# Patient Record
Sex: Female | Born: 1945
Health system: Southern US, Community
[De-identification: ages and names within clinical notes are randomized; demographics above are authoritative.]

## PROBLEM LIST (undated history)

## (undated) DIAGNOSIS — K219 Gastro-esophageal reflux disease without esophagitis: Secondary | ICD-10-CM

## (undated) DIAGNOSIS — M545 Low back pain, unspecified: Secondary | ICD-10-CM

## (undated) DIAGNOSIS — K824 Cholesterolosis of gallbladder: Secondary | ICD-10-CM

## (undated) DIAGNOSIS — K623 Rectal prolapse: Secondary | ICD-10-CM

## (undated) DIAGNOSIS — I480 Paroxysmal atrial fibrillation: Secondary | ICD-10-CM

## (undated) DIAGNOSIS — I10 Essential (primary) hypertension: Secondary | ICD-10-CM

## (undated) DIAGNOSIS — H43813 Vitreous degeneration, bilateral: Secondary | ICD-10-CM

## (undated) DIAGNOSIS — R740 Nonspecific elevation of levels of transaminase and lactic acid dehydrogenase [LDH]: Secondary | ICD-10-CM

## (undated) DIAGNOSIS — K279 Peptic ulcer, site unspecified, unspecified as acute or chronic, without hemorrhage or perforation: Secondary | ICD-10-CM

## (undated) DIAGNOSIS — R55 Syncope and collapse: Secondary | ICD-10-CM

## (undated) DIAGNOSIS — K449 Diaphragmatic hernia without obstruction or gangrene: Secondary | ICD-10-CM

## (undated) DIAGNOSIS — K2960 Other gastritis without bleeding: Secondary | ICD-10-CM

## (undated) HISTORY — DX: Age-related osteoporosis without current pathological fracture: M81.0

## (undated) HISTORY — DX: Syncope and collapse: R55

## (undated) HISTORY — DX: Cholesterolosis of gallbladder: K82.4

## (undated) HISTORY — PX: OOPHORECTOMY: SHX86

## (undated) HISTORY — DX: Peptic ulcer, site unspecified, unspecified as acute or chronic, without hemorrhage or perforation: K27.9

## (undated) HISTORY — DX: Paroxysmal atrial fibrillation: I48.0

## (undated) HISTORY — DX: Diaphragmatic hernia without obstruction or gangrene: K44.9

## (undated) HISTORY — DX: Other gastritis without bleeding: K29.60

## (undated) HISTORY — DX: Gastro-esophageal reflux disease without esophagitis: K21.9

## (undated) HISTORY — DX: Nonspecific elevation of levels of transaminase and lactic acid dehydrogenase (ldh): R74.0

## (undated) HISTORY — DX: Low back pain: M54.5

## (undated) HISTORY — DX: Low back pain, unspecified: M54.50

## (undated) HISTORY — DX: Vitreous degeneration, bilateral: H43.813

## (undated) HISTORY — DX: Rectal prolapse: K62.3

## (undated) HISTORY — DX: Essential (primary) hypertension: I10

## (undated) HISTORY — PX: HEMORRHOID BANDING: SHX5850

---

## 1979-10-04 HISTORY — PX: PARTIAL HYSTERECTOMY: SHX80

## 1995-07-20 ENCOUNTER — Encounter: Payer: Self-pay | Admitting: Family Medicine

## 1995-07-20 LAB — CONVERTED CEMR LAB
Blood Glucose, Fasting: 73 mg/dL
RBC count: 4.46 10*6/uL
WBC, blood: 7.1 10*3/uL

## 2001-09-24 ENCOUNTER — Encounter: Payer: Self-pay | Admitting: Family Medicine

## 2001-09-24 LAB — CONVERTED CEMR LAB: Blood Glucose, Fasting: 79 mg/dL

## 2003-06-20 ENCOUNTER — Encounter: Payer: Self-pay | Admitting: Family Medicine

## 2003-06-20 LAB — CONVERTED CEMR LAB: WBC, blood: 4.2 10*3/uL

## 2003-07-01 ENCOUNTER — Encounter: Admission: RE | Admit: 2003-07-01 | Discharge: 2003-07-01 | Payer: Self-pay | Admitting: Family Medicine

## 2003-07-01 ENCOUNTER — Encounter: Payer: Self-pay | Admitting: Family Medicine

## 2003-08-08 ENCOUNTER — Encounter: Payer: Self-pay | Admitting: Family Medicine

## 2003-08-08 LAB — CONVERTED CEMR LAB: Blood Glucose, Fasting: 81 mg/dL

## 2003-10-04 HISTORY — PX: RECTAL PROLAPSE REPAIR: SHX759

## 2004-04-01 LAB — FECAL OCCULT BLOOD, GUAIAC: Fecal Occult Blood: NEGATIVE

## 2005-01-04 ENCOUNTER — Ambulatory Visit: Payer: Self-pay | Admitting: Unknown Physician Specialty

## 2005-01-17 ENCOUNTER — Ambulatory Visit: Payer: Self-pay | Admitting: Family Medicine

## 2005-01-17 LAB — CONVERTED CEMR LAB
Blood Glucose, Fasting: 92 mg/dL
RBC count: 4.28 10*6/uL
TSH: 1.81 microintl units/mL
WBC, blood: 5 10*3/uL

## 2005-01-20 ENCOUNTER — Ambulatory Visit: Payer: Self-pay | Admitting: Family Medicine

## 2005-03-07 ENCOUNTER — Ambulatory Visit: Payer: Self-pay | Admitting: Unknown Physician Specialty

## 2005-05-06 ENCOUNTER — Ambulatory Visit: Payer: Self-pay | Admitting: Family Medicine

## 2006-01-23 ENCOUNTER — Ambulatory Visit: Payer: Self-pay | Admitting: Family Medicine

## 2006-03-23 ENCOUNTER — Ambulatory Visit: Payer: Self-pay | Admitting: Unknown Physician Specialty

## 2006-04-04 ENCOUNTER — Ambulatory Visit: Payer: Self-pay | Admitting: Family Medicine

## 2006-05-02 ENCOUNTER — Ambulatory Visit: Payer: Self-pay | Admitting: Family Medicine

## 2006-08-03 ENCOUNTER — Ambulatory Visit: Payer: Self-pay | Admitting: Family Medicine

## 2006-10-03 HISTORY — PX: TOTAL ABDOMINAL HYSTERECTOMY: SHX209

## 2006-10-11 ENCOUNTER — Ambulatory Visit: Payer: Self-pay | Admitting: Family Medicine

## 2006-11-20 ENCOUNTER — Ambulatory Visit: Payer: Self-pay | Admitting: Family Medicine

## 2006-12-03 HISTORY — PX: COLONOSCOPY: SHX174

## 2006-12-07 ENCOUNTER — Ambulatory Visit: Payer: Self-pay | Admitting: Internal Medicine

## 2006-12-18 ENCOUNTER — Ambulatory Visit: Payer: Self-pay | Admitting: Internal Medicine

## 2006-12-18 LAB — CONVERTED CEMR LAB
Basophils Relative: 0.5 % (ref 0.0–1.0)
Eosinophils Absolute: 0.3 10*3/uL (ref 0.0–0.6)
Monocytes Absolute: 0.5 10*3/uL (ref 0.2–0.7)
Monocytes Relative: 7.7 % (ref 3.0–11.0)
Platelets: 254 10*3/uL (ref 150–400)
RBC: 4.34 M/uL (ref 3.87–5.11)
RDW: 11.9 % (ref 11.5–14.6)

## 2007-02-02 ENCOUNTER — Ambulatory Visit: Payer: Self-pay | Admitting: Internal Medicine

## 2007-04-02 ENCOUNTER — Ambulatory Visit: Payer: Self-pay | Admitting: Internal Medicine

## 2007-04-04 ENCOUNTER — Ambulatory Visit: Payer: Self-pay | Admitting: Unknown Physician Specialty

## 2007-04-27 ENCOUNTER — Encounter: Payer: Self-pay | Admitting: Internal Medicine

## 2007-04-27 ENCOUNTER — Encounter: Payer: Self-pay | Admitting: Surgical

## 2007-04-27 ENCOUNTER — Ambulatory Visit: Payer: Self-pay | Admitting: Internal Medicine

## 2007-04-27 HISTORY — PX: ESOPHAGOGASTRODUODENOSCOPY: SHX1529

## 2007-10-15 ENCOUNTER — Ambulatory Visit: Payer: Self-pay | Admitting: Internal Medicine

## 2007-11-26 ENCOUNTER — Telehealth: Payer: Self-pay | Admitting: Family Medicine

## 2007-12-12 ENCOUNTER — Encounter: Payer: Self-pay | Admitting: Family Medicine

## 2007-12-12 DIAGNOSIS — I10 Essential (primary) hypertension: Secondary | ICD-10-CM

## 2007-12-17 ENCOUNTER — Ambulatory Visit: Payer: Self-pay | Admitting: Family Medicine

## 2008-02-02 DIAGNOSIS — K623 Rectal prolapse: Secondary | ICD-10-CM | POA: Insufficient documentation

## 2008-04-16 ENCOUNTER — Ambulatory Visit: Payer: Self-pay | Admitting: Unknown Physician Specialty

## 2008-05-06 ENCOUNTER — Telehealth: Payer: Self-pay | Admitting: Internal Medicine

## 2008-06-05 ENCOUNTER — Ambulatory Visit: Payer: Self-pay | Admitting: Family Medicine

## 2008-06-05 LAB — CONVERTED CEMR LAB
ALT: 47 units/L — ABNORMAL HIGH (ref 0–35)
Basophils Relative: 1.8 % (ref 0.0–3.0)
Bilirubin, Direct: 0.1 mg/dL (ref 0.0–0.3)
CO2: 29 meq/L (ref 19–32)
Calcium: 9.3 mg/dL (ref 8.4–10.5)
Cholesterol: 156 mg/dL (ref 0–200)
Eosinophils Relative: 2.1 % (ref 0.0–5.0)
Glucose, Bld: 85 mg/dL (ref 70–99)
LDL Cholesterol: 59 mg/dL (ref 0–99)
MCHC: 35.2 g/dL (ref 30.0–36.0)
Neutrophils Relative %: 56.9 % (ref 43.0–77.0)
Platelets: 221 10*3/uL (ref 150–400)
Potassium: 3.8 meq/L (ref 3.5–5.1)
RDW: 12 % (ref 11.5–14.6)
Sodium: 143 meq/L (ref 135–145)
Total Bilirubin: 0.6 mg/dL (ref 0.3–1.2)
Triglycerides: 76 mg/dL (ref 0–149)

## 2008-06-10 ENCOUNTER — Ambulatory Visit: Payer: Self-pay | Admitting: Family Medicine

## 2008-06-10 DIAGNOSIS — K449 Diaphragmatic hernia without obstruction or gangrene: Secondary | ICD-10-CM

## 2008-06-10 DIAGNOSIS — K2960 Other gastritis without bleeding: Secondary | ICD-10-CM | POA: Insufficient documentation

## 2008-10-03 DIAGNOSIS — I480 Paroxysmal atrial fibrillation: Secondary | ICD-10-CM

## 2008-10-03 HISTORY — DX: Paroxysmal atrial fibrillation: I48.0

## 2008-10-03 HISTORY — PX: CATARACT EXTRACTION: SUR2

## 2008-12-02 ENCOUNTER — Ambulatory Visit: Payer: Self-pay | Admitting: Family Medicine

## 2008-12-07 LAB — CONVERTED CEMR LAB: AST: 29 units/L (ref 0–37)

## 2008-12-08 ENCOUNTER — Ambulatory Visit: Payer: Self-pay | Admitting: Family Medicine

## 2009-03-20 ENCOUNTER — Encounter: Payer: Self-pay | Admitting: Family Medicine

## 2009-05-06 ENCOUNTER — Encounter: Payer: Self-pay | Admitting: Family Medicine

## 2009-06-30 LAB — CONVERTED CEMR LAB: Pap Smear: NORMAL

## 2009-07-08 ENCOUNTER — Ambulatory Visit: Payer: Self-pay | Admitting: Unknown Physician Specialty

## 2009-07-10 ENCOUNTER — Ambulatory Visit: Payer: Self-pay | Admitting: Family Medicine

## 2009-07-10 LAB — CONVERTED CEMR LAB
Bilirubin Urine: NEGATIVE
Specific Gravity, Urine: >=1.03
Urobilinogen, UA: 0.2

## 2009-07-13 ENCOUNTER — Ambulatory Visit: Payer: Self-pay | Admitting: Family Medicine

## 2009-07-13 DIAGNOSIS — IMO0002 Reserved for concepts with insufficient information to code with codable children: Secondary | ICD-10-CM | POA: Insufficient documentation

## 2009-08-07 ENCOUNTER — Inpatient Hospital Stay (HOSPITAL_COMMUNITY): Admission: EM | Admit: 2009-08-07 | Discharge: 2009-08-08 | Payer: Self-pay | Admitting: Emergency Medicine

## 2009-08-07 ENCOUNTER — Ambulatory Visit: Payer: Self-pay | Admitting: Internal Medicine

## 2009-08-07 ENCOUNTER — Encounter (INDEPENDENT_AMBULATORY_CARE_PROVIDER_SITE_OTHER): Payer: Self-pay | Admitting: Internal Medicine

## 2009-08-07 HISTORY — PX: OTHER SURGICAL HISTORY: SHX169

## 2009-08-11 ENCOUNTER — Ambulatory Visit (HOSPITAL_COMMUNITY): Admission: RE | Admit: 2009-08-11 | Discharge: 2009-08-11 | Payer: Self-pay | Admitting: Internal Medicine

## 2009-08-11 ENCOUNTER — Ambulatory Visit: Payer: Self-pay

## 2009-08-11 ENCOUNTER — Ambulatory Visit: Payer: Self-pay | Admitting: Internal Medicine

## 2009-08-11 ENCOUNTER — Encounter: Payer: Self-pay | Admitting: Internal Medicine

## 2009-08-18 ENCOUNTER — Ambulatory Visit: Payer: Self-pay | Admitting: Family Medicine

## 2009-08-21 DIAGNOSIS — Z8711 Personal history of peptic ulcer disease: Secondary | ICD-10-CM

## 2009-08-21 DIAGNOSIS — K219 Gastro-esophageal reflux disease without esophagitis: Secondary | ICD-10-CM | POA: Insufficient documentation

## 2009-08-21 DIAGNOSIS — R079 Chest pain, unspecified: Secondary | ICD-10-CM | POA: Insufficient documentation

## 2009-08-24 ENCOUNTER — Ambulatory Visit: Payer: Self-pay | Admitting: Internal Medicine

## 2009-08-24 DIAGNOSIS — I480 Paroxysmal atrial fibrillation: Secondary | ICD-10-CM | POA: Insufficient documentation

## 2009-09-29 ENCOUNTER — Ambulatory Visit: Payer: Self-pay | Admitting: Family Medicine

## 2009-09-29 LAB — CONVERTED CEMR LAB
BUN: 15 mg/dL (ref 6–23)
CO2: 29 meq/L (ref 19–32)
Calcium: 9.4 mg/dL (ref 8.4–10.5)
Creatinine, Ser: 0.7 mg/dL (ref 0.4–1.2)

## 2009-11-03 ENCOUNTER — Telehealth: Payer: Self-pay | Admitting: Internal Medicine

## 2009-12-01 ENCOUNTER — Ambulatory Visit: Payer: Self-pay | Admitting: Internal Medicine

## 2010-01-13 ENCOUNTER — Ambulatory Visit: Payer: Self-pay | Admitting: Family Medicine

## 2010-03-24 ENCOUNTER — Ambulatory Visit: Payer: Self-pay | Admitting: Internal Medicine

## 2010-03-29 ENCOUNTER — Telehealth: Payer: Self-pay | Admitting: Internal Medicine

## 2010-05-06 ENCOUNTER — Encounter (INDEPENDENT_AMBULATORY_CARE_PROVIDER_SITE_OTHER): Payer: Self-pay | Admitting: *Deleted

## 2010-10-13 ENCOUNTER — Ambulatory Visit: Payer: Self-pay | Admitting: Unknown Physician Specialty

## 2010-10-18 ENCOUNTER — Ambulatory Visit: Payer: Self-pay | Admitting: Unknown Physician Specialty

## 2010-10-31 LAB — CONVERTED CEMR LAB
Albumin: 4.3 g/dL (ref 3.5–5.2)
BUN: 16 mg/dL (ref 6–23)
Basophils Absolute: 0 10*3/uL (ref 0.0–0.1)
CO2: 22 meq/L (ref 19–32)
Calcium: 9.6 mg/dL (ref 8.4–10.5)
Cholesterol: 187 mg/dL (ref 0–200)
Creatinine, Ser: 0.7 mg/dL (ref 0.4–1.2)
Eosinophils Absolute: 0 10*3/uL (ref 0.0–0.7)
Glucose, Bld: 77 mg/dL (ref 70–99)
HCT: 41.7 % (ref 36.0–46.0)
HDL: 81.1 mg/dL (ref >39.00)
Hemoglobin: 14.1 g/dL (ref 12.0–15.0)
Lymphs Abs: 1.2 10*3/uL (ref 0.7–4.0)
MCHC: 33.9 g/dL (ref 30.0–36.0)
Neutro Abs: 6.9 10*3/uL (ref 1.4–7.7)
RDW: 12.4 % (ref 11.5–14.6)
Total Protein: 7.6 g/dL (ref 6.0–8.3)
Triglycerides: 59 mg/dL (ref 0.0–149.0)

## 2010-11-02 NOTE — Assessment & Plan Note (Signed)
Summary: f/u--refill on meds--ch.   History of Present Illness Visit Type: Follow-up Visit Primary GI MD: Stan Head MD [facility] Primary Provider: Shaune Leeks MD Chief Complaint: gastritis follow-up on Nexium History of Present Illness:   She was last seen 1/09. She is doing well without GI symptoms on Nexium 40 mg once daily.         Preventive Screening-Counseling & Management  Alcohol-Tobacco     Smoking Status: never      Drug Use:  no.      Current Medications (verified): 1)  Caltrate 600+d 600-400 Mg-Unit  Tabs (Calcium Carbonate-Vitamin D) .Marland Kitchen.. 1200 Units Daily By Mouth 2)  Nexium 40 Mg  Cpdr (Esomeprazole Magnesium) .... Take 1 Tablet By Mouth Once A Day 3)  Metoprolol Tartrate 50 Mg Tabs (Metoprolol Tartrate) .... Take One By Mouth Two Times A Day 4)  Aspirin Ec 325 Mg Tbec (Aspirin) .... Take One Tablet By Mouth Daily 5)  Lisinopril 10 Mg Tabs (Lisinopril) .... Take One Tablet By Mouth Daily  Allergies (verified): No Known Drug Allergies  Past History:  Past Medical History: ATRIAL FIBRILLATION HYPERTENSION (ICD-401.9) CHEST PAIN (ICD-786.50) BACK PAIN, LUMBAR, WITH RADICULOPATHY (ICD-724.4) HEMORRHOIDS (ICD-455.6) HIATAL HERNIA (ICD-553.3) EROSIVE GASTRITIS (ICD-535.40) RECTAL PROLAPSE (ICD-569.1) PEPTIC ULCER DISEASE (ICD-533.90) ? GERD  Hypokalemia, repleted.   Non-ST-elevation myocardial infarction, likely type 2.   Past Surgical History: Reviewed history from 08/24/2009 and no changes required. PARTIAL HYSTERECTOMY/ BLEEDING//CYST // BLADDER IHKV:(4259) Rectal prolapse repair 2005 DEXA OSTEOPENIA HIP:(03/16/2005) COLONOSCOPY INTERNAL/EXTERNAL HEMORRHOIDS NO POLYPS:(12/03/2006) EGD EROSIVE &ULCERATIVE GASTRITIS; SMALL H.H.:(04/27/2007) CATH  NML (08/07/2009) Total abdominal hysterectomy with a bilateral salpingo-  oophorectomy.  She has also had rectal prolapse repair.   Family History: Reviewed history from 07/13/2009 and no changes  required. Father: A 80  MI X4 STENTS ; PACER ;HTN; MINISTROKES Mother: A 18  HTN COPD (prior smoker) CAD Stents BROTHER A 48  TESTICLE CANCER           SISTER A 57   CV: +CAD FATHER MI X4 HBP: +FATHER; +MOTHER DM:+ SON GOUT/ARTHRITIS: PROSTATE CANCER: +BROTHER TESTICLE CANCER BREAST/OVARIAN/UTERINE CANCER: COLON CANCER: Father DEPRESSION: ETOH/DRUG ABUSE OTHER: +MINI STROKES FATHER  Social History: Reviewed history from 06/10/2008 and no changes required. Marital Status: Married LIVES WITH HUSBAND Children: 2 CHILDRENHTN Occupation: [employer] Patient has never smoked.  Alcohol Use - no Illicit Drug Use - no Daily Caffeine Use  Vital Signs:  Patient profile:   65 year old female Height:      64.75 inches Weight:      120 pounds BMI:     20.20 Pulse rate:   68 / minute Pulse rhythm:   regular BP sitting:   130 / 60  (left arm)  Vitals Entered By: Milford Cage NCMA (December 01, 2009 4:30 PM)  Physical Exam  General:  Well developed, well nourished, no acute distress.   Impression & Recommendations:  Problem # 1:  EROSIVE GASTRITIS (ICD-535.40) Assessment Unchanged She is doing well on Nexium and will continue. Some of her epigastric sxs could have been from GERD but she had epigastric pain and erosive gastritis, not endoscopic GERD at EGD in 2008.  Patient Instructions: 1)  Please continue current medications.  2)  Please pick up your medications at your pharmacy. 3)  Your Nexium has been refilled and you should take this long-term as discussed. 4)  We can refill it here in a year but if no gastrointestinal problems your primary care physician could refill also  if desired. 5)  The medication list was reviewed and reconciled.  All changed / newly prescribed medications were explained.  A complete medication list was provided to the patient / caregiver. Prescriptions: NEXIUM 40 MG  CPDR (ESOMEPRAZOLE MAGNESIUM) Take 1 tablet by mouth once a day  #30 x 11   Entered  and Authorized by:   Iva Boop MD, [facility]   Signed by:   Iva Boop MD, FACG on 12/01/2009   Method used:   [facility]        [facility]  [address] [facility]ail[facility]       [address]       [place], [place]  [address]       Ph: [phone] or [phone]       Fax: [phone]   RxID:   [id number]

## 2010-11-02 NOTE — Assessment & Plan Note (Signed)
Summary: 6 MONTH FOLLOW UP/[facility]   Vital Signs:  Patient profile:   65 year old female Weight:      122 pounds Temp:     98.1 degrees F oral Pulse rate:   60 / minute Pulse rhythm:   regular BP sitting:   104 / 64  (left arm) Cuff size:   regular  Vitals Entered By: Sydell Axon LPN (January 13, 2010 4:04 PM) CC: 6 month follow-up   History of Present Illness: Pt here for recheck of BP, Afib and heartburn. She is doing well, had good reports from both Dr Johney Frame and Dr Leone Payor and feels well. She drinks three cups of coffee a day typically.  Problems Prior to Update: 1)  Atrial Fibrillation  (ICD-427.31) 2)  Hypertension  (ICD-401.9) 3)  Chest Pain  (ICD-786.50) 4)  Back Pain, Lumbar, With Radiculopathy  (ICD-724.4) 5)  Other Abnormal Blood Chemistry, Sgot, Sgpt  (ICD-790.6) 6)  Routine General Medical Exam@health  Care Facl  (ICD-V70.0) 7)  Hemorrhoids  (ICD-455.6) 8)  Hiatal Hernia  (ICD-553.3) 9)  Erosive Gastritis  (ICD-535.40) 10)  Rectal Prolapse  (ICD-569.1) 11)  Peptic Ulcer Disease  (ICD-533.90) 12)  Gerd  (ICD-530.81)  Medications Prior to Update: 1)  Caltrate 600+d 600-400 Mg-Unit  Tabs (Calcium Carbonate-Vitamin D) .Marland Kitchen.. 1200 Units Daily By Mouth 2)  Nexium 40 Mg  Cpdr (Esomeprazole Magnesium) .... Take 1 Tablet By Mouth Once A Day 3)  Metoprolol Tartrate 50 Mg Tabs (Metoprolol Tartrate) .... Take One By Mouth Two Times A Day 4)  Aspirin Ec 325 Mg Tbec (Aspirin) .... Take One Tablet By Mouth Daily 5)  Lisinopril 10 Mg Tabs (Lisinopril) .... Take One Tablet By Mouth Daily  Allergies: No Known Drug Allergies  Physical Exam  General:  Well-developed,well-nourished,in no acute distress; alert,appropriate and cooperative throughout examination Head:  Normocephalic and atraumatic without obvious abnormalities. No apparent alopecia or balding. Eyes:  Conjunctiva clear bilaterally.  Ears:  External ear exam shows no significant lesions or deformities.  Otoscopic  examination reveals clear canals, tympanic membranes are intact bilaterally without bulging, retraction, inflammation or discharge. Hearing is grossly normal bilaterally. Minimal cerumen today. Nose:  External nasal examination shows no deformity or inflammation. Nasal mucosa are pink and moist without lesions or exudates. Mouth:  Oral mucosa and oropharynx without lesions or exudates.  Teeth in good repair. Neck:  No deformities, masses, or tenderness noted. Chest Wall:  No deformities, masses, or tenderness noted. Lungs:  Normal respiratory effort, chest expands symmetrically. Lungs are clear to auscultation, no crackles or wheezes. Heart:  Normal rate and regular rhythm. S1 and S2 normal without gallop, murmur, click, rub or other extra sounds.   Impression & Recommendations:  Problem # 1:  HYPERTENSION (ICD-401.9) Assessment Improved Great control. Cont curr meds. Her updated medication list for this problem includes:    Metoprolol Tartrate 50 Mg Tabs (Metoprolol tartrate) .Marland Kitchen... Take one by mouth two times a day    Lisinopril 10 Mg Tabs (Lisinopril) .Marland Kitchen... Take one tablet by mouth daily  BP today: 104/64 Prior BP: 130/60 (12/01/2009)  Labs Reviewed: K+: 4.3 (09/29/2009) Creat: : 0.7 (09/29/2009)   Chol: 187 (07/10/2009)   HDL: 81.10 (07/10/2009)   LDL: 94 (07/10/2009)   TG: 59.0 (07/10/2009)  Problem # 2:  ATRIAL FIBRILLATION (ICD-427.31) Assessment: Unchanged Curr still NSR by auscultation. Discussed the effect of caffeine and the fact she really ought to try to decrease her intake, buit do it slowly. Her updated medication list for this problem  includes:    Metoprolol Tartrate 50 Mg Tabs (Metoprolol tartrate) .Marland Kitchen... Take one by mouth two times a day    Aspirin Ec 325 Mg Tbec (Aspirin) .Marland Kitchen... Take one tablet by mouth daily  Problem # 3:  EROSIVE GASTRITIS (ICD-535.40) Assessment: Unchanged  Stable. Will help this by decreasing caffeine as well. Her updated medication list for  this problem includes:    Nexium 40 Mg Cpdr (Esomeprazole magnesium) .Marland Kitchen... Take 1 tablet by mouth once a day  Discussed use of medication, as well as lifestyle changes.   Complete Medication List: 1)  Caltrate 600+d 600-400 Mg-unit Tabs (Calcium carbonate-vitamin d) .Marland Kitchen.. 1200 units daily by mouth 2)  Nexium 40 Mg Cpdr (Esomeprazole magnesium) .... Take 1 tablet by mouth once a day 3)  Metoprolol Tartrate 50 Mg Tabs (Metoprolol tartrate) .... Take one by mouth two times a day 4)  Aspirin Ec 325 Mg Tbec (Aspirin) .... Take one tablet by mouth daily 5)  Lisinopril 10 Mg Tabs (Lisinopril) .... Take one tablet by mouth daily  Patient Instructions: 1)  Call in May for appt in Oct or Nov.  Current Allergies (reviewed today): No known allergies

## 2010-11-02 NOTE — Progress Notes (Signed)
Summary: Medication making pt sick  Phone Note Call from Patient Call back at Home Phone [phone]   Caller: Patient Summary of Call: Pt calling regarding the medication Diltiazem this medication is making the pt sick  Initial call taken by: Judie Grieve,  March 29, 2010 9:28 AM  Follow-up for Phone Call        lmom for pt to call me back Dennis Bast, RN, BSN  March 29, 2010 2:34 PM has had nausea and a HA since taking the Cardizem she d/c the med and went back on Metoprolol 50mg  two times a day    New/Updated Medications: METOPROLOL TARTRATE 50 MG TABS (METOPROLOL TARTRATE) Take one tablet by mouth twice a day

## 2010-11-02 NOTE — Letter (Signed)
Summary: Nadara Eaton letter  [place] [facility]  [address] [place], [place] [address]   Phone: [phone]  Fax: [phone]       05/06/2010 MRN: [id number]  [facility] [address] [place], [place]  [address]  Dear Ms. [facility] [place] and [place] announce the retirement of Arta Silence, M.D., from full-time practice at the [facility] office effective April 01, 2010 and his plans of returning part-time.  It is important to Dr. Hetty Ely and to our practice that you understand that [facility] has seven physicians in our office for your health care needs.  We will continue to offer the same exceptional care that you have today.    Dr. Hetty Ely has spoken to many of you about his plans for retirement and returning part-time in the fall.   We will continue to work with you through the transition to schedule appointments for you in the office and meet the high standards that [facility] is committed to.   Again, it is with great pleasure that we share the news that Dr. Hetty Ely will return to [facility] in October of 2011 with a reduced schedule.    If you have any questions, or would like to request an appointment with one of our physicians, please call us at [phone] and press the option for Scheduling an appointment.  We take pleasure in providing you with excellent patient care and look forward to seeing you at your next office visit.  Our [facility] Physicians are:  Tillman Abide, M.D. Laurita Quint, M.D. Roxy Manns, M.D. Kerby Nora, M.D. Hannah Beat, M.D. Ruthe Mannan, M.D. We proudly welcomed Raechel Ache, M.D. and Eustaquio Boyden, M.D. to the practice in July/August 2011.  Sincerely,  [facility]

## 2010-11-02 NOTE — Progress Notes (Signed)
Summary: Medication Refill-Nexium  Phone Note Call from Patient Call back at [facility] Phone [phone]   Caller: Patient Call For: Dr. Leone Payor Reason for Call: Refill Medication Summary of Call: Pt. is needing a refill on her Nexium. Scheduled an appt. for 12-01-09 and completely out of meds. Initial call taken by: Karna Christmas,  November 03, 2009 1:49 PM  Follow-up for Phone Call        notified pt refill sent to pharmacy. Follow-up by: Francee Piccolo CMA Duncan Dull),  November 03, 2009 4:43 PM    New/Updated Medications: NEXIUM 40 MG  CPDR (ESOMEPRAZOLE MAGNESIUM) Take 1 tablet by mouth once a day Prescriptions: NEXIUM 40 MG  CPDR (ESOMEPRAZOLE MAGNESIUM) Take 1 tablet by mouth once a day  #30 x 1   Entered by:   Francee Piccolo CMA (AAMA)   Authorized by:   Iva Boop MD, [facility]   Signed by:   Francee Piccolo CMA (AAMA) on 11/03/2009   Method used:   [facility]  [address] [facility]ail)       [address]       [place], [place]  [address]       Ph: [phone] or [phone]       Fax: [phone]   RxID:   [id number]

## 2010-11-02 NOTE — Assessment & Plan Note (Signed)
Summary: 6 month rov/sl   Primary Provider:  Shaune Leeks MD  CC:  fatigue.  History of Present Illness: The patient presents today for routine electrophysiology followup. She reports doing very well since last being seen in our clinic. She is unaware of any further afib.  She reports fatigue in the afternoons.  She does not feel well rested upon waking but does not snore.  The patient denies symptoms of palpitations, chest pain, shortness of breath, orthopnea, PND, lower extremity edema, dizziness, presyncope, syncope, or neurologic sequela. The patient is tolerating medications without difficulties and is otherwise without complaint today.   Current Medications (verified): 1)  Caltrate 600+d 600-400 Mg-Unit  Tabs (Calcium Carbonate-Vitamin D) .Marland Kitchen.. 1200 Units Daily By Mouth 2)  Nexium 40 Mg  Cpdr (Esomeprazole Magnesium) .... Take 1 Tablet By Mouth Once A Day 3)  Metoprolol Tartrate 50 Mg Tabs (Metoprolol Tartrate) .... Take One By Mouth Two Times A Day 4)  Aspirin Ec 325 Mg Tbec (Aspirin) .... Take One Tablet By Mouth Daily 5)  Lisinopril 10 Mg Tabs (Lisinopril) .... Take One Tablet By Mouth Daily  Allergies (verified): No Known Drug Allergies  Past History:  Past Medical History: PAROXYSMAL ATRIAL FIBRILLATION HYPERTENSION (ICD-401.9) CHEST PAIN (ICD-786.50) BACK PAIN, LUMBAR, WITH RADICULOPATHY (ICD-724.4) HEMORRHOIDS (ICD-455.6) HIATAL HERNIA (ICD-553.3) EROSIVE GASTRITIS (ICD-535.40) RECTAL PROLAPSE (ICD-569.1) PEPTIC ULCER DISEASE (ICD-533.90) ? GERD  Hypokalemia, repleted.   Non-ST-elevation myocardial infarction, likely type 2.   Past Surgical History: Reviewed history from 08/24/2009 and no changes required. PARTIAL HYSTERECTOMY/ BLEEDING//CYST // BLADDER ZOXW:(9604) Rectal prolapse repair 2005 DEXA OSTEOPENIA HIP:(03/16/2005) COLONOSCOPY INTERNAL/EXTERNAL HEMORRHOIDS NO POLYPS:(12/03/2006) EGD EROSIVE &ULCERATIVE GASTRITIS; SMALL H.H.:(04/27/2007) CATH   NML (08/07/2009) Total abdominal hysterectomy with a bilateral salpingo-  oophorectomy.  She has also had rectal prolapse repair.   Vital Signs:  Patient profile:   65 year old female Height:      64.75 inches Weight:      122 pounds BMI:     20.53 Pulse rate:   56 / minute Resp:     16 per minute BP sitting:   132 / 74  (left arm)  Vitals Entered By: Marrion Coy, CNA (March 24, 2010 12:06 PM)  Physical Exam  General:  Well developed, well nourished, in no acute distress. Head:  normocephalic and atraumatic Mouth:  Teeth, gums and palate normal. Oral mucosa normal. Neck:  Neck supple, no JVD. No masses, thyromegaly or abnormal cervical nodes. Lungs:  Clear bilaterally to auscultation and percussion. Heart:  Non-displaced PMI, chest non-tender; regular rate and rhythm, S1, S2 without murmurs, rubs or gallops. Carotid upstroke normal, no bruit. Normal abdominal aortic size, no bruits. Femorals normal pulses, no bruits. Pedals normal pulses. No edema, no varicosities. Abdomen:  Bowel sounds positive; abdomen soft and non-tender without masses, organomegaly, or hernias noted. No hepatosplenomegaly. Msk:  Back normal, normal gait. Muscle strength and tone normal. Pulses:  pulses normal in all 4 extremities Extremities:  No clubbing or cyanosis. Neurologic:  Alert and oriented x 3.   EKG  Procedure date:  03/24/2010  Findings:      sinus bradycardia 56 bpm, otherwise normal ekg  Impression & Recommendations:  Problem # 1:  ATRIAL FIBRILLATION (ICD-427.31) Doing well.  NO further symptomatic afib, off AAD.  Her fatigue may be related to metoprolol.  I will therefore stop metoprolol and start cardizem DC 180mg  daily today.  IF her symptoms do not improve, she may benefit from further workup (ie TSH, sleep study) by  PCP. Her CHADS2 score is 1.  We discussed ASA, coumadin, and pradaxa again today.  She is clear in her desire to continue coumadin at this time.  I think that this is a  reasonable option, particularly given her h/o GI ulcers.  Problem # 2:  HYPERTENSION (ICD-401.9) stable stop metoprolol and start cardizem as above  Patient Instructions: 1)  Your physician recommends that you schedule a follow-up appointment in: 12 months with Dr Johney Frame 2)  Your physician has recommended you make the following change in your medication: stop Metoprolol and start Cardizem CD 180mg  daily Prescriptions: CARDIZEM CD 180 MG XR24H-CAP (DILTIAZEM HCL COATED BEADS) one by mouth once daily  #30 x 11   Entered by:   Dennis Bast, RN, BSN   Authorized by:   Hillis Range, MD   Signed by:   Dennis Bast, RN, BSN on 03/24/2010   Method used:   [facility]        [facility]  [address] [facility]       [address]       [place], [place]  [address]       Ph: [phone] or [phone]       Fax: [phone]   RxID:   [id number]

## 2011-01-05 LAB — CBC
HCT: 34.2 % — ABNORMAL LOW (ref 36.0–46.0)
HCT: 41.6 % (ref 36.0–46.0)
Hemoglobin: 14.2 g/dL (ref 12.0–15.0)
MCHC: 35.1 g/dL (ref 30.0–36.0)
MCV: 92.3 fL (ref 78.0–100.0)
Platelets: 199 10*3/uL (ref 150–400)
Platelets: 255 10*3/uL (ref 150–400)
RBC: 4.48 MIL/uL (ref 3.87–5.11)
RDW: 13.5 % (ref 11.5–15.5)
WBC: 4.6 10*3/uL (ref 4.0–10.5)
WBC: 5.1 10*3/uL (ref 4.0–10.5)

## 2011-01-05 LAB — BASIC METABOLIC PANEL
BUN: 12 mg/dL (ref 6–23)
Chloride: 113 mEq/L — ABNORMAL HIGH (ref 96–112)
Creatinine, Ser: 0.73 mg/dL (ref 0.4–1.2)
GFR calc Af Amer: >60 mL/min (ref >60)
GFR calc non Af Amer: >60 mL/min (ref >60)
Glucose, Bld: 90 mg/dL (ref 70–99)
Potassium: 3.2 mEq/L — ABNORMAL LOW (ref 3.5–5.1)
Potassium: 3.6 mEq/L (ref 3.5–5.1)
Sodium: 144 mEq/L (ref 135–145)

## 2011-01-05 LAB — CK TOTAL AND CKMB (NOT AT ARMC)
Relative Index: 5.7 — ABNORMAL HIGH (ref 0.0–2.5)
Relative Index: INVALID (ref 0.0–2.5)
Total CK: 112 U/L (ref 7–177)
Total CK: 114 U/L (ref 7–177)

## 2011-01-05 LAB — POCT CARDIAC MARKERS
Myoglobin, poc: 69.7 ng/mL (ref 12–200)
Troponin i, poc: 0.12 ng/mL — ABNORMAL HIGH (ref 0.00–0.09)

## 2011-01-05 LAB — LIPID PANEL
Cholesterol: 176 mg/dL (ref 0–200)
HDL: 81 mg/dL (ref >39)
Triglycerides: 45 mg/dL (ref <150)

## 2011-01-05 LAB — DIFFERENTIAL
Eosinophils Relative: 1 % (ref 0–5)
Lymphocytes Relative: 30 % (ref 12–46)
Lymphs Abs: 1.4 10*3/uL (ref 0.7–4.0)
Monocytes Absolute: 0.3 10*3/uL (ref 0.1–1.0)
Monocytes Relative: 7 % (ref 3–12)

## 2011-01-05 LAB — TROPONIN I
Troponin I: 0.38 ng/mL — ABNORMAL HIGH (ref 0.00–0.06)
Troponin I: 0.62 ng/mL (ref 0.00–0.06)

## 2011-01-05 LAB — D-DIMER, QUANTITATIVE: D-Dimer, Quant: <0.22 ug/mL-FEU (ref 0.00–0.48)

## 2011-01-05 LAB — TSH: TSH: 1.486 u[IU]/mL (ref 0.350–4.500)

## 2011-02-15 NOTE — Assessment & Plan Note (Signed)
[facility]                         GASTROENTEROLOGY OFFICE NOTE   NDEA, KILROY                       MRN:          [id number]  DATE:04/02/2007                            DOB:          06/16/1946    CHIEF COMPLAINT:  Epigastric pain.   PROBLEM LIST:  1. History of epigastric pain, previously responsive to Nexium.  2. Hypertension.  3. Prior hysterectomy.  4. Prior oophorectomy, bilateral.  5. Rectal prolapse repair, 2006, [employer].  6. Family history of colon polyps in father after age 78.  The      patient's screening colonoscopy was notable for mixed hemorrhoids,      otherwise normal, Feb 02, 2007.  7. H. pylori serology negative, March 2008, and normal CBC at that      time.   MEDICATIONS:  1. Premarin 0.45 mg daily.  2. Toprol XL 25 mg daily.  3. Calcium 1200 mg plus vitamin D daily.   No known drug allergies.   She did well while on Nexium.  Since she stopped it a month or so ago,  she has had recurrent epigastric pain, sometimes post prandial.  There  is no classic heartburn.  No nausea, vomiting, dysphagia, or weight  loss.  Her weight is down 2 pounds, but I do not think that is much  different from before.  Previous ultrasound negative per patient report.   SOCIAL/FAMILY HISTORY:  Unchanged from December 07, 2006.  See that note.   PHYSICAL EXAMINATION:  Reveals a well-developed, middle-aged white woman  in no acute distress.  Weight 119.6 pounds, pulse 60, blood pressure 102/72.  EYES:  Anicteric.  ABDOMEN:  Soft, minimally tender in the epigastric area.  There is no  rib tenderness.   ASSESSMENT:  Recurrent epigastric pain.  She is 65.  There are no  worrisome features, but since it recurred off of proton pump inhibitor,  I think an upper endoscopy to exclude more serious causes such as  neoplasm is warranted.  Risks, benefits and indications are described  and she understands and agrees to proceed with upper GI  endoscopy.  She  can use antacids in  the interim.  I would like to withhold proton pump inhibitor therapy to  see what the mucosa looks like without it.     Iva Boop, MD,FACG  Electronically Signed    CEG/MedQ  DD: 04/02/2007  DT: 04/02/2007  Job #: [id number]   cc:   Arta Silence, MD

## 2011-02-18 NOTE — Assessment & Plan Note (Signed)
[facility]                         GASTROENTEROLOGY OFFICE NOTE   Alexis Bentley, Alexis Bentley                       MRN:          [id number]  DATE:10/15/2007                            DOB:          1945-11-29    CHIEF COMPLAINT:  Epigastric pain.   Her epigastric pain has returned for a month.  It is sort of a nagging  pain mainly in the evening and at bedtime.  It is occasional.  It may be  if she eats too much.  She cannot pinpoint what particular food causes  it.  There has been no significant weight loss or change, otherwise.   MEDICATIONS:  Listed and reviewed in the chart.  She remains on Nexium  which had helped her.   PAST MEDICAL HISTORY:  Reviewed and unchanged.   PHYSICAL EXAMINATION:  VITAL SIGNS:  Weight 127 pounds, pulse 64, blood  pressure 120/66.  ABDOMEN:  Soft, nontender, and benign.   ASSESSMENT:  Epigastric pain and dyspepsia, persistent.  It is not as  bad as it was.  The Nexium seems to be helping.  Perhaps it is due to  gaseous foods and roughage.  A gas diet sheet will be given to the  patient.  She will start diet modifications and a diary and we will see  if that helps her.  She will follow up as needed.     Iva Boop, MD,FACG  Electronically Signed    CEG/MedQ  DD: 10/16/2007  DT: 10/16/2007  Job #: [id number]   cc:   Arta Silence, MD

## 2011-02-18 NOTE — Assessment & Plan Note (Signed)
[facility]                         GASTROENTEROLOGY OFFICE NOTE   Alexis Bentley, Alexis Bentley                       MRN:          [id number]  DATE:12/07/2006                            DOB:          Jan 24, 1946    CHIEF COMPLAINT:  Epigastric pain.   REQUESTING PHYSICIAN:  Arta Silence, M.D.   ASSESSMENT:  A 65 year old white woman who presented with epigastric  pain several weeks ago.  She saw Dr. Hetty Ely on February 18 at which  point she had had problems for a few weeks.  It was a midepigastric  pain, intermittent, getting worse at that time.  Sometimes related to  eating and she had a little bit of pressure in the midsternal area.  He  started her on Nexium and the midsternal pressure is completely gone.  The epigastric pain is much better.  No nausea or vomiting or dysphagia  or weight or bleeding.  Ultrasound was performed in [place] which she  tells me was normal.  GI review of systems is otherwise negative.   PAST MEDICAL HISTORY:  1. Hypertension.  2. Hysterectomy.  3. Subsequent oophorectomy, bilateral.  4. Surgery for prolapsed rectum 2 years ago at [facility].   Father had colon polyps after he was 35, son has diabetes, father has  had heart disease.   SOCIAL HISTORY:  The patient is married.  She works for [employer].  Two  sons.  No alcohol, tobacco, or drugs.   REVIEW OF SYSTEMS:  All I review is negative.   PHYSICAL EXAMINATION:  GENERAL:  Reveals a petite, well-developed white  woman in no acute distress.  VITAL SIGNS:  Height 5 feet 6 inches, weight 121 pounds, blood pressure  132/72, pulse 72.  EYES:  Anicteric.  ENT:  Normal mouth and posterior pharynx.  NECK:  Supple, no thyromegaly or mass.  CHEST:  Clear.  HEART:  S1, S2.  No murmurs or gallops.  ABDOMEN:  Soft, nontender, no organomegaly or mass.  There are some  surgical scars present.  RECTAL:  Deferred.  LYMPHATIC:  No neck or supraclavicular nodes.  EXTREMITIES:  No edema.  SKIN:  She is slightly dry but no acute rash.  PSYCHIATRIC:  She is alert and oriented x3.   ASSESSMENT:  1. Epigastric pain that is improving on proton pump inhibitor therapy.  2. She is of age for colon cancer screening and has not yet had a      colonoscopy.  3. Note there are no worrisome features related to this epigastric      pain.   PLAN:  1. Check a CBC with diff.  If she were anemic, an upper endoscopy      could be indicated.  2. Go ahead and check an H. pylori serum antibody.  If negative, that      is fairly reliable for absence of disease.  If it is positive, I      would likely test her with a stool antigen off of PPI.  3. Continue Nexium for 6-8 weeks.  She is going on a cruise in mid  April and was warned that she is at greater risk of infectious      gastroenteritis-type problems while on a PPI therapy.  However, if      she needs it for symptoms, she should take it.  4. Schedule screening colonoscopy.  Risks, benefits and indications      are explained.  She understands and agrees to proceed.   I appreciate the opportunity to care for this patient.     Iva Boop, MD,FACG  Electronically Signed    CEG/MedQ  DD: 12/07/2006  DT: 12/07/2006  Job #: [id number]   cc:   Arta Silence, MD

## 2011-02-22 ENCOUNTER — Other Ambulatory Visit: Payer: Self-pay | Admitting: Internal Medicine

## 2011-02-26 ENCOUNTER — Other Ambulatory Visit: Payer: Self-pay | Admitting: *Deleted

## 2011-02-26 MED ORDER — LISINOPRIL 10 MG PO TABS: 10.0000 mg | ORAL_TABLET | Freq: Every day | ORAL | Status: DC

## 2011-03-09 ENCOUNTER — Encounter: Payer: Self-pay | Admitting: Internal Medicine

## 2011-04-05 ENCOUNTER — Encounter: Payer: Self-pay | Admitting: Internal Medicine

## 2011-04-07 ENCOUNTER — Ambulatory Visit (INDEPENDENT_AMBULATORY_CARE_PROVIDER_SITE_OTHER): Payer: 59 | Admitting: Internal Medicine

## 2011-04-07 ENCOUNTER — Encounter: Payer: Self-pay | Admitting: Internal Medicine

## 2011-04-07 VITALS — BP 154/75 | HR 65 | Resp 16 | Ht 66.0 in | Wt 123.0 lb

## 2011-04-07 DIAGNOSIS — I4891 Unspecified atrial fibrillation: Secondary | ICD-10-CM

## 2011-04-07 DIAGNOSIS — I10 Essential (primary) hypertension: Secondary | ICD-10-CM

## 2011-04-07 DIAGNOSIS — R079 Chest pain, unspecified: Secondary | ICD-10-CM

## 2011-04-07 LAB — BASIC METABOLIC PANEL
BUN: 20 mg/dL (ref 6–23)
CO2: 28 mEq/L (ref 19–32)
Calcium: 9.3 mg/dL (ref 8.4–10.5)
Chloride: 104 mEq/L (ref 96–112)
Creatinine, Ser: 0.7 mg/dL (ref 0.4–1.2)
Glucose, Bld: 91 mg/dL (ref 70–99)

## 2011-04-07 MED ORDER — LISINOPRIL 20 MG PO TABS: 20.0000 mg | ORAL_TABLET | Freq: Every day | ORAL | Status: DC

## 2011-04-07 MED ORDER — METOPROLOL TARTRATE 50 MG PO TABS: 50.0000 mg | ORAL_TABLET | Freq: Two times a day (BID) | ORAL | Status: DC

## 2011-04-07 NOTE — Assessment & Plan Note (Signed)
The patient is quite concerned about her nocturnal chest pain.  Given her normal cath 2010 and h/o GERD, I suspect this is GI in origin.  Nevertheless, I think that further cardiac risk stratefication with GXT myoview is reasonable at this time.  If normal or low risk, no further CV testing would be required.

## 2011-04-07 NOTE — Assessment & Plan Note (Signed)
Doing well without recurrence We have discussed ASA vs coumadin/ pradaxa (her chads2 score is 1).  She is clear that she wishes to continue ASA at this time.

## 2011-04-07 NOTE — Patient Instructions (Signed)
Your physician recommends that you have lab work today: bmp/magnesium (427.31).  Your physician has recommended you make the following change in your medication:  1) Increase lisinopril to 20mg  one tablet by mouth once daily.  Your physician has requested that you have en exercise stress myoview. For further information please visit [email or web address] Please follow instruction sheet, as given.  Your physician wants you to follow-up in: 1 year. You will receive a reminder letter in the mail two months in advance. If you don't receive a letter, please call our office to schedule the follow-up appointment.

## 2011-04-07 NOTE — Progress Notes (Signed)
The patient presents today for routine electrophysiology followup.  Since last being seen in our clinic, the patient reports doing very well.  She is unaware of any further afib.  She does however report occasional chest pain during the night which wakes her from sleep.  She is unaware of any triggers/ precipitants for her pain.  She denies exertional symptoms.   She also reports occasional leg cramping.   Today, she denies symptoms of palpitations,  shortness of breath, orthopnea, PND, lower extremity edema, dizziness, presyncope, syncope, or neurologic sequela.  The patient feels that she is tolerating medications without difficulties and is otherwise without complaint today.   Past Medical History  Diagnosis Date  . Paroxysmal atrial fibrillation   . Hypertension   . Chest pain   . Lumbar back pain     With radiculopathy  . Hemorrhoids   . Hiatal hernia   . Erosive gastritis   . Rectal prolapse   . Peptic ulcer disease   . GERD (gastroesophageal reflux disease)   . Hypokalemia     Repleted   Past Surgical History  Procedure Date  . Partial hysterectomy 1981    Bleeding, cyst, bladder tack  . Rectal prolapse repair 2005  . Dexa osteopenia     Hip  . Colonoscopy 12/03/2006    Internal/ external hemorrhoids, no polyps  . Esophagogastroduodenoscopy 04/27/2007    Erosive and ulcerative gastritis; small H.H.  . Coronary angioplasty 08/07/2009    NML  . Total abdominal hysterectomy     With a bilateral salpingo- oophorectomy    Current Outpatient Prescriptions  Medication Sig Dispense Refill  . aspirin 325 MG EC tablet Take 325 mg by mouth daily.        . Calcium Carbonate-Vitamin D (CALTRATE 600+D) 600-400 MG-UNIT per tablet Take 2 tablets by mouth daily.        Marland Kitchen esomeprazole (NEXIUM) 40 MG capsule Take 40 mg by mouth daily before breakfast.        . lisinopril (PRINIVIL,ZESTRIL) 10 MG tablet Take 1 tablet (10 mg total) by mouth daily.  30 tablet  5  . metoprolol (LOPRESSOR) 50  MG tablet Take 50 mg by mouth 2 (two) times daily.          No Known Allergies  History   Social History  . Marital Status: Married    Spouse Name: N/A    Number of Children: N/A  . Years of Education: N/A   Occupational History  .  [employer]   Social History Main Topics  . Smoking status: Never Smoker   . Smokeless tobacco: Not on file  . Alcohol Use: No  . Drug Use: No  . Sexually Active: Not on file   Other Topics Concern  . Not on file   Social History Narrative   MarriedLives with husband2 childrenDaily caffeine use    Family History  Problem Relation Age of Onset  . Hypertension Mother   . COPD Mother     Prior smoker  . Coronary artery disease Mother   . Other Mother     HBP  . Heart attack Father     MI, x4 stents, Pacer  . Hypertension Father   . Stroke Father     Ministrokes  . Coronary artery disease Father   . Other Father     HBP  . Colon cancer Father   . Testicular cancer Brother   . Prostate cancer Brother   . Diabetes type II Son  ROS-  All systems are reviewed and are negative except as outlined in the HPI above    Physical Exam: Filed Vitals:   04/07/11 0850  BP: 154/75  Pulse: 65  Resp: 16  Height: 5\' 6"  (1.676 m)  Weight: 123 lb (55.792 kg)    GEN- The patient is well appearing, alert and oriented x 3 today.   Head- normocephalic, atraumatic Eyes-  Sclera clear, conjunctiva pink Ears- hearing intact Oropharynx- clear Neck- supple, no JVP Lymph- no cervical lymphadenopathy Lungs- Clear to ausculation bilaterally, normal work of breathing Heart- Regular rate and rhythm, no murmurs, rubs or gallops, PMI not laterally displaced GI- soft, NT, ND, + BS Extremities- no clubbing, cyanosis, or edema MS- no significant deformity or atrophy Skin- no rash or lesion Psych- euthymic mood, full affect Neuro- strength and sensation are intact  ekg today reveals sinus rhythm 60 bpm, normal ekg  Assessment and Plan:

## 2011-04-07 NOTE — Assessment & Plan Note (Signed)
Above goal Increase lisinopril to 20mg  daily at this time We will check BMET and magnesium today given recent leg cramps

## 2011-05-05 ENCOUNTER — Encounter: Payer: Self-pay | Admitting: *Deleted

## 2011-05-16 ENCOUNTER — Ambulatory Visit (HOSPITAL_COMMUNITY): Payer: 59 | Attending: Internal Medicine | Admitting: Radiology

## 2011-05-16 DIAGNOSIS — R0789 Other chest pain: Secondary | ICD-10-CM

## 2011-05-16 DIAGNOSIS — I10 Essential (primary) hypertension: Secondary | ICD-10-CM | POA: Insufficient documentation

## 2011-05-16 DIAGNOSIS — I4891 Unspecified atrial fibrillation: Secondary | ICD-10-CM | POA: Insufficient documentation

## 2011-05-16 DIAGNOSIS — R079 Chest pain, unspecified: Secondary | ICD-10-CM | POA: Insufficient documentation

## 2011-05-16 MED ORDER — TECHNETIUM TC 99M TETROFOSMIN IV KIT
11.0000 | PACK | Freq: Once | INTRAVENOUS | Status: AC | PRN
  Administered 2011-05-16: 11 via INTRAVENOUS

## 2011-05-16 MED ORDER — TECHNETIUM TC 99M TETROFOSMIN IV KIT
33.0000 | PACK | Freq: Once | INTRAVENOUS | Status: AC | PRN
  Administered 2011-05-16: 33 via INTRAVENOUS

## 2011-05-16 NOTE — Progress Notes (Signed)
[facility] SITE 3 NUCLEAR MED [address] [place] [address] [phone]  Cardiology Nuclear Med Study  Alexis Bentley is a 65 y.o. female [id number] 1945-10-30   Nuclear Med Background Indication for Stress Test:  Evaluation for Ischemia History: 09/10 Echo: EF 65-70%, 2010 Heart Catheterization: NL EF 65% and AFIB Cardiac Risk Factors: Family History - CAD and Hypertension  Symptoms:  Chest Pain, Chest Tightness, Fatigue and Rapid HR   Nuclear Pre-Procedure Caffeine/Decaff Intake:  7:00pm NPO After: 9:00pm   Lungs:  clear IV 0.9% NS with Angio Cath:  22g  IV Site: R Hand  IV Started by:  Cathlyn Parsons, RN  Chest Size (in):  34 Cup Size: B  Height: 5\' 6"  (1.676 m)  Weight:  122 lb (55.339 kg)  BMI:  Body mass index is 19.69 kg/(m^2). Tech Comments:  Metoprolol held x 24hrs    Nuclear Med Study 1 or 2 day study: 1 day  Stress Test Type:  Stress  Reading MD: Olga Millers, MD  Order Authorizing Provider:  J.Allred  Resting Radionuclide: Technetium 23m Tetrofosmin  Resting Radionuclide Dose: 11.0 mCi   Stress Radionuclide:  Technetium 40m Tetrofosmin  Stress Radionuclide Dose: 3300 mCi           Stress Protocol Rest HR: 62 Stress HR: 141  Rest BP: 164/80 Stress BP: 183/69  Exercise Time (min): 7:00 METS: 8.5   Predicted Max HR: 157 bpm % Max HR: 89.81 bpm Rate Pressure Product: 46962   Dose of Adenosine (mg):  n/a Dose of Lexiscan: n/a mg  Dose of Atropine (mg): n/a Dose of Dobutamine: n/a mcg/kg/min (at max HR)  Stress Test Technologist: Milana Na, EMT-P  Nuclear Technologist:  Domenic Polite, CNMT     Rest Procedure:  Myocardial perfusion imaging was performed at rest 45 minutes following the intravenous administration of Technetium 36m Tetrofosmin. Rest ECG: NSR  Stress Procedure:  The patient exercised for 7:00.  The patient stopped due to fatigue and denied any chest pain.  There were no significant ST-T wave changes,  rare pacs, and a short run of PAT.  Technetium 7m Tetrofosmin was injected at peak exercise and myocardial perfusion imaging was performed after a brief delay. Stress ECG: No significant ST segment change suggestive of ischemia.  QPS Raw Data Images:  Acquisition technically good; normal left ventricular size. Stress Images:  There is decreased uptake in the inferoseptal wall. Rest Images:  There is decreased uptake in the inferoseptal wall. Subtraction (SDS):  No evidence of ischemia. Transient Ischemic Dilatation (Normal <1.22):  0.97 Lung/Heart Ratio (Normal <0.45):  0.27  Quantitative Gated Spect Images QGS EDV:  52 ml QGS ESV:  9 ml QGS cine images:  NL LV Function; NL Wall Motion QGS EF: 84%  Impression Exercise Capacity:  Fair exercise capacity. BP Response:  Normal blood pressure response. Clinical Symptoms:  No chest pain. ECG Impression:  No significant ST segment change suggestive of ischemia. Comparison with Prior Nuclear Study: No images to compare  Overall Impression:  Normal stress nuclear study with inferoseptal thinning but no ischemia.   Olga Millers

## 2011-05-17 NOTE — Progress Notes (Signed)
nuc med study routed to Dr. Johney Frame 03/17/11 Domenic Polite

## 2011-05-24 NOTE — Progress Notes (Signed)
Pt notified of results

## 2011-11-25 ENCOUNTER — Ambulatory Visit: Payer: Self-pay | Admitting: Unknown Physician Specialty

## 2011-11-26 ENCOUNTER — Other Ambulatory Visit: Payer: Self-pay | Admitting: Family Medicine

## 2011-11-29 ENCOUNTER — Other Ambulatory Visit: Payer: Self-pay | Admitting: Family Medicine

## 2011-11-29 DIAGNOSIS — I10 Essential (primary) hypertension: Secondary | ICD-10-CM

## 2011-11-29 DIAGNOSIS — I4891 Unspecified atrial fibrillation: Secondary | ICD-10-CM

## 2011-12-01 ENCOUNTER — Other Ambulatory Visit (INDEPENDENT_AMBULATORY_CARE_PROVIDER_SITE_OTHER): Payer: 59

## 2011-12-01 DIAGNOSIS — I4891 Unspecified atrial fibrillation: Secondary | ICD-10-CM

## 2011-12-01 DIAGNOSIS — I10 Essential (primary) hypertension: Secondary | ICD-10-CM

## 2011-12-01 LAB — LIPID PANEL
Cholesterol: 202 mg/dL — ABNORMAL HIGH (ref 0–200)
HDL: 91 mg/dL (ref >39.00)
Total CHOL/HDL Ratio: 2
Triglycerides: 88 mg/dL (ref 0.0–149.0)
VLDL: 17.6 mg/dL (ref 0.0–40.0)

## 2011-12-01 LAB — BASIC METABOLIC PANEL
BUN: 23 mg/dL (ref 6–23)
Calcium: 9.6 mg/dL (ref 8.4–10.5)
Creatinine, Ser: 0.9 mg/dL (ref 0.4–1.2)

## 2011-12-01 LAB — TSH: TSH: 0.88 u[IU]/mL (ref 0.35–5.50)

## 2011-12-01 LAB — LDL CHOLESTEROL, DIRECT: Direct LDL: 90.2 mg/dL

## 2011-12-06 ENCOUNTER — Encounter: Payer: Self-pay | Admitting: Family Medicine

## 2011-12-09 ENCOUNTER — Ambulatory Visit (INDEPENDENT_AMBULATORY_CARE_PROVIDER_SITE_OTHER): Payer: 59 | Admitting: Family Medicine

## 2011-12-09 ENCOUNTER — Encounter: Payer: Self-pay | Admitting: Family Medicine

## 2011-12-09 VITALS — BP 120/92 | HR 76 | Temp 98.0°F | Ht 66.0 in | Wt 126.1 lb

## 2011-12-09 DIAGNOSIS — I4891 Unspecified atrial fibrillation: Secondary | ICD-10-CM

## 2011-12-09 DIAGNOSIS — Z639 Problem related to primary support group, unspecified: Secondary | ICD-10-CM

## 2011-12-09 DIAGNOSIS — Z0000 Encounter for general adult medical examination without abnormal findings: Secondary | ICD-10-CM | POA: Insufficient documentation

## 2011-12-09 MED ORDER — ESOMEPRAZOLE MAGNESIUM 40 MG PO CPDR: 40.0000 mg | DELAYED_RELEASE_CAPSULE | Freq: Every day | ORAL | Status: DC

## 2011-12-09 NOTE — Patient Instructions (Addendum)
Look into healthy ways to relieve stress (exercise, reading a book, spending quality time with your family, etc).  If things getting worse, let me know. Call your insurace about the shingles shot to see if it is covered or how much it would cost and where is cheaper (here or pharmacy).  If you want to receive here, call for nurse visit. Good to meet you today, call us with quesitons. Return in 1 year or as needed

## 2011-12-09 NOTE — Progress Notes (Signed)
Subjective:    Patient ID: Alexis Bentley, female    DOB: 24-Dec-1945, 66 y.o.   MRN: [id number]  HPI CC: CPE  Here for CPE today.  Takes care of father, feels nerves on edge.  Getting more anxious/stressed.  No good strategies to relieve stress currently.  [place] lives [place].  Sometimes feels overwhelmed.  H/o parox afib 2010, no recurrence. H/o NSTEMI - pt denies this - will review records  ==> NSTEMI with elevated troponins in setting of HR 170s and anticipated demand ischemia, cath 2010 WNL.  Off statin since.  Preventative: Colonoscopy 2008.  Good for 10 yrs. Well woman exam - OBGYN Dr. Haskel [facility]), mammogram 11/2011. Tetanus 2009.  Flu 2012. Shingles shot - has not had.  May be interested.  Will look into insurance.  Married Lives with husband.  2 grown children Daily caffeine use Retired 2013 was [employer] Activity: walking Diet: good water intake, fruits/vegetables daily  Medications and allergies reviewed and updated in chart.  Past histories reviewed and updated if relevant as below. Patient Active Problem List  Diagnoses  . HYPERTENSION  . ATRIAL FIBRILLATION  . HEMORRHOIDS  . GERD  . PEPTIC ULCER DISEASE  . EROSIVE GASTRITIS  . HIATAL HERNIA  . RECTAL PROLAPSE  . BACK PAIN, LUMBAR, WITH RADICULOPATHY  . CHEST PAIN   Past Medical History  Diagnosis Date  . Paroxysmal atrial fibrillation 2010    one episode  . Hypertension   . Chest pain   . Lumbar back pain     With radiculopathy  . Hemorrhoids   . Hiatal hernia   . Erosive gastritis   . Rectal prolapse   . Peptic ulcer disease   . GERD (gastroesophageal reflux disease)   . Hypokalemia     Repleted  . Non-ST elevation myocardial infarction (NSTEMI)     likely type 2   Past Surgical History  Procedure Date  . Partial hysterectomy 1981    Bleeding, cyst, bladder tack  . Rectal prolapse repair 2005  . Dexa osteopenia     Hip  . Colonoscopy 12/03/2006   Internal/ external hemorrhoids, no polyps  . Esophagogastroduodenoscopy 04/27/2007    Erosive and ulcerative gastritis; small H.H.  . Coronary angioplasty 08/07/2009    NML  . Total abdominal hysterectomy     With a bilateral salpingo- oophorectomy   History  Substance Use Topics  . Smoking status: Never Smoker   . Smokeless tobacco: Never Used  . Alcohol Use: No   Family History  Problem Relation Age of Onset  . Hypertension Mother   . COPD Mother     Prior smoker  . Coronary artery disease Mother   . Other Mother     HBP  . Heart attack Father     MI, x4 stents, Pacer  . Hypertension Father   . Stroke Father     Ministrokes  . Coronary artery disease Father   . Other Father     HBP  . Colon cancer Father   . Testicular cancer Brother   . Prostate cancer Brother   . Diabetes type II Son    No Known Allergies Current Outpatient Prescriptions on File Prior to Visit  Medication Sig Dispense Refill  . aspirin 325 MG EC tablet Take 325 mg by mouth daily.        . Calcium Carbonate-Vitamin D (CALTRATE 600+D) 600-400 MG-UNIT per tablet Take 2 tablets by mouth daily.        Marland Kitchen  lisinopril (PRINIVIL,ZESTRIL) 20 MG tablet Take 1 tablet (20 mg total) by mouth daily.  30 tablet  11  . metoprolol (LOPRESSOR) 50 MG tablet Take 1 tablet (50 mg total) by mouth 2 (two) times daily.  60 tablet  11  . NEXIUM 40 MG capsule TAKE 1 CAPSULE BY MOUTH EVERY MORNING  30 capsule  1    Review of Systems  Constitutional: Negative for fever, chills, activity change, appetite change, fatigue and unexpected weight change.  HENT: Negative for hearing loss and neck pain.   Eyes: Negative for visual disturbance.  Respiratory: Negative for cough, chest tightness, shortness of breath and wheezing.   Cardiovascular: Negative for chest pain, palpitations and leg swelling.  Gastrointestinal: Negative for nausea, vomiting, abdominal pain, diarrhea, constipation, blood in stool and abdominal distention.    Genitourinary: Negative for hematuria and difficulty urinating.  Musculoskeletal: Negative for myalgias and arthralgias.  Skin: Negative for rash.  Neurological: Negative for dizziness, seizures, syncope and headaches.  Hematological: Does not bruise/bleed easily.  Psychiatric/Behavioral: Negative for dysphoric mood. The patient is not nervous/anxious.        Objective:   Physical Exam  Nursing note and vitals reviewed. Constitutional: She is oriented to person, place, and time. She appears well-developed and well-nourished. No distress.  HENT:  Head: Normocephalic and atraumatic.  Right Ear: Hearing, tympanic membrane, external ear and ear canal normal.  Left Ear: Hearing, tympanic membrane, external ear and ear canal normal.  Nose: Nose normal.  Mouth/Throat: Oropharynx is clear and moist. No oropharyngeal exudate.  Eyes: Conjunctivae and EOM are normal. Pupils are equal, round, and reactive to light. No scleral icterus.  Neck: Normal range of motion. Neck supple. Carotid bruit is not present. No thyromegaly present.  Cardiovascular: Normal rate, regular rhythm, normal heart sounds and intact distal pulses.   No murmur heard. Pulses:      Radial pulses are 2+ on the right side, and 2+ on the left side.  Pulmonary/Chest: Effort normal and breath sounds normal. No respiratory distress. She has no wheezes. She has no rales.  Abdominal: Soft. Bowel sounds are normal. She exhibits no distension and no mass. There is no tenderness. There is no rebound and no guarding.  Musculoskeletal: Normal range of motion. She exhibits no edema.  Lymphadenopathy:    She has no cervical adenopathy.  Neurological: She is alert and oriented to person, place, and time.       CN grossly intact, station and gait intact  Skin: Skin is warm and dry. No rash noted.  Psychiatric: She has a normal mood and affect. Her behavior is normal. Judgment and thought content normal.      Assessment & Plan:

## 2011-12-09 NOTE — Assessment & Plan Note (Signed)
Preventative protocols reviewed and updated unless pt declined. Discussed zostavax - pt will check with insurance. Discussed healthy living and healthy diet choices. rtc 1 year or as needed.

## 2011-12-10 DIAGNOSIS — Z639 Problem related to primary support group, unspecified: Secondary | ICD-10-CM | POA: Insufficient documentation

## 2011-12-10 NOTE — Assessment & Plan Note (Signed)
Stable, NSR today. One episode of this.   Continue to monitor. Tolerating meds well. Full dose aspirin.

## 2011-12-10 NOTE — Assessment & Plan Note (Signed)
Discussed stress of caring for father but does have good support in area. Discussed caregiver burnout and importance of protected private time or time with her own family. Will try healthy coping mechanisms to deal with stress (ie walking, exercise, reading).  If not helping, to update me and consider other therapy.

## 2012-01-06 ENCOUNTER — Encounter: Payer: Self-pay | Admitting: Family Medicine

## 2012-01-06 ENCOUNTER — Ambulatory Visit (INDEPENDENT_AMBULATORY_CARE_PROVIDER_SITE_OTHER): Payer: 59 | Admitting: Family Medicine

## 2012-01-06 VITALS — BP 122/70 | HR 59 | Temp 97.9°F | Wt 127.0 lb

## 2012-01-06 DIAGNOSIS — L255 Unspecified contact dermatitis due to plants, except food: Secondary | ICD-10-CM

## 2012-01-06 DIAGNOSIS — L237 Allergic contact dermatitis due to plants, except food: Secondary | ICD-10-CM

## 2012-01-06 MED ORDER — PREDNISONE 5 MG PO KIT: PACK | ORAL | Status: DC

## 2012-01-06 NOTE — Patient Instructions (Addendum)
Start the steroid taper today.  Take it with food.  Restart it if you need to but not if you are doing well.  Take claritin 10mg  a day.

## 2012-01-06 NOTE — Progress Notes (Signed)
Likely poison oak. On face bilaterally, scalp, arms.  She has a history of sig sx with exposures.  Had been working in the yard.  This feels like prev.  Had to take pred prev.  Itching.  No vision change.  No FCNAVD.    Meds, vitals, and allergies reviewed.   ROS: See HPI.  Otherwise, noncontributory.  nad ncat but nondermatomal maculopapular red rash on face B and L arm.

## 2012-01-08 DIAGNOSIS — L237 Allergic contact dermatitis due to plants, except food: Secondary | ICD-10-CM | POA: Insufficient documentation

## 2012-01-08 NOTE — Assessment & Plan Note (Signed)
Typical presentation, start oral steroids with GI caution and f/u prn.  She agrees.  Nontoxic.

## 2012-04-11 ENCOUNTER — Other Ambulatory Visit: Payer: Self-pay

## 2012-04-11 DIAGNOSIS — I4891 Unspecified atrial fibrillation: Secondary | ICD-10-CM

## 2012-04-11 DIAGNOSIS — R079 Chest pain, unspecified: Secondary | ICD-10-CM

## 2012-04-11 DIAGNOSIS — I10 Essential (primary) hypertension: Secondary | ICD-10-CM

## 2012-04-11 MED ORDER — LISINOPRIL 20 MG PO TABS: 20.0000 mg | ORAL_TABLET | Freq: Every day | ORAL | Status: DC

## 2012-04-11 MED ORDER — METOPROLOL TARTRATE 50 MG PO TABS: 50.0000 mg | ORAL_TABLET | Freq: Two times a day (BID) | ORAL | Status: DC

## 2012-04-11 NOTE — Telephone Encounter (Signed)
..   Requested Prescriptions   Signed Prescriptions Disp Refills  . lisinopril (PRINIVIL,ZESTRIL) 20 MG tablet 30 tablet 6    Sig: Take 1 tablet (20 mg total) by mouth daily.    Authorizing Provider: Hillis Range    Ordering User: Christella Hartigan, Dauntae Derusha Judie Petit

## 2012-04-11 NOTE — Telephone Encounter (Signed)
..   Requested Prescriptions   Signed Prescriptions Disp Refills  . metoprolol (LOPRESSOR) 50 MG tablet 60 tablet 3    Sig: Take 1 tablet (50 mg total) by mouth 2 (two) times daily.    Authorizing Provider: Hillis Range    Ordering User: Christella Hartigan, Alani Sabbagh Judie Petit

## 2012-08-14 ENCOUNTER — Other Ambulatory Visit: Payer: Self-pay | Admitting: *Deleted

## 2012-08-14 DIAGNOSIS — I10 Essential (primary) hypertension: Secondary | ICD-10-CM

## 2012-08-14 DIAGNOSIS — R079 Chest pain, unspecified: Secondary | ICD-10-CM

## 2012-08-14 DIAGNOSIS — I4891 Unspecified atrial fibrillation: Secondary | ICD-10-CM

## 2012-08-14 MED ORDER — METOPROLOL TARTRATE 50 MG PO TABS: 50.0000 mg | ORAL_TABLET | Freq: Two times a day (BID) | ORAL | Status: DC

## 2012-09-12 ENCOUNTER — Ambulatory Visit: Payer: 59 | Admitting: Internal Medicine

## 2012-09-17 ENCOUNTER — Ambulatory Visit (INDEPENDENT_AMBULATORY_CARE_PROVIDER_SITE_OTHER): Payer: 59 | Admitting: Internal Medicine

## 2012-09-17 VITALS — BP 110/60 | HR 58 | Ht 66.0 in | Wt 127.0 lb

## 2012-09-17 DIAGNOSIS — I4891 Unspecified atrial fibrillation: Secondary | ICD-10-CM

## 2012-09-17 DIAGNOSIS — R079 Chest pain, unspecified: Secondary | ICD-10-CM

## 2012-09-17 DIAGNOSIS — I10 Essential (primary) hypertension: Secondary | ICD-10-CM

## 2012-09-17 MED ORDER — APIXABAN 5 MG PO TABS: 5.0000 mg | ORAL_TABLET | Freq: Two times a day (BID) | ORAL | Status: DC

## 2012-09-17 MED ORDER — LISINOPRIL 20 MG PO TABS: 20.0000 mg | ORAL_TABLET | Freq: Every day | ORAL | Status: DC

## 2012-09-17 MED ORDER — METOPROLOL TARTRATE 50 MG PO TABS: 50.0000 mg | ORAL_TABLET | Freq: Two times a day (BID) | ORAL | Status: DC

## 2012-09-17 NOTE — Assessment & Plan Note (Signed)
Stable No change required today  

## 2012-09-17 NOTE — Patient Instructions (Signed)
Your physician wants you to follow-up in: 12 months with Dr Jacquiline Doe will receive a reminder letter in the mail two months in advance. If you don't receive a letter, please call our office to schedule the follow-up appointment.   Your physician has recommended you make the following change in your medication:  1) stop ASA 2) Start Eliquis 5mg  twice daily

## 2012-09-17 NOTE — Progress Notes (Signed)
PCP: Eustaquio Boyden, MD  Alexis Bentley is a 66 y.o. female who presents today for routine electrophysiology followup.  Since last being seen in our clinic, the patient reports doing very well.  She has occasional nocturnal palpitations but finds that these are short lived (only several minutes at most).  Today, she denies symptoms of  chest pain, shortness of breath,  lower extremity edema, dizziness, presyncope, or syncope.  The patient is otherwise without complaint today.   Past Medical History  Diagnosis Date  . Paroxysmal atrial fibrillation 2010    one episode  . Hypertension   . Chest pain   . Lumbar back pain     With radiculopathy  . Hemorrhoids   . Hiatal hernia   . Erosive gastritis   . Rectal prolapse   . Peptic ulcer disease   . GERD (gastroesophageal reflux disease)   . Non-ST elevation myocardial infarction (NSTEMI)     likely type 2 - demand ischemia during afib   Past Surgical History  Procedure Date  . Partial hysterectomy 1981    Bleeding, cyst, bladder tack  . Rectal prolapse repair 2005  . Dexa osteopenia     Hip  . Colonoscopy 12/03/2006    Internal/ external hemorrhoids, no polyps  . Esophagogastroduodenoscopy 04/27/2007    Erosive and ulcerative gastritis; small H.H.  . Coronary angioplasty 08/07/2009    NML  . Total abdominal hysterectomy     With a bilateral salpingo- oophorectomy    Current Outpatient Prescriptions  Medication Sig Dispense Refill  . aspirin 325 MG EC tablet Take 325 mg by mouth daily.        . Calcium Carbonate-Vitamin D (CALTRATE 600+D) 600-400 MG-UNIT per tablet Take 2 tablets by mouth daily.        Marland Kitchen esomeprazole (NEXIUM) 40 MG capsule Take 1 capsule (40 mg total) by mouth daily before breakfast.  30 capsule  11  . lisinopril (PRINIVIL,ZESTRIL) 20 MG tablet Take 1 tablet (20 mg total) by mouth daily.  30 tablet  6  . metoprolol (LOPRESSOR) 50 MG tablet Take 1 tablet (50 mg total) by mouth 2 (two) times daily.  60 tablet  0     Physical Exam: Filed Vitals:   09/17/12 1428  BP: 110/60  Pulse: 58  Height: 5\' 6"  (1.676 m)  Weight: 127 lb (57.607 kg)    GEN- The patient is well appearing, alert and oriented x 3 today.   Head- normocephalic, atraumatic Eyes-  Sclera clear, conjunctiva pink Ears- hearing intact Oropharynx- clear Lungs- Clear to ausculation bilaterally, normal work of breathing Heart- Regular rate and rhythm, no murmurs, rubs or gallops, PMI not laterally displaced GI- soft, NT, ND, + BS Extremities- no clubbing, cyanosis, or edema  ekg today reveals sinus rhythm 58 bpm, otherwise normal ekg  Assessment and Plan:

## 2012-09-17 NOTE — Assessment & Plan Note (Signed)
Doing very well without prolonged episodes of afib. Her CHADs2VASC score is 2 (female with htn).  Guidelines would therefore recommend anticoagulation. Pros and cons of coumadin, xarelto, pradaxa, and eliquis were discussed at length today.  She would prefer eliquis.  Stop ASA and start eliquis 5mg  BID.  She will be offered enrollment in [place] AF anticoagulation registry  Return in 1 year

## 2012-11-08 DIAGNOSIS — T859XXA Unspecified complication of internal prosthetic device, implant and graft, initial encounter: Secondary | ICD-10-CM | POA: Insufficient documentation

## 2012-11-13 ENCOUNTER — Telehealth: Payer: Self-pay | Admitting: Internal Medicine

## 2012-11-13 NOTE — Telephone Encounter (Signed)
New problem    Upcoming  Surgery on 2/25. Please advise when to stop eliquis.

## 2012-11-13 NOTE — Telephone Encounter (Signed)
Spoke with Michiel Sites, NP at [facility]. 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  Need to know when to stop Eliquis prior to surgery.

## 2012-11-15 NOTE — Telephone Encounter (Signed)
Hold eliquis for 48 hours prior to surgery and restart 24 hours afterwards.

## 2012-11-19 NOTE — Telephone Encounter (Signed)
Called and spoke with PA she is aware of Dr Jenel Lucks recommendations

## 2012-11-26 DIAGNOSIS — I4891 Unspecified atrial fibrillation: Secondary | ICD-10-CM | POA: Insufficient documentation

## 2012-12-31 ENCOUNTER — Other Ambulatory Visit: Payer: Self-pay | Admitting: Family Medicine

## 2013-01-08 ENCOUNTER — Ambulatory Visit: Payer: Self-pay | Admitting: Unknown Physician Specialty

## 2013-04-30 ENCOUNTER — Telehealth: Payer: Self-pay | Admitting: Internal Medicine

## 2013-04-30 NOTE — Telephone Encounter (Signed)
Walk In pt Form " [employer]/Eliquis" papers Dropped Off For Kelly/Allred Review Gave to [facility] 04/30/13/KM

## 2013-05-20 ENCOUNTER — Ambulatory Visit (INDEPENDENT_AMBULATORY_CARE_PROVIDER_SITE_OTHER): Payer: Medicare Other | Admitting: Family Medicine

## 2013-05-20 ENCOUNTER — Encounter: Payer: Self-pay | Admitting: Family Medicine

## 2013-05-20 VITALS — BP 148/92 | HR 72 | Temp 98.6°F | Wt 132.0 lb

## 2013-05-20 DIAGNOSIS — R609 Edema, unspecified: Secondary | ICD-10-CM

## 2013-05-20 DIAGNOSIS — K219 Gastro-esophageal reflux disease without esophagitis: Secondary | ICD-10-CM | POA: Diagnosis not present

## 2013-05-20 DIAGNOSIS — K2960 Other gastritis without bleeding: Secondary | ICD-10-CM

## 2013-05-20 DIAGNOSIS — K279 Peptic ulcer, site unspecified, unspecified as acute or chronic, without hemorrhage or perforation: Secondary | ICD-10-CM | POA: Diagnosis not present

## 2013-05-20 DIAGNOSIS — I10 Essential (primary) hypertension: Secondary | ICD-10-CM

## 2013-05-20 DIAGNOSIS — R600 Localized edema: Secondary | ICD-10-CM

## 2013-05-20 MED ORDER — OMEPRAZOLE 40 MG PO CPDR: 40.0000 mg | DELAYED_RELEASE_CAPSULE | Freq: Every day | ORAL | Status: DC

## 2013-05-20 MED ORDER — HYDROCHLOROTHIAZIDE 12.5 MG PO CAPS: 12.5000 mg | ORAL_CAPSULE | Freq: Every day | ORAL | Status: DC

## 2013-05-20 NOTE — Patient Instructions (Signed)
Trial of omeprazole 40mg  daily instead of nexium.   Start hydrochlorothiazide 12.5mg  daily. Blood work and urine checked today  Elevate legs when at home resting.  Decrease sodium (salt), increase potassium, and increase water. Let us know if this is not helping.

## 2013-05-20 NOTE — Progress Notes (Signed)
  Subjective:    Patient ID: Alexis Bentley, female    DOB: [birth year], 67 y.o.   MRN: [id number]  HPI CC: ankle swelling  Pleasant 67 yo with h/o   GERD - on nexium 40mg  daily.  When cutting down on nexium, notices that indigestion/heartburn flares up, water brash.  Has not tried anything else in the past.   H/o PUD several years ago.  For last 2 months, noticing increasing bilateral ankle swelling intermittently.  Present even in morning at times.  Nothing improves so far.  Hasn't tried elevation of legs yet.  Hasn't noticed diet relation.  Hasn't changed diet Denies chest pain, dyspnea, coughing.    On eliquis since 09/2012  H/o HTN - recently running elevated.  However, marked lability.  100-160 systolic.  Wt Readings from Last 3 Encounters:  05/20/13 132 lb (59.875 kg)  09/17/12 127 lb (57.607 kg)  01/06/12 127 lb (57.607 kg)    Past Medical History  Diagnosis Date  . Paroxysmal atrial fibrillation 2010    one episode  . Hypertension   . Chest pain   . Lumbar back pain     With radiculopathy  . Hemorrhoids   . Hiatal hernia   . Erosive gastritis   . Rectal prolapse   . Peptic ulcer disease   . GERD (gastroesophageal reflux disease)   . Non-ST elevation myocardial infarction (NSTEMI)     likely type 2 - demand ischemia during afib   Past Surgical History  Procedure Laterality Date  . Partial hysterectomy  1981    Bleeding, cyst, bladder tack  . Rectal prolapse repair  2005  . Dexa osteopenia      Hip  . Colonoscopy  12/03/2006    Internal/ external hemorrhoids, no polyps  . Esophagogastroduodenoscopy  04/27/2007    Erosive and ulcerative gastritis; small H.H.  . Coronary angioplasty  08/07/2009    NML  . Total abdominal hysterectomy      With a bilateral salpingo- oophorectomy    Review of Systems Per HPI    Objective:   Physical Exam  Nursing note and vitals reviewed. Constitutional: She appears well-developed and well-nourished. No distress.   HENT:  Mouth/Throat: Oropharynx is clear and moist. No oropharyngeal exudate.  Neck: JVD (mild) present.  Cardiovascular: Normal rate, regular rhythm, normal heart sounds and intact distal pulses.   No murmur heard. Pulmonary/Chest: Effort normal and breath sounds normal. No respiratory distress. She has no wheezes. She has no rales.  Musculoskeletal: She exhibits no edema (nonpitting edema bilaterally).  Skin: Skin is warm and dry. No rash noted.       Assessment & Plan:

## 2013-05-20 NOTE — Assessment & Plan Note (Signed)
Nonpitting edema present today, along with elevated blood pressure.  ?CVI vs other cause.   Check Cr as well as microalbumin today, along with TSH. Given elevated blood pressure, start HCTZ 12.5mg  daily. Recommended elevation of legs, decreased sodium intake, and increased water intake.

## 2013-05-20 NOTE — Assessment & Plan Note (Signed)
Has not been on other PPI.  In h/o PUD, erosive gastritis, and precipitous return of sxs with discontinuation of PPI, recommended continued regular use of PPI. Will prescribe omeprazole as cheaper alternative to nexium.

## 2013-05-20 NOTE — Assessment & Plan Note (Signed)
Above goal, in setting of recent peripheral edema.  Add on hydrochlorothiazide 12.5mg  (continue lisinopril and metoprolol). Check blood work today. Pt agrees with plan.

## 2013-05-21 ENCOUNTER — Ambulatory Visit: Payer: 59 | Admitting: Family Medicine

## 2013-05-21 LAB — BASIC METABOLIC PANEL
BUN: 22 mg/dL (ref 6–23)
CO2: 25 mEq/L (ref 19–32)
Calcium: 9.3 mg/dL (ref 8.4–10.5)
Chloride: 106 mEq/L (ref 96–112)
Creatinine, Ser: 0.9 mg/dL (ref 0.4–1.2)
Glucose, Bld: 76 mg/dL (ref 70–99)

## 2013-05-21 LAB — MICROALBUMIN / CREATININE URINE RATIO: Creatinine,U: 87.9 mg/dL

## 2013-05-21 LAB — TSH: TSH: 1.56 u[IU]/mL (ref 0.35–5.50)

## 2013-05-22 ENCOUNTER — Encounter: Payer: Self-pay | Admitting: *Deleted

## 2013-05-27 ENCOUNTER — Other Ambulatory Visit: Payer: Self-pay

## 2013-05-27 ENCOUNTER — Telehealth: Payer: Self-pay | Admitting: Internal Medicine

## 2013-05-27 MED ORDER — APIXABAN 5 MG PO TABS: 5.0000 mg | ORAL_TABLET | Freq: Two times a day (BID) | ORAL | Status: DC

## 2013-05-27 NOTE — Telephone Encounter (Signed)
This medication has been approved for 12 months and patient is aware

## 2013-05-27 NOTE — Telephone Encounter (Signed)
New problem   Pt is out of Eliquis and stated ins need prior auth,from doctor. Pt stated this hasn't been done she doesn't know what to do. Please call pt.

## 2013-07-20 DIAGNOSIS — Z23 Encounter for immunization: Secondary | ICD-10-CM | POA: Diagnosis not present

## 2013-08-08 ENCOUNTER — Ambulatory Visit (INDEPENDENT_AMBULATORY_CARE_PROVIDER_SITE_OTHER): Payer: Medicare Other | Admitting: Internal Medicine

## 2013-08-08 ENCOUNTER — Encounter: Payer: Self-pay | Admitting: Internal Medicine

## 2013-08-08 VITALS — BP 116/64 | HR 64 | Temp 97.9°F | Wt 130.0 lb

## 2013-08-08 DIAGNOSIS — I4891 Unspecified atrial fibrillation: Secondary | ICD-10-CM

## 2013-08-08 DIAGNOSIS — R079 Chest pain, unspecified: Secondary | ICD-10-CM | POA: Diagnosis not present

## 2013-08-08 DIAGNOSIS — I10 Essential (primary) hypertension: Secondary | ICD-10-CM

## 2013-08-08 DIAGNOSIS — R55 Syncope and collapse: Secondary | ICD-10-CM | POA: Diagnosis not present

## 2013-08-08 MED ORDER — METOPROLOL TARTRATE 50 MG PO TABS: 25.0000 mg | ORAL_TABLET | Freq: Two times a day (BID) | ORAL | Status: DC

## 2013-08-08 NOTE — Patient Instructions (Signed)

## 2013-08-08 NOTE — Progress Notes (Signed)
Subjective:    Patient ID: Alexis Bentley, female    DOB: 1945-11-15, 67 y.o.   MRN: [id number]  HPI  Pt presents to the clinic today with c/o dizziness. This started yesterday. She reports that she got real hot and sweaty, and next thing she knows she woke up in the floor. She is unsure if she lost consciousness. She denies chest pain, chest tightness or shortness of breath with this episode. She does have a history of paroxysmal afib. She is being followed by Dr. Johney Frame. Of note, she is under a lot of stress. Her sister is very ill in the hospital. She has been running back and forth visiting her daily. She has not been eating or drinking like she should because of her stress level. Her last ECG was done 09/2012 by Dr. Harvin Hazel.  Review of Systems      Past Medical History  Diagnosis Date  . Paroxysmal atrial fibrillation 2010    one episode  . Hypertension   . Chest pain   . Lumbar back pain     With radiculopathy  . Hemorrhoids   . Hiatal hernia   . Erosive gastritis   . Rectal prolapse   . Peptic ulcer disease   . GERD (gastroesophageal reflux disease)   . Non-ST elevation myocardial infarction (NSTEMI)     likely type 2 - demand ischemia during afib    Current Outpatient Prescriptions  Medication Sig Dispense Refill  . apixaban (ELIQUIS) 5 MG TABS tablet Take 1 tablet (5 mg total) by mouth 2 (two) times daily.  60 tablet  6  . Calcium Carbonate-Vitamin D (CALTRATE 600+D) 600-400 MG-UNIT per tablet Take 2 tablets by mouth daily.        . hydrochlorothiazide (MICROZIDE) 12.5 MG capsule Take 1 capsule (12.5 mg total) by mouth daily.  30 capsule  6  . lisinopril (PRINIVIL,ZESTRIL) 20 MG tablet Take 1 tablet (20 mg total) by mouth daily.  30 tablet  11  . metoprolol (LOPRESSOR) 50 MG tablet Take 1 tablet (50 mg total) by mouth 2 (two) times daily.  60 tablet  11  . NEXIUM 40 MG capsule TAKE 1 CAPSULE (40 MG TOTAL) BY MOUTH DAILY BEFORE BREAKFAST.  30 capsule  6  .  omeprazole (PRILOSEC) 40 MG capsule Take 1 capsule (40 mg total) by mouth daily.  30 capsule  6   No current facility-administered medications for this visit.    No Known Allergies  Family History  Problem Relation Age of Onset  . Hypertension Mother   . COPD Mother     Prior smoker  . Coronary artery disease Mother   . Other Mother     HBP  . Heart attack Father     MI, x4 stents, Pacer  . Hypertension Father   . Stroke Father     Ministrokes  . Coronary artery disease Father   . Other Father     HBP  . Colon cancer Father   . Testicular cancer Brother   . Prostate cancer Brother   . Diabetes type II Son     History   Social History  . Marital Status: Married    Spouse Name: N/A    Number of Children: N/A  . Years of Education: N/A   Occupational History  .  [employer]   Social History Main Topics  . Smoking status: Never Smoker   . Smokeless tobacco: Never Used  . Alcohol Use: No  . Drug Use:  No  . Sexual Activity: Not on file   Other Topics Concern  . Not on file   Social History Narrative   Married   Lives with husband.  2 grown children   Daily caffeine use   Retired 2013 was [employer]   Activity: walking   Diet: good water intake, fruits/vegetables daily     Constitutional: Denies fever, malaise, fatigue, headache or abrupt weight changes.  Respiratory: Denies difficulty breathing, shortness of breath, cough or sputum production.   Cardiovascular: Denies chest pain, chest tightness, palpitations or swelling in the hands or feet.  Neurological: Denies difficulty with memory, difficulty with speech or problems with balance and coordination.   No other specific complaints in a complete review of systems (except as listed in HPI above).  Objective:   Physical Exam  BP 116/64  Pulse 64  Temp(Src) 97.9 F (36.6 C) (Oral)  Wt 130 lb (58.968 kg) Wt Readings from Last 3 Encounters:  08/08/13 130 lb (58.968 kg)  05/20/13  132 lb (59.875 kg)  09/17/12 127 lb (57.607 kg)    General: Appears her stated age, well developed, well nourished in NAD. Cardiovascular: Normal rate and rhythm. S1,S2 noted.  No murmur, rubs or gallops noted. No JVD or BLE edema. No carotid bruits noted. Pulmonary/Chest: Normal effort and positive vesicular breath sounds. No respiratory distress. No wheezes, rales or ronchi noted.  Neurological: Alert and oriented. Cranial nerves II-XII intact. Coordination normal. +DTRs bilaterally.  BMET    Component Value Date/Time   NA 139 05/20/2013 1717   K 4.5 05/20/2013 1717   CL 106 05/20/2013 1717   CO2 25 05/20/2013 1717   GLUCOSE 76 05/20/2013 1717   BUN 22 05/20/2013 1717   CREATININE 0.9 05/20/2013 1717   CALCIUM 9.3 05/20/2013 1717   GFRNONAA 90.29 09/29/2009 0857   GFRAA  Value: >60        The eGFR has been calculated using the MDRD equation. This calculation has not been validated in all clinical situations. eGFR's persistently <60 mL/min signify possible Chronic Kidney Disease. 08/08/2009 0208    Lipid Panel     Component Value Date/Time   CHOL 202* 12/01/2011 0850   TRIG 88.0 12/01/2011 0850   HDL 91.00 12/01/2011 0850   CHOLHDL 2 12/01/2011 0850   VLDL 17.6 12/01/2011 0850   LDLCALC  Value: 86        Total Cholesterol/HDL:CHD Risk Coronary Heart Disease Risk Table                     Men   Women  1/2 Average Risk   3.4   3.3  Average Risk       5.0   4.4  2 X Average Risk   9.6   7.1  3 X Average Risk  23.4   11.0        Use the calculated Patient Ratio above and the CHD Risk Table to determine the patient's CHD Risk.        ATP III CLASSIFICATION (LDL):  <100     mg/dL   Optimal  161-096  mg/dL   Near or Above                    Optimal  130-159  mg/dL   Borderline  045-409  mg/dL   High  >811     mg/dL   Very High 91/01/7828 5621    CBC    Component Value Date/Time  WBC 5.1 08/08/2009 0208   RBC 3.71* 08/08/2009 0208   HGB 12.0 08/08/2009 0208   HCT 34.2* 08/08/2009 0208   PLT 199  08/08/2009 0208   MCV 92.3 08/08/2009 0208   MCHC 35.1 08/08/2009 0208   RDW 13.5 08/08/2009 0208   LYMPHSABS 1.4 08/07/2009 0650   MONOABS 0.3 08/07/2009 0650   EOSABS 0.1 08/07/2009 0650   BASOSABS 0.0 08/07/2009 0650    Hgb A1C No results found for this basename: HGBA1C          Assessment & Plan:   Dizziness s/p vagal episode likely d/t stress, dehydration:  ECG today-SB rate 56- no changes from prior Lets cut your Metoprolol down to 25 mg BID Keep your f/u appt with Dr. Johney Frame for 09/08/13  RTC as needed or if you experience recurrent dizziness, lightheadedness or another syncopal episode

## 2013-08-08 NOTE — Progress Notes (Signed)
Pre-visit discussion using our clinic review tool. No additional management support is needed unless otherwise documented below in the visit note.  

## 2013-09-18 ENCOUNTER — Encounter: Payer: Self-pay | Admitting: Internal Medicine

## 2013-09-18 ENCOUNTER — Ambulatory Visit (INDEPENDENT_AMBULATORY_CARE_PROVIDER_SITE_OTHER): Payer: Medicare Other | Admitting: Internal Medicine

## 2013-09-18 VITALS — BP 127/72 | HR 55 | Ht 66.0 in | Wt 131.0 lb

## 2013-09-18 DIAGNOSIS — R55 Syncope and collapse: Secondary | ICD-10-CM

## 2013-09-18 DIAGNOSIS — I10 Essential (primary) hypertension: Secondary | ICD-10-CM

## 2013-09-18 DIAGNOSIS — I4891 Unspecified atrial fibrillation: Secondary | ICD-10-CM | POA: Diagnosis not present

## 2013-09-18 DIAGNOSIS — R079 Chest pain, unspecified: Secondary | ICD-10-CM

## 2013-09-18 MED ORDER — APIXABAN 5 MG PO TABS: 5.0000 mg | ORAL_TABLET | Freq: Two times a day (BID) | ORAL | Status: DC

## 2013-09-18 MED ORDER — HYDROCHLOROTHIAZIDE 12.5 MG PO CAPS: ORAL_CAPSULE | ORAL | Status: DC

## 2013-09-18 MED ORDER — METOPROLOL TARTRATE 50 MG PO TABS: 25.0000 mg | ORAL_TABLET | Freq: Two times a day (BID) | ORAL | Status: DC

## 2013-09-18 MED ORDER — LISINOPRIL 20 MG PO TABS: 20.0000 mg | ORAL_TABLET | Freq: Every day | ORAL | Status: DC

## 2013-09-18 NOTE — Patient Instructions (Addendum)
Your physician wants you to follow-up in: 12 months with Dr Alexis Bentley will receive a reminder letter in the mail two months in advance. If you don't receive a letter, please call our office to schedule the follow-up appointment.  Your physician has requested that you have an echocardiogram. Echocardiography is a painless test that uses sound waves to create images of your heart. It provides your doctor with information about the size and shape of your heart and how well your heart's chambers and valves are working. This procedure takes approximately one hour. There are no restrictions for this procedure.    3 to 4 Gram Sodium Diet, No Added Salt (NAS) A 3 to 4 gram sodium diet restricts the amount of sodium in the diet to no more than 3 to 4 g or 3000 to 4000 mg daily. Limiting the amount of sodium is often used to help lower blood pressure. It is important if you have heart, liver, or kidney problems. Many foods contain sodium for flavor and sometimes as a preservative. When the amount of sodium in a diet needs to be low, it is important to know what to look for when choosing foods and drinks. The following includes some information and guidelines to help make it easier for you to adapt to a low sodium diet. QUICK TIPS  Do not add salt to food.  Avoid convenience items and fast food.  Choose unsalted snack foods.  Buy lower sodium products, often labeled as "lower sodium" or "no salt added."  Check food labels to learn how much sodium is in 1 serving.  When eating at a restaurant, ask that your food be prepared with less salt or none, if possible. READING FOOD LABELS FOR SODIUM INFORMATION The nutrition facts label is a good place to find how much sodium is in foods. Look for products with no more than 500 to 600 mg of sodium per meal and no more than 150 mg per serving. Remember that 3 to 4 g = 3000 to 4000 mg. The food label may also list foods as:  Sodium-free: Less than 5 mg in a  serving.  Very low sodium: 35 mg or less in a serving.  Low-sodium: 140 mg or less in a serving.  Light in sodium: 50% less sodium in a serving. For example, if a food that usually has 300 mg of sodium is changed to become light in sodium, it will have 150 mg of sodium.  Reduced sodium: 25% less sodium in a serving. For example, if a food that usually has 400 mg of sodium is changed to reduced sodium, it will have 300 mg of sodium. CHOOSING FOODS Grains  Avoid: Salted crackers and snack items. Bread stuffing and biscuit mixes. Seasoned rice or pasta mixes.  Choose: Unsalted snack items. English muffins, breads, and rolls. Homemade pancakes and waffles. Most cereals. Pasta. Meats  Avoid: Salted, canned, smoked, spiced, pickled meats, including fish and poultry. Bacon, ham, sausage, cold cuts, hot dogs, anchovies.  Choose: Low-sodium canned tuna and salmon. Fresh or frozen meat, poultry, and fish. Dairy  Avoid: Processed cheese and spreads. Cottage cheese. Buttermilk and condensed milk. Regular cheese.  Choose: Milk. Low-sodium cottage cheese. Yogurt. Sour cream. Low-sodium cheese. Fruits and Vegetables  Avoid: Regular canned vegetables. Regular canned tomato sauce and paste. Frozen vegetables in sauces. Olives. Rosita Fire. Relishes. Sauerkraut.  Choose: Low-sodium canned vegetables. Low-sodium tomato sauce and paste. Frozen or fresh vegetables. Fresh and frozen fruit. Condiments  Avoid: Canned and packaged  gravies. Worcestershire sauce. Tartar sauce. Barbecue sauce. Soy sauce. Steak sauce. Ketchup. Onion, garlic, and table salt. Meat flavorings and tenderizers.  Choose: Fresh and dried herbs and spices. Low-sodium varieties of mustard and ketchup. Lemon juice. Tabasco sauce. Horseradish. SAMPLE 3 TO 4 GRAM SODIUM MEAL PLAN  Breakfast / Sodium (mg)  1 cup low-fat milk / 143 mg  2 slices whole-wheat toast / 270 mg  1 tbs heart-healthy margarine / 153 mg  1 hard-boiled egg / 139  mg Lunch / Sodium (mg)  1 cup raw carrots / 76 mg   cup hummus / 298 mg  1 cup low-fat milk / 143 mg   cup red grapes / 2 mg  1 cup low-sodium chicken and rice soup / 480 mg  10 low-sodium saltine crackers / 191 mg Dinner / Sodium (mg)  1 cup whole-wheat pasta / 2 mg  1 cup tomato sauce / 1178 mg  3 oz lean ground beef / 57 mg  1 small side salad (1 cup raw spinach leaves,  cup cucumber,  cup yellow bell pepper) / 25 mg  1 tsp ranch dressing / 144 mg Snack / Sodium (mg)  1 slice cheddar cheese / 258 mg  1 medium apple / 1 mg Nutrient Analysis  Calories: 2005  Protein: 85 g  Carbohydrate: 245 g  Fat: 78 g  Sodium: 3560 mg Document Released: 09/19/2005 Document Revised: 12/12/2011 Document Reviewed: 12/21/2009 Exit[employer] Patient Information 2014 [place], [place].

## 2013-09-27 ENCOUNTER — Ambulatory Visit (INDEPENDENT_AMBULATORY_CARE_PROVIDER_SITE_OTHER): Payer: Medicare Other | Admitting: Family Medicine

## 2013-09-27 ENCOUNTER — Encounter: Payer: Self-pay | Admitting: Family Medicine

## 2013-09-27 VITALS — BP 144/82 | HR 96 | Temp 99.2°F | Wt 134.5 lb

## 2013-09-27 DIAGNOSIS — J111 Influenza due to unidentified influenza virus with other respiratory manifestations: Secondary | ICD-10-CM

## 2013-09-27 MED ORDER — OSELTAMIVIR PHOSPHATE 75 MG PO CAPS: 75.0000 mg | ORAL_CAPSULE | Freq: Two times a day (BID) | ORAL | Status: DC

## 2013-09-27 NOTE — Assessment & Plan Note (Signed)
Clinical diagnosis - did not do nasal swab. In window for tamiflu, discussed pro/cons of med, given age will treat. Red flags to seek urgent care or return to see [place] discussed. Pt agrees with plan. No evidence of bacterial superinfection today.

## 2013-09-27 NOTE — Progress Notes (Signed)
Pre-visit discussion using our clinic review tool. No additional management support is needed unless otherwise documented below in the visit note.  

## 2013-09-27 NOTE — Progress Notes (Signed)
PCP: Eustaquio Boyden, MD  Alexis Bentley is a 67 y.o. female who presents today for routine electrophysiology followup.  Since last being seen in our clinic, the patient reports doing very well.   Today, she denies symptoms of  chest pain, shortness of breath,  lower extremity edema, dizziness, presyncope, or syncope.  The patient is otherwise without complaint today.   Past Medical History  Diagnosis Date  . Paroxysmal atrial fibrillation 2010    one episode  . Hypertension   . Chest pain   . Lumbar back pain     With radiculopathy  . Hemorrhoids   . Hiatal hernia   . Erosive gastritis   . Rectal prolapse   . Peptic ulcer disease   . GERD (gastroesophageal reflux disease)   . Non-ST elevation myocardial infarction (NSTEMI)     likely type 2 - demand ischemia during afib   Past Surgical History  Procedure Laterality Date  . Partial hysterectomy  1981    Bleeding, cyst, bladder tack  . Rectal prolapse repair  2005  . Dexa osteopenia      Hip  . Colonoscopy  12/03/2006    Internal/ external hemorrhoids, no polyps  . Esophagogastroduodenoscopy  04/27/2007    Erosive and ulcerative gastritis; small H.H.  . Coronary angioplasty  08/07/2009    NML  . Total abdominal hysterectomy      With a bilateral salpingo- oophorectomy    Current Outpatient Prescriptions  Medication Sig Dispense Refill  . apixaban (ELIQUIS) 5 MG TABS tablet Take 1 tablet (5 mg total) by mouth 2 (two) times daily.  60 tablet  11  . Calcium Carbonate-Vitamin D (CALTRATE 600+D) 600-400 MG-UNIT per tablet Take 2 tablets by mouth daily.        . hydrochlorothiazide (MICROZIDE) 12.5 MG capsule As needed for swelling  30 capsule  6  . lisinopril (PRINIVIL,ZESTRIL) 20 MG tablet Take 1 tablet (20 mg total) by mouth daily.  90 tablet  3  . metoprolol (LOPRESSOR) 50 MG tablet Take 0.5 tablets (25 mg total) by mouth 2 (two) times daily.  180 tablet  3  . omeprazole (PRILOSEC) 40 MG capsule Take 1 capsule (40 mg total)  by mouth daily.  30 capsule  6  . oseltamivir (TAMIFLU) 75 MG capsule Take 1 capsule (75 mg total) by mouth 2 (two) times daily.  10 capsule  0   No current facility-administered medications for this visit.    Physical Exam: Filed Vitals:   09/18/13 0852  BP: 127/72  Pulse: 55  Height: 5\' 6"  (1.676 m)  Weight: 131 lb (59.421 kg)    GEN- The patient is well appearing, alert and oriented x 3 today.   Head- normocephalic, atraumatic Eyes-  Sclera clear, conjunctiva pink Ears- hearing intact Oropharynx- clear Lungs- Clear to ausculation bilaterally, normal work of breathing Heart- Regular rate and rhythm, no murmurs, rubs or gallops, PMI not laterally displaced GI- soft, NT, ND, + BS Extremities- no clubbing, cyanosis, or edema  ekg today reveals sinus rhythm 55 bpm, otherwise normal ekg  Assessment and Plan:  1. afib Well controlled (only 1 episode) No changes  2. htn Repeat echo to evaluate for structural changes reduced sodium diet  Return in 1 year

## 2013-09-27 NOTE — Progress Notes (Signed)
   Subjective:    Patient ID: Alexis Bentley, female    DOB: 10/24/1945, 67 y.o.   MRN: [id number]  HPI CC: cough, sinus congestion  Alexis Bentley presents today with 1d h/o headache, body aches, mildly productive cough.  Feverish yesterday.  + chills.  Did receive flu shot this year. No sick contacts at home. Has tried so far tylenol. No h/o asthma.  Past Medical History  Diagnosis Date  . Paroxysmal atrial fibrillation 2010    one episode  . Hypertension   . Chest pain   . Lumbar back pain     With radiculopathy  . Hemorrhoids   . Hiatal hernia   . Erosive gastritis   . Rectal prolapse   . Peptic ulcer disease   . GERD (gastroesophageal reflux disease)   . Non-ST elevation myocardial infarction (NSTEMI)     likely type 2 - demand ischemia during afib     Review of Systems Per HPI    Objective:   Physical Exam  Nursing note and vitals reviewed. Constitutional: She appears well-developed and well-nourished. No distress.  HENT:  Head: Normocephalic and atraumatic.  Right Ear: Hearing, tympanic membrane, external ear and ear canal normal.  Left Ear: Hearing, tympanic membrane, external ear and ear canal normal.  Nose: Mucosal edema present. No rhinorrhea. Right sinus exhibits no maxillary sinus tenderness and no frontal sinus tenderness. Left sinus exhibits no maxillary sinus tenderness and no frontal sinus tenderness.  Mouth/Throat: Uvula is midline, oropharynx is clear and moist and mucous membranes are normal. No oropharyngeal exudate, posterior oropharyngeal edema, posterior oropharyngeal erythema or tonsillar abscesses.  Nasal congestion  Eyes: Conjunctivae and EOM are normal. Pupils are equal, round, and reactive to light. No scleral icterus.  Neck: Normal range of motion. Neck supple.  Cardiovascular: Normal rate, regular rhythm, normal heart sounds and intact distal pulses.   No murmur heard. Pulmonary/Chest: Effort normal and breath sounds normal. No respiratory  distress. She has no wheezes. She has no rales.  Hacking cough present  Lymphadenopathy:    She has no cervical adenopathy.  Skin: Skin is warm and dry. No rash noted.       Assessment & Plan:

## 2013-09-27 NOTE — Patient Instructions (Signed)
I think you have the flu - start tamiflu twice daily for 5 days. If worsening instead of improving (or persistent fever >101 or worsening productive cough), please let us know.  Influenza, Adult Influenza ("the flu") is a viral infection of the respiratory tract. It occurs more often in winter months because people spend more time in close contact with one another. Influenza can make you feel very sick. Influenza easily spreads from person to person (contagious). CAUSES  Influenza is caused by a virus that infects the respiratory tract. You can catch the virus by breathing in droplets from an infected person's cough or sneeze. You can also catch the virus by touching something that was recently contaminated with the virus and then touching your mouth, nose, or eyes. SYMPTOMS  Symptoms typically last 4 to 10 days and may include:  Fever.  Chills.  Headache, body aches, and muscle aches.  Sore throat.  Chest discomfort and cough.  Poor appetite.  Weakness or feeling tired.  Dizziness.  Nausea or vomiting. DIAGNOSIS  Diagnosis of influenza is often made based on your history and a physical exam. A nose or throat swab test can be done to confirm the diagnosis. RISKS AND COMPLICATIONS You may be at risk for a more severe case of influenza if you smoke cigarettes, have diabetes, have chronic heart disease (such as heart failure) or lung disease (such as asthma), or if you have a weakened immune system. Elderly people and pregnant women are also at risk for more serious infections. The most common complication of influenza is a lung infection (pneumonia). Sometimes, this complication can require emergency medical care and may be life-threatening. PREVENTION  An annual influenza vaccination (flu shot) is the best way to avoid getting influenza. An annual flu shot is now routinely recommended for all adults in the [place] TREATMENT  In mild cases, influenza goes away on its own. Treatment is  directed at relieving symptoms. For more severe cases, your caregiver may prescribe antiviral medicines to shorten the sickness. Antibiotic medicines are not effective, because the infection is caused by a virus, not by bacteria. HOME CARE INSTRUCTIONS  Only take over-the-counter or prescription medicines for pain, discomfort, or fever as directed by your caregiver.  Use a cool mist humidifier to make breathing easier.  Get plenty of rest until your temperature returns to normal. This usually takes 3 to 4 days.  Drink enough fluids to keep your urine clear or pale yellow.  Cover your mouth and nose when coughing or sneezing, and wash your hands well to avoid spreading the virus.  Stay home from work or school until your fever has been gone for at least 1 full day. SEEK MEDICAL CARE IF:   You have chest pain or a deep cough that worsens or produces more mucus.  You have nausea, vomiting, or diarrhea. SEEK IMMEDIATE MEDICAL CARE IF:   You have difficulty breathing, shortness of breath, or your skin or nails turn bluish.  You have severe neck pain or stiffness.  You have a severe headache, facial pain, or earache.  You have a worsening or recurring fever.  You have nausea or vomiting that cannot be controlled. MAKE SURE YOU:  Understand these instructions.  Will watch your condition.  Will get help right away if you are not doing well or get worse. Document Released: 09/16/2000 Document Revised: 03/20/2012 Document Reviewed: 12/19/2011 [facility] Patient Information 2014 [place], [place].

## 2013-09-29 ENCOUNTER — Encounter: Payer: Self-pay | Admitting: Family Medicine

## 2013-10-03 DIAGNOSIS — R7401 Elevation of levels of liver transaminase levels: Secondary | ICD-10-CM

## 2013-10-03 HISTORY — DX: Elevation of levels of liver transaminase levels: R74.01

## 2013-10-08 ENCOUNTER — Ambulatory Visit (HOSPITAL_COMMUNITY): Payer: Medicare Other | Attending: Internal Medicine | Admitting: Radiology

## 2013-10-08 ENCOUNTER — Encounter: Payer: Self-pay | Admitting: Cardiology

## 2013-10-08 DIAGNOSIS — R55 Syncope and collapse: Secondary | ICD-10-CM

## 2013-10-08 DIAGNOSIS — I252 Old myocardial infarction: Secondary | ICD-10-CM | POA: Diagnosis not present

## 2013-10-08 DIAGNOSIS — I10 Essential (primary) hypertension: Secondary | ICD-10-CM | POA: Diagnosis not present

## 2013-10-08 DIAGNOSIS — I359 Nonrheumatic aortic valve disorder, unspecified: Secondary | ICD-10-CM | POA: Diagnosis not present

## 2013-10-08 DIAGNOSIS — I4891 Unspecified atrial fibrillation: Secondary | ICD-10-CM | POA: Diagnosis not present

## 2013-10-08 DIAGNOSIS — R079 Chest pain, unspecified: Secondary | ICD-10-CM | POA: Diagnosis not present

## 2013-10-08 NOTE — Progress Notes (Signed)
Echocardiogram performed.  

## 2013-10-10 ENCOUNTER — Other Ambulatory Visit: Payer: Self-pay | Admitting: Internal Medicine

## 2013-12-10 ENCOUNTER — Other Ambulatory Visit: Payer: Self-pay | Admitting: Family Medicine

## 2014-01-27 ENCOUNTER — Encounter: Payer: Self-pay | Admitting: Family Medicine

## 2014-01-27 ENCOUNTER — Ambulatory Visit (INDEPENDENT_AMBULATORY_CARE_PROVIDER_SITE_OTHER): Payer: Medicare Other | Admitting: Family Medicine

## 2014-01-27 VITALS — BP 140/82 | HR 64 | Temp 97.8°F | Wt 120.5 lb

## 2014-01-27 DIAGNOSIS — R51 Headache: Secondary | ICD-10-CM

## 2014-01-27 DIAGNOSIS — R232 Flushing: Secondary | ICD-10-CM | POA: Diagnosis not present

## 2014-01-27 DIAGNOSIS — I10 Essential (primary) hypertension: Secondary | ICD-10-CM | POA: Diagnosis not present

## 2014-01-27 LAB — CBC WITH DIFFERENTIAL/PLATELET
BASOS ABS: 0 10*3/uL (ref 0.0–0.1)
Basophils Relative: 0.5 % (ref 0.0–3.0)
Eosinophils Absolute: 0.1 10*3/uL (ref 0.0–0.7)
Eosinophils Relative: 0.8 % (ref 0.0–5.0)
HEMATOCRIT: 42.4 % (ref 36.0–46.0)
Hemoglobin: 14.1 g/dL (ref 12.0–15.0)
LYMPHS ABS: 2.1 10*3/uL (ref 0.7–4.0)
Lymphocytes Relative: 24.4 % (ref 12.0–46.0)
MCHC: 33.3 g/dL (ref 30.0–36.0)
MCV: 91.5 fl (ref 78.0–100.0)
Monocytes Absolute: 0.4 10*3/uL (ref 0.1–1.0)
Monocytes Relative: 4 % (ref 3.0–12.0)
Neutro Abs: 6.1 10*3/uL (ref 1.4–7.7)
Neutrophils Relative %: 70.3 % (ref 43.0–77.0)
PLATELETS: 254 10*3/uL (ref 150.0–400.0)
RBC: 4.64 Mil/uL (ref 3.87–5.11)
RDW: 13.9 % (ref 11.5–14.6)
WBC: 8.7 10*3/uL (ref 4.5–10.5)

## 2014-01-27 LAB — COMPREHENSIVE METABOLIC PANEL
ALBUMIN: 4.4 g/dL (ref 3.5–5.2)
ALT: 51 U/L — AB (ref 0–35)
AST: 41 U/L — ABNORMAL HIGH (ref 0–37)
Alkaline Phosphatase: 100 U/L (ref 39–117)
BUN: 19 mg/dL (ref 6–23)
CHLORIDE: 102 meq/L (ref 96–112)
CO2: 28 meq/L (ref 19–32)
Calcium: 10.3 mg/dL (ref 8.4–10.5)
Creatinine, Ser: 0.8 mg/dL (ref 0.4–1.2)
GFR: 72.15 mL/min (ref >60.00)
Glucose, Bld: 79 mg/dL (ref 70–99)
POTASSIUM: 4.6 meq/L (ref 3.5–5.1)
SODIUM: 140 meq/L (ref 135–145)
TOTAL PROTEIN: 7.9 g/dL (ref 6.0–8.3)
Total Bilirubin: 0.3 mg/dL (ref 0.3–1.2)

## 2014-01-27 LAB — TSH: TSH: 0.49 u[IU]/mL (ref 0.35–5.50)

## 2014-01-27 LAB — T4, FREE: Free T4: 0.93 ng/dL (ref 0.60–1.60)

## 2014-01-27 NOTE — Progress Notes (Signed)
BP 140/82  Pulse 64  Temp(Src) 97.8 F (36.6 C) (Oral)  Wt 120 lb 8 oz (54.658 kg)   CC: discuss episode yesterday associated with hypertension  Subjective:    Patient ID: Alexis Bentley, female    DOB: [birth year], 68 y.o.   MRN: [id number]  HPI: Alexis Bentley is a 68 y.o. female presenting on 01/27/2014 for Hypertension   Pleasant 68yo with h/o HTN and lone episode of atrial fibrillation 2010 followed by Dr. Rayann Heman on Eliquis bid.    Trouble sleeping on Saturday night - with mild headache improved after tylenol.  Headache described as throbbing ache on bilateral posterior of head.  When she awoke felt better but headache developed over time this time at R frontal region, felt lightheaded and mildly nauseated.  Entire face felt tingly.  No vertigo or presyncope.  Checked blood pressure at home - 168/74.  Did come down with time to 150 prior to bedtime on Sunday night.  Stayed mildly nauseated but did feel overall better.  This morning still with slight malaise but overall improved.  BP this morning 138/74.  This episode did not have palpitations or irregular heart betas.  + ?hot flashes more recently where she gets hot and breaks out in sweat all over.  Gets flushed as well.  This happens more regularly over the last month.    Bowels normal.  Denies chest pain, dyspnea, vision changes.  No fatigue or decreased energy noted.  Appetite ok.  Denies any bleeding.  H/o HTN , compliant with metoprolol 25mg  bid and lisinopril 20mg  daily.  Takes hctz 25mg  prn swelling.  Wt Readings from Last 3 Encounters:  01/27/14 120 lb 8 oz (54.658 kg)  09/27/13 134 lb 8 oz (61.009 kg)  09/18/13 131 lb (59.421 kg)  Body mass index is 19.46 kg/(m^2). Has been eating healthier, walking 1 mi daily.  Relevant past medical, surgical, family and social history reviewed and updated as indicated.  Allergies and medications reviewed and updated. Current Outpatient Prescriptions on File Prior to Visit  Medication  Sig  . apixaban (ELIQUIS) 5 MG TABS tablet Take 1 tablet (5 mg total) by mouth 2 (two) times daily.  . Calcium Carbonate-Vitamin D (CALTRATE 600+D) 600-400 MG-UNIT per tablet Take 2 tablets by mouth daily.    Marland Kitchen lisinopril (PRINIVIL,ZESTRIL) 20 MG tablet Take 1 tablet (20 mg total) by mouth daily.  . metoprolol (LOPRESSOR) 50 MG tablet Take 0.5 tablets (25 mg total) by mouth 2 (two) times daily.  Marland Kitchen omeprazole (PRILOSEC) 40 MG capsule TAKE 1 CAPSULE BY MOUTH DAILY  . hydrochlorothiazide (MICROZIDE) 12.5 MG capsule As needed for swelling   No current facility-administered medications on file prior to visit.    Review of Systems Per HPI unless specifically indicated above    Objective:    BP 140/82  Pulse 64  Temp(Src) 97.8 F (36.6 C) (Oral)  Wt 120 lb 8 oz (54.658 kg)  Physical Exam  Nursing note and vitals reviewed. Constitutional: She appears well-developed and well-nourished. No distress.  HENT:  Head: Normocephalic and atraumatic.  Mouth/Throat: Oropharynx is clear and moist. No oropharyngeal exudate.  Eyes: Conjunctivae and EOM are normal. Pupils are equal, round, and reactive to light. No scleral icterus.  Neck: Normal range of motion. Neck supple. Carotid bruit is not present. No thyromegaly present.  Cardiovascular: Normal rate, regular rhythm, normal heart sounds and intact distal pulses.   No murmur heard. Pulmonary/Chest: Effort normal and breath sounds normal. No respiratory distress.  She has no wheezes. She has no rales.  Abdominal: Soft. Bowel sounds are normal. She exhibits no distension and no mass. There is no tenderness. There is no rebound and no guarding.  Musculoskeletal: She exhibits no edema.  Lymphadenopathy:    She has no cervical adenopathy.  Skin: Skin is warm and dry. No rash noted.       Assessment & Plan:   Problem List Items Addressed This Visit   HYPERTENSION - Primary     Stable today - continue meds.    Relevant Orders      TSH       Comprehensive metabolic panel      T4, Free      Catecholamines, fractionated, urine, 24 hour   Hot flash not due to menopause     Episode of headache, hypertension, and sweating - will need to eval for pheochromocytoma. With hx hypertension and hx afib but currently stable. Will also check blood work today including CBC, CMP and TSH. Will call with results.    Relevant Orders      Catecholamines, fractionated, urine, 24 hour    Other Visit Diagnoses   Headache(784.0)        Relevant Orders       CBC with Differential       Catecholamines, fractionated, urine, 24 hour    Flushing        Relevant Orders       Catecholamines, fractionated, urine, 24 hour        Follow up plan: Return if symptoms worsen or fail to improve.

## 2014-01-27 NOTE — Assessment & Plan Note (Signed)
Episode of headache, hypertension, and sweating - will need to eval for pheochromocytoma. With hx hypertension and hx afib but currently stable. Will also check blood work today including CBC, CMP and TSH. Will call with results.

## 2014-01-27 NOTE — Assessment & Plan Note (Signed)
Stable today - continue meds.

## 2014-01-27 NOTE — Patient Instructions (Signed)
Blood work today.  We will call you with results. One urine test today - return after collecting urine for 24 hours.  This one will likely take longer to return. In the meantime, continue meds as up to now, ensure you stay well hydrated, and let me know if new symptoms develop or any worsening. Good to see you today, call us with questions.

## 2014-01-27 NOTE — Progress Notes (Signed)
Pre visit review using our clinic review tool, if applicable. No additional management support is needed unless otherwise documented below in the visit note. 

## 2014-01-28 ENCOUNTER — Telehealth: Payer: Self-pay | Admitting: Family Medicine

## 2014-01-28 NOTE — Telephone Encounter (Signed)
Relevant patient education assigned to patient using Emmi. ° °

## 2014-01-29 DIAGNOSIS — R51 Headache: Secondary | ICD-10-CM | POA: Diagnosis not present

## 2014-01-29 DIAGNOSIS — I10 Essential (primary) hypertension: Secondary | ICD-10-CM | POA: Diagnosis not present

## 2014-01-29 DIAGNOSIS — R232 Flushing: Secondary | ICD-10-CM | POA: Diagnosis not present

## 2014-01-29 NOTE — Addendum Note (Signed)
Addended by: Marchia Bond on: 01/29/2014 11:48 AM   Modules accepted: Orders

## 2014-02-02 LAB — CATECHOLAMINES, FRACTIONATED, URINE, 24 HOUR
CALCULATED TOTAL (E+ NE): 52 ug/(24.h) (ref 26–121)
Creatinine, Urine mg/day-CATEUR: 0.9 g/(24.h) (ref 0.63–2.50)
DOPAMINE, 24 HR URINE: 215 ug/(24.h) (ref 52–480)
Norepinephrine, 24 hr Ur: 52 mcg/24 h (ref 15–100)
TOTAL VOLUME - CF 24HR U: 1700 mL

## 2014-02-04 ENCOUNTER — Other Ambulatory Visit: Payer: Self-pay | Admitting: Family Medicine

## 2014-02-04 DIAGNOSIS — R748 Abnormal levels of other serum enzymes: Secondary | ICD-10-CM

## 2014-02-10 ENCOUNTER — Other Ambulatory Visit: Payer: Medicare Other

## 2014-02-11 ENCOUNTER — Other Ambulatory Visit (INDEPENDENT_AMBULATORY_CARE_PROVIDER_SITE_OTHER): Payer: Medicare Other

## 2014-02-11 DIAGNOSIS — R748 Abnormal levels of other serum enzymes: Secondary | ICD-10-CM | POA: Diagnosis not present

## 2014-02-11 LAB — HEPATIC FUNCTION PANEL
ALBUMIN: 3.9 g/dL (ref 3.5–5.2)
ALT: 65 U/L — AB (ref 0–35)
AST: 66 U/L — ABNORMAL HIGH (ref 0–37)
Alkaline Phosphatase: 114 U/L (ref 39–117)
Bilirubin, Direct: 0 mg/dL (ref 0.0–0.3)
TOTAL PROTEIN: 6.9 g/dL (ref 6.0–8.3)
Total Bilirubin: 0.5 mg/dL (ref 0.2–1.2)

## 2014-02-12 ENCOUNTER — Other Ambulatory Visit: Payer: Self-pay | Admitting: Family Medicine

## 2014-02-12 ENCOUNTER — Encounter: Payer: Self-pay | Admitting: *Deleted

## 2014-02-12 DIAGNOSIS — R740 Nonspecific elevation of levels of transaminase and lactic acid dehydrogenase [LDH]: Principal | ICD-10-CM

## 2014-02-12 DIAGNOSIS — R7401 Elevation of levels of liver transaminase levels: Secondary | ICD-10-CM

## 2014-03-18 ENCOUNTER — Other Ambulatory Visit (INDEPENDENT_AMBULATORY_CARE_PROVIDER_SITE_OTHER): Payer: Medicare Other

## 2014-03-18 DIAGNOSIS — R7401 Elevation of levels of liver transaminase levels: Secondary | ICD-10-CM

## 2014-03-18 DIAGNOSIS — R7402 Elevation of levels of lactic acid dehydrogenase (LDH): Secondary | ICD-10-CM | POA: Diagnosis not present

## 2014-03-18 DIAGNOSIS — R740 Nonspecific elevation of levels of transaminase and lactic acid dehydrogenase [LDH]: Principal | ICD-10-CM

## 2014-03-18 LAB — HEPATIC FUNCTION PANEL
ALK PHOS: 102 U/L (ref 39–117)
ALT: 47 U/L — ABNORMAL HIGH (ref 0–35)
AST: 48 U/L — ABNORMAL HIGH (ref 0–37)
Albumin: 4.2 g/dL (ref 3.5–5.2)
Bilirubin, Direct: 0 mg/dL (ref 0.0–0.3)
TOTAL PROTEIN: 7.4 g/dL (ref 6.0–8.3)
Total Bilirubin: 0.3 mg/dL (ref 0.2–1.2)

## 2014-03-18 LAB — IBC PANEL
Iron: 88 ug/dL (ref 42–145)
SATURATION RATIOS: 24.7 % (ref 20.0–50.0)
Transferrin: 254 mg/dL (ref 212.0–360.0)

## 2014-03-19 LAB — HEPATITIS PANEL, ACUTE
HCV AB: NEGATIVE
HEP A IGM: NONREACTIVE
HEP B C IGM: NONREACTIVE
Hepatitis B Surface Ag: NEGATIVE

## 2014-03-21 ENCOUNTER — Other Ambulatory Visit: Payer: Self-pay | Admitting: Family Medicine

## 2014-03-21 DIAGNOSIS — R740 Nonspecific elevation of levels of transaminase and lactic acid dehydrogenase [LDH]: Principal | ICD-10-CM

## 2014-03-21 DIAGNOSIS — K824 Cholesterolosis of gallbladder: Secondary | ICD-10-CM

## 2014-03-21 DIAGNOSIS — R7401 Elevation of levels of liver transaminase levels: Secondary | ICD-10-CM

## 2014-03-31 ENCOUNTER — Ambulatory Visit
Admission: RE | Admit: 2014-03-31 | Discharge: 2014-03-31 | Disposition: A | Discharge status: Home / Self Care | Payer: Medicare Other | Source: Ambulatory Visit | Attending: Family Medicine | Admitting: Family Medicine

## 2014-03-31 DIAGNOSIS — K824 Cholesterolosis of gallbladder: Secondary | ICD-10-CM | POA: Diagnosis not present

## 2014-03-31 DIAGNOSIS — R740 Nonspecific elevation of levels of transaminase and lactic acid dehydrogenase [LDH]: Principal | ICD-10-CM

## 2014-03-31 DIAGNOSIS — R7401 Elevation of levels of liver transaminase levels: Secondary | ICD-10-CM

## 2014-04-02 ENCOUNTER — Encounter: Payer: Self-pay | Admitting: *Deleted

## 2014-04-02 LAB — HM MAMMOGRAPHY: HM MAMMO: NORMAL

## 2014-04-16 ENCOUNTER — Other Ambulatory Visit: Payer: Self-pay | Admitting: Family Medicine

## 2014-06-02 DIAGNOSIS — Z1231 Encounter for screening mammogram for malignant neoplasm of breast: Secondary | ICD-10-CM | POA: Diagnosis not present

## 2014-07-10 ENCOUNTER — Ambulatory Visit (INDEPENDENT_AMBULATORY_CARE_PROVIDER_SITE_OTHER): Payer: Medicare Other | Admitting: Family Medicine

## 2014-07-10 ENCOUNTER — Encounter: Payer: Self-pay | Admitting: Family Medicine

## 2014-07-10 VITALS — BP 132/64 | HR 60 | Temp 97.6°F | Wt 129.5 lb

## 2014-07-10 DIAGNOSIS — Z23 Encounter for immunization: Secondary | ICD-10-CM

## 2014-07-10 DIAGNOSIS — R102 Pelvic and perineal pain: Secondary | ICD-10-CM | POA: Insufficient documentation

## 2014-07-10 DIAGNOSIS — N3000 Acute cystitis without hematuria: Secondary | ICD-10-CM | POA: Diagnosis not present

## 2014-07-10 DIAGNOSIS — R300 Dysuria: Secondary | ICD-10-CM | POA: Diagnosis not present

## 2014-07-10 LAB — POCT URINALYSIS DIPSTICK
Bilirubin, UA: NEGATIVE
GLUCOSE UA: NEGATIVE
KETONES UA: NEGATIVE
Leukocytes, UA: NEGATIVE
Nitrite, UA: NEGATIVE
Protein, UA: NEGATIVE
RBC UA: NEGATIVE
Spec Grav, UA: 1.01
Urobilinogen, UA: 0.2
pH, UA: 6.5

## 2014-07-10 MED ORDER — SULFAMETHOXAZOLE-TMP DS 800-160 MG PO TABS: 1.0000 | ORAL_TABLET | Freq: Two times a day (BID) | ORAL | Status: DC

## 2014-07-10 NOTE — Assessment & Plan Note (Signed)
UA by automatic dip was normal, but by manual dip concerning for LE and blood so microscopy obtained - overall ok. However she did have typical UTI sxs.  Regardless, now sxs have largely resolved. If any recurrent sxs, fill bactrim course. Otherwise continue pushing fluids and rest. Pt agrees with plan.

## 2014-07-10 NOTE — Progress Notes (Signed)
   BP 132/64  Pulse 60  Temp(Src) 97.6 F (36.4 C) (Oral)  Wt 129 lb 8 oz (58.741 kg)   CC: ?cystitis  Subjective:    Patient ID: Alexis Bentley, female    DOB: 06-09-1946, 68 y.o.   MRN: [id number]  HPI: Alexis Bentley is a 68 y.o. female presenting on 07/10/2014 for Urinary Tract Infection   1 wk h/o suprapubic pressure, discomfort at end of stream. Self treated with azo on Monday which has helped sxs. + frequency and urgency. No hematuria, flank pain, nausea or fevers.  Has increased water.   Relevant past medical, surgical, family and social history reviewed and updated as indicated.  Allergies and medications reviewed and updated. Current Outpatient Prescriptions on File Prior to Visit  Medication Sig  . apixaban (ELIQUIS) 5 MG TABS tablet Take 1 tablet (5 mg total) by mouth 2 (two) times daily.  . Calcium Carbonate-Vitamin D (CALTRATE 600+D) 600-400 MG-UNIT per tablet Take 2 tablets by mouth daily.    . hydrochlorothiazide (MICROZIDE) 12.5 MG capsule As needed for swelling  . lisinopril (PRINIVIL,ZESTRIL) 20 MG tablet Take 1 tablet (20 mg total) by mouth daily.  . metoprolol (LOPRESSOR) 50 MG tablet Take 0.5 tablets (25 mg total) by mouth 2 (two) times daily.  Marland Kitchen omeprazole (PRILOSEC) 40 MG capsule TAKE 1 CAPSULE BY MOUTH DAILY.   No current facility-administered medications on file prior to visit.    Review of Systems Per HPI unless specifically indicated above    Objective:    BP 132/64  Pulse 60  Temp(Src) 97.6 F (36.4 C) (Oral)  Wt 129 lb 8 oz (58.741 kg)  Physical Exam  Nursing note and vitals reviewed. Constitutional: She appears well-developed and well-nourished. No distress.  Abdominal: Soft. Normal appearance and bowel sounds are normal. She exhibits no distension and no mass. There is no tenderness. There is no rigidity, no rebound, no guarding, no CVA tenderness and negative Murphy's sign.  Musculoskeletal: She exhibits no edema.  Psychiatric: She has a  normal mood and affect.       Assessment & Plan:   Problem List Items Addressed This Visit   Acute cystitis     UA by automatic dip was normal, but by manual dip concerning for LE and blood so microscopy obtained - overall ok. However she did have typical UTI sxs.  Regardless, now sxs have largely resolved. If any recurrent sxs, fill bactrim course. Otherwise continue pushing fluids and rest. Pt agrees with plan.     Other Visit Diagnoses   Dysuria    -  Primary    Relevant Orders       POCT Urinalysis Dipstick (Completed)        Follow up plan: No Follow-up on file.

## 2014-07-10 NOTE — Patient Instructions (Addendum)
Flu shot today Urine looking ok today. Continue pushing water and rest, may try cranberry juice as well. If symptoms recur, fill antibiotic provided today.

## 2014-07-10 NOTE — Addendum Note (Signed)
Addended by: Royann Shivers A on: 07/10/2014 10:49 AM   Modules accepted: Orders

## 2014-07-10 NOTE — Progress Notes (Signed)
Pre visit review using our clinic review tool, if applicable. No additional management support is needed unless otherwise documented below in the visit note. 

## 2014-07-31 ENCOUNTER — Telehealth: Payer: Self-pay

## 2014-07-31 NOTE — Telephone Encounter (Signed)
Patient call to get samples of eliquis 5 mg placed a month of samples and a 30 day copay card

## 2014-08-19 ENCOUNTER — Other Ambulatory Visit: Payer: Self-pay | Admitting: Family Medicine

## 2014-10-06 ENCOUNTER — Ambulatory Visit (INDEPENDENT_AMBULATORY_CARE_PROVIDER_SITE_OTHER): Payer: Medicare Other | Admitting: Internal Medicine

## 2014-10-06 ENCOUNTER — Encounter: Payer: Self-pay | Admitting: Internal Medicine

## 2014-10-06 VITALS — BP 142/74 | HR 59 | Ht 65.0 in | Wt 134.2 lb

## 2014-10-06 DIAGNOSIS — I10 Essential (primary) hypertension: Secondary | ICD-10-CM | POA: Diagnosis not present

## 2014-10-06 DIAGNOSIS — I480 Paroxysmal atrial fibrillation: Secondary | ICD-10-CM | POA: Diagnosis not present

## 2014-10-06 MED ORDER — METOPROLOL TARTRATE 50 MG PO TABS: 25.0000 mg | ORAL_TABLET | Freq: Two times a day (BID) | ORAL | Status: DC

## 2014-10-06 MED ORDER — LISINOPRIL 20 MG PO TABS: 20.0000 mg | ORAL_TABLET | Freq: Every day | ORAL | Status: DC

## 2014-10-06 MED ORDER — APIXABAN 5 MG PO TABS: 5.0000 mg | ORAL_TABLET | Freq: Two times a day (BID) | ORAL | Status: DC

## 2014-10-06 NOTE — Patient Instructions (Signed)
Your physician wants you to follow-up in: 1 year with Dr. Rayann Heman. You will receive a reminder letter in the mail two months in advance. If you don't receive a letter, please call our office to schedule the follow-up appointment.  Your physician recommends that you continue on your current medications as directed. Please refer to the Current Medication list given to you today.

## 2014-10-06 NOTE — Progress Notes (Signed)
PCP: Ria Bush, MD  Alexis Bentley is a 69 y.o. female who presents today for routine electrophysiology followup.  Since last being seen in our clinic, the patient reports doing very well.  She has rare palpitations (several seconds) but no prolonged AF.  Today, she denies symptoms of  chest pain, shortness of breath, lower extremity edema, dizziness, presyncope, or syncope.  + hot flashes,  + elevated LFTs followed by PCP.  The patient is otherwise without complaint today.   Past Medical History  Diagnosis Date  . Paroxysmal atrial fibrillation 2010    one episode  . Hypertension   . Chest pain   . Lumbar back pain     With radiculopathy  . Hemorrhoids   . Hiatal hernia   . Erosive gastritis   . Rectal prolapse   . Peptic ulcer disease   . GERD (gastroesophageal reflux disease)   . Non-ST elevation myocardial infarction (NSTEMI) 2010    likely type 2 - demand ischemia during afib   Past Surgical History  Procedure Laterality Date  . Partial hysterectomy  1981    Bleeding, cyst, bladder tack  . Rectal prolapse repair  2005  . Dexa osteopenia      Hip  . Colonoscopy  12/03/2006    Internal/ external hemorrhoids, no polyps  . Esophagogastroduodenoscopy  04/27/2007    Erosive and ulcerative gastritis; small H.H.  . Coronary angioplasty  08/07/2009    NML  . Total abdominal hysterectomy      With a bilateral salpingo- oophorectomy    Current Outpatient Prescriptions  Medication Sig Dispense Refill  . apixaban (ELIQUIS) 5 MG TABS tablet Take 1 tablet (5 mg total) by mouth 2 (two) times daily. 60 tablet 11  . Calcium Carbonate-Vitamin D (CALTRATE 600+D) 600-400 MG-UNIT per tablet Take 2 tablets by mouth daily.      Marland Kitchen lisinopril (PRINIVIL,ZESTRIL) 20 MG tablet Take 1 tablet (20 mg total) by mouth daily. 30 tablet 11  . metoprolol (LOPRESSOR) 50 MG tablet Take 0.5 tablets (25 mg total) by mouth 2 (two) times daily. 60 tablet 6  . omeprazole (PRILOSEC) 40 MG capsule TAKE 1  CAPSULE BY MOUTH EVERY DAY 30 capsule 3  . hydrochlorothiazide (MICROZIDE) 12.5 MG capsule As needed for swelling (Patient not taking: Reported on 10/06/2014) 30 capsule 6   No current facility-administered medications for this visit.   ROS- all systems are reviewed and negative except as per HPI above  Physical Exam: Filed Vitals:   10/06/14 1507  BP: 142/74  Pulse: 59  Height: 5\' 5"  (1.651 m)  Weight: 134 lb 3.2 oz (60.873 kg)    GEN- The patient is well appearing, alert and oriented x 3 today.   Head- normocephalic, atraumatic Eyes-  Sclera clear, conjunctiva pink Ears- hearing intact Oropharynx- clear Lungs- Clear to ausculation bilaterally, normal work of breathing Heart- Regular rate and rhythm, no murmurs, rubs or gallops, PMI not laterally displaced GI- soft, NT, ND, + BS Extremities- no clubbing, cyanosis, or edema  ekg today reveals sinus rhythm 59 bpm, otherwise normal ekg  Assessment and Plan:  1. afib Well controlled (only 1 episode previously) chads2vasc score is at least 3.  Continue eliquis. No changes  2. htn Stable No change required today  3. Palpitations Likely due to pacs, pvcs She will take an additional 25mg  metoprolol prn  Return in 1 year

## 2014-10-11 ENCOUNTER — Encounter: Payer: Self-pay | Admitting: Family Medicine

## 2014-10-11 DIAGNOSIS — R740 Nonspecific elevation of levels of transaminase and lactic acid dehydrogenase [LDH]: Secondary | ICD-10-CM

## 2014-10-11 DIAGNOSIS — R7401 Elevation of levels of liver transaminase levels: Secondary | ICD-10-CM | POA: Insufficient documentation

## 2014-12-17 ENCOUNTER — Other Ambulatory Visit: Payer: Self-pay | Admitting: Family Medicine

## 2015-01-07 ENCOUNTER — Encounter: Payer: Self-pay | Admitting: Family Medicine

## 2015-01-07 ENCOUNTER — Ambulatory Visit (INDEPENDENT_AMBULATORY_CARE_PROVIDER_SITE_OTHER): Payer: Medicare Other | Admitting: Family Medicine

## 2015-01-07 VITALS — BP 110/60 | HR 60 | Temp 98.0°F | Wt 130.8 lb

## 2015-01-07 DIAGNOSIS — M8580 Other specified disorders of bone density and structure, unspecified site: Secondary | ICD-10-CM

## 2015-01-07 DIAGNOSIS — R740 Nonspecific elevation of levels of transaminase and lactic acid dehydrogenase [LDH]: Secondary | ICD-10-CM

## 2015-01-07 DIAGNOSIS — M810 Age-related osteoporosis without current pathological fracture: Secondary | ICD-10-CM

## 2015-01-07 DIAGNOSIS — R7401 Elevation of levels of liver transaminase levels: Secondary | ICD-10-CM

## 2015-01-07 DIAGNOSIS — I10 Essential (primary) hypertension: Secondary | ICD-10-CM | POA: Diagnosis not present

## 2015-01-07 DIAGNOSIS — K219 Gastro-esophageal reflux disease without esophagitis: Secondary | ICD-10-CM | POA: Diagnosis not present

## 2015-01-07 HISTORY — DX: Age-related osteoporosis without current pathological fracture: M81.0

## 2015-01-07 LAB — COMPREHENSIVE METABOLIC PANEL
ALBUMIN: 4.4 g/dL (ref 3.5–5.2)
ALK PHOS: 109 U/L (ref 39–117)
ALT: 34 U/L (ref 0–35)
AST: 34 U/L (ref 0–37)
BILIRUBIN TOTAL: 0.4 mg/dL (ref 0.2–1.2)
BUN: 23 mg/dL (ref 6–23)
CO2: 29 mEq/L (ref 19–32)
CREATININE: 0.92 mg/dL (ref 0.40–1.20)
Calcium: 10.1 mg/dL (ref 8.4–10.5)
Chloride: 102 mEq/L (ref 96–112)
GFR: 64.78 mL/min (ref >60.00)
Glucose, Bld: 82 mg/dL (ref 70–99)
POTASSIUM: 4.4 meq/L (ref 3.5–5.1)
Sodium: 138 mEq/L (ref 135–145)
Total Protein: 7.9 g/dL (ref 6.0–8.3)

## 2015-01-07 LAB — LIPID PANEL
CHOLESTEROL: 214 mg/dL — AB (ref 0–200)
HDL: 86 mg/dL (ref >39.00)
LDL Cholesterol: 111 mg/dL — ABNORMAL HIGH (ref 0–99)
NONHDL: 128
Total CHOL/HDL Ratio: 2
Triglycerides: 87 mg/dL (ref 0.0–149.0)
VLDL: 17.4 mg/dL (ref 0.0–40.0)

## 2015-01-07 LAB — CBC WITH DIFFERENTIAL/PLATELET
BASOS ABS: 0 10*3/uL (ref 0.0–0.1)
Basophils Relative: 0.5 % (ref 0.0–3.0)
EOS PCT: 1 % (ref 0.0–5.0)
Eosinophils Absolute: 0.1 10*3/uL (ref 0.0–0.7)
HCT: 40.3 % (ref 36.0–46.0)
Hemoglobin: 13.8 g/dL (ref 12.0–15.0)
Lymphocytes Relative: 27.5 % (ref 12.0–46.0)
Lymphs Abs: 2.1 10*3/uL (ref 0.7–4.0)
MCHC: 34.2 g/dL (ref 30.0–36.0)
MCV: 90.4 fl (ref 78.0–100.0)
MONOS PCT: 6.4 % (ref 3.0–12.0)
Monocytes Absolute: 0.5 10*3/uL (ref 0.1–1.0)
NEUTROS PCT: 64.6 % (ref 43.0–77.0)
Neutro Abs: 5 10*3/uL (ref 1.4–7.7)
Platelets: 283 10*3/uL (ref 150.0–400.0)
RBC: 4.45 Mil/uL (ref 3.87–5.11)
RDW: 13.5 % (ref 11.5–15.5)
WBC: 7.7 10*3/uL (ref 4.0–10.5)

## 2015-01-07 LAB — TSH: TSH: 1.55 u[IU]/mL (ref 0.35–4.50)

## 2015-01-07 MED ORDER — SCOPOLAMINE 1 MG/3DAYS TD PT72: 1.0000 | MEDICATED_PATCH | TRANSDERMAL | Status: DC

## 2015-01-07 MED ORDER — OMEPRAZOLE 40 MG PO CPDR: DELAYED_RELEASE_CAPSULE | ORAL | Status: DC

## 2015-01-07 NOTE — Assessment & Plan Note (Signed)
Chronic, stable. Continue regimen. 

## 2015-01-07 NOTE — Progress Notes (Signed)
BP 110/60 mmHg  Pulse 60  Temp(Src) 98 F (36.7 C) (Oral)  Wt 130 lb 12 oz (59.308 kg)   CC: med refill visit  Subjective:    Patient ID: Alexis Bentley, female    DOB: 02/08/1946, 69 y.o.   MRN: [id number]  HPI: Alexis Bentley is a 69 y.o. female presenting on 01/07/2015 for Medication Refill   Pleasant 69yo with h/o HTN and lone episode of atrial fibrillation 2010 followed by Dr. Rayann Heman on Eliquis bid. Saw him in January.  HTN - Compliant with current antihypertensive regimen of lisinopril 20mg  daily, metoprolol 25mg  bid, and hctz prn leg swelling. Does check blood pressures at home: running well controlled. No low blood pressure readings or symptoms of dizziness/syncope. Denies HA, vision changes, CP/tightness, SOB, leg swelling. A few times a week while resting notices occasional heart palpitations.   GERD - on omeprazole 40mg  daily. Occasional nocturnal emesis. Weight stable. No dysphagia. No early satiety.   Osteopenia - by prior DEXA. None recently. Taking calcium + vit D bid.   Occasional nausea with plane rides - upcoming trip to [place] 02/2015. Requests scopalamine patch again.   Relevant past medical, surgical, family and social history reviewed and updated as indicated. Interim medical history since our last visit reviewed. Allergies and medications reviewed and updated. Current Outpatient Prescriptions on File Prior to Visit  Medication Sig  . apixaban (ELIQUIS) 5 MG TABS tablet Take 1 tablet (5 mg total) by mouth 2 (two) times daily.  . Calcium Carbonate-Vitamin D (CALTRATE 600+D) 600-400 MG-UNIT per tablet Take 2 tablets by mouth daily.    Marland Kitchen lisinopril (PRINIVIL,ZESTRIL) 20 MG tablet Take 1 tablet (20 mg total) by mouth daily.  . metoprolol (LOPRESSOR) 50 MG tablet Take 0.5 tablets (25 mg total) by mouth 2 (two) times daily.  . hydrochlorothiazide (MICROZIDE) 12.5 MG capsule As needed for swelling (Patient not taking: Reported on 10/06/2014)   No current  facility-administered medications on file prior to visit.    Review of Systems Per HPI unless specifically indicated above     Objective:    BP 110/60 mmHg  Pulse 60  Temp(Src) 98 F (36.7 C) (Oral)  Wt 130 lb 12 oz (59.308 kg)  Wt Readings from Last 3 Encounters:  01/07/15 130 lb 12 oz (59.308 kg)  10/06/14 134 lb 3.2 oz (60.873 kg)  07/10/14 129 lb 8 oz (58.741 kg)    Physical Exam  Constitutional: She appears well-developed and well-nourished. No distress.  HENT:  Mouth/Throat: Oropharynx is clear and moist. No oropharyngeal exudate.  Eyes: Conjunctivae and EOM are normal. Pupils are equal, round, and reactive to light. No scleral icterus.  Cardiovascular: Normal rate, regular rhythm, normal heart sounds and intact distal pulses.   No murmur heard. Pulmonary/Chest: Effort normal and breath sounds normal. No respiratory distress. She has no wheezes. She has no rales.  Abdominal: Soft. Bowel sounds are normal. She exhibits no distension and no mass. There is no tenderness. There is no rebound and no guarding.  Musculoskeletal: She exhibits no edema.  Skin: Skin is warm and dry. No rash noted.  Nursing note and vitals reviewed.      Assessment & Plan:  Scopolamine patch prescribed for upcoming trip. Problem List Items Addressed This Visit    Transaminitis    Recheck today.      Relevant Orders   Comprehensive metabolic panel   CBC with Differential/Platelet   Osteopenia    Reschedule dexa.  Relevant Orders   TSH   DG Bone Density   GERD    Recent isolated episodes of nausea/vomiting. In history of PUD, erosive gastritis and HH, continue PPI daily - omeprazole. Check labs today.      Relevant Medications   omeprazole (PRILOSEC) capsule   scopolamine (TRANSDERM-SCOP) 1 MG/3DAYS   Essential hypertension - Primary    Chronic, stable. Continue regimen.      Relevant Orders   Lipid panel   Comprehensive metabolic panel   TSH       Follow up  plan: Return in about 6 months (around 07/09/2015), or as needed, for medicare wellness visit.

## 2015-01-07 NOTE — Assessment & Plan Note (Signed)
Reschedule dexa.

## 2015-01-07 NOTE — Assessment & Plan Note (Signed)
Recheck today. 

## 2015-01-07 NOTE — Assessment & Plan Note (Signed)
Recent isolated episodes of nausea/vomiting. In history of PUD, erosive gastritis and HH, continue PPI daily - omeprazole. Check labs today.

## 2015-01-07 NOTE — Progress Notes (Signed)
Pre visit review using our clinic review tool, if applicable. No additional management support is needed unless otherwise documented below in the visit note. 

## 2015-01-07 NOTE — Patient Instructions (Addendum)
Pass by Marion's office to discuss scheduling bone density scan as we're due. Scopolamine patch and omeprazole refilled today. Let me know if persistent nausea/vomiting.

## 2015-01-12 ENCOUNTER — Encounter: Payer: Self-pay | Admitting: Family Medicine

## 2015-01-12 ENCOUNTER — Encounter: Payer: Self-pay | Admitting: *Deleted

## 2015-03-04 ENCOUNTER — Encounter: Payer: Self-pay | Admitting: Family Medicine

## 2015-03-04 ENCOUNTER — Ambulatory Visit (INDEPENDENT_AMBULATORY_CARE_PROVIDER_SITE_OTHER): Payer: Medicare Other | Admitting: Family Medicine

## 2015-03-04 VITALS — BP 124/74 | HR 61 | Wt 129.5 lb

## 2015-03-04 DIAGNOSIS — R55 Syncope and collapse: Secondary | ICD-10-CM | POA: Diagnosis not present

## 2015-03-04 DIAGNOSIS — I10 Essential (primary) hypertension: Secondary | ICD-10-CM | POA: Diagnosis not present

## 2015-03-04 HISTORY — DX: Syncope and collapse: R55

## 2015-03-04 HISTORY — PX: OTHER SURGICAL HISTORY: SHX169

## 2015-03-04 NOTE — Progress Notes (Signed)
BP 124/74 mmHg  Pulse 61  Wt 129 lb 8 oz (58.741 kg)  SpO2 99%   CC: passing out  Subjective:    Patient ID: Alexis Bentley, female    DOB: [birth year], 69 y.o.   MRN: [id number]  HPI: Alexis Bentley is a 69 y.o. female presenting on 03/04/2015 for Loss of Consciousness   Pleasant pt with h/o lone afib 2010 on eliquis, NSTEMI during afib episode, HTN, erosive gastritis/PUD, and osteopenia presents with an episode several weeks ago while at a restaurant in [place] where she started feeling nauseated and diaphoretic. Got up to go to bathroom, passed out in bathroom. Fall was witnessed, someone caught her and went to find husband. Out very short amount of time. Not post ictal, no loss of bladder control, no tongue biting. Did vomit x1 after this. No diarrhea or abd pain.   Did not feel palpitations, no chest pain, no dyspnea, no headache. No slurred speech, numbness, unilateral weakness. No blood loss noted.  She did have similar episode 08/2013 of syncope associated with diaphoresis, at that time metoprolol was decreased to 25mg  bid.  Relevant past medical, surgical, family and social history reviewed and updated as indicated. Interim medical history since our last visit reviewed. Allergies and medications reviewed and updated. Current Outpatient Prescriptions on File Prior to Visit  Medication Sig  . apixaban (ELIQUIS) 5 MG TABS tablet Take 1 tablet (5 mg total) by mouth 2 (two) times daily.  . Calcium Carbonate-Vitamin D (CALTRATE 600+D) 600-400 MG-UNIT per tablet Take 2 tablets by mouth daily.    Marland Kitchen lisinopril (PRINIVIL,ZESTRIL) 20 MG tablet Take 1 tablet (20 mg total) by mouth daily.  . metoprolol (LOPRESSOR) 50 MG tablet Take 0.5 tablets (25 mg total) by mouth 2 (two) times daily.  Marland Kitchen omeprazole (PRILOSEC) 40 MG capsule TAKE 1 CAPSULE BY MOUTH EVERY DAY  . hydrochlorothiazide (MICROZIDE) 12.5 MG capsule As needed for swelling (Patient not taking: Reported on 10/06/2014)   No current  facility-administered medications on file prior to visit.    Review of Systems Per HPI unless specifically indicated above     Objective:    BP 124/74 mmHg  Pulse 61  Wt 129 lb 8 oz (58.741 kg)  SpO2 99%  Wt Readings from Last 3 Encounters:  03/04/15 129 lb 8 oz (58.741 kg)  01/07/15 130 lb 12 oz (59.308 kg)  10/06/14 134 lb 3.2 oz (60.873 kg)    Physical Exam  Constitutional: She is oriented to person, place, and time. She appears well-developed and well-nourished. No distress.  HENT:  Mouth/Throat: Oropharynx is clear and moist. No oropharyngeal exudate.  Eyes: Conjunctivae and EOM are normal. Pupils are equal, round, and reactive to light. No scleral icterus.  Neck: Normal range of motion. Neck supple. Carotid bruit is not present. No thyromegaly present.  Cardiovascular: Normal rate, regular rhythm, normal heart sounds and intact distal pulses.   No murmur heard. Pulmonary/Chest: Effort normal and breath sounds normal. No respiratory distress. She has no wheezes. She has no rales.  Musculoskeletal: She exhibits no edema.  Lymphadenopathy:    She has no cervical adenopathy.  Neurological: She is alert and oriented to person, place, and time. She has normal strength. No cranial nerve deficit or sensory deficit. She displays a negative Romberg sign. Coordination and gait normal.  CN 2-12 intact FTN intact No pronator drift  Skin: Skin is warm and dry. No rash noted.  Psychiatric: She has a normal mood and affect.  Nursing note and vitals reviewed.  Results for orders placed or performed in visit on 01/07/15  Lipid panel  Result Value Ref Range   Cholesterol 214 (H) 0 - 200 mg/dL   Triglycerides 87.0 0.0 - 149.0 mg/dL   HDL 86.00 >39.00 mg/dL   VLDL 17.4 0.0 - 40.0 mg/dL   LDL Cholesterol 111 (H) 0 - 99 mg/dL   Total CHOL/HDL Ratio 2    NonHDL 128.00   Comprehensive metabolic panel  Result Value Ref Range   Sodium 138 135 - 145 mEq/L   Potassium 4.4 3.5 - 5.1 mEq/L     Chloride 102 96 - 112 mEq/L   CO2 29 19 - 32 mEq/L   Glucose, Bld 82 70 - 99 mg/dL   BUN 23 6 - 23 mg/dL   Creatinine, Ser 0.92 0.40 - 1.20 mg/dL   Total Bilirubin 0.4 0.2 - 1.2 mg/dL   Alkaline Phosphatase 109 39 - 117 U/L   AST 34 0 - 37 U/L   ALT 34 0 - 35 U/L   Total Protein 7.9 6.0 - 8.3 g/dL   Albumin 4.4 3.5 - 5.2 g/dL   Calcium 10.1 8.4 - 10.5 mg/dL   GFR 64.78 >60.00 mL/min  TSH  Result Value Ref Range   TSH 1.55 0.35 - 4.50 uIU/mL  CBC with Differential/Platelet  Result Value Ref Range   WBC 7.7 4.0 - 10.5 K/uL   RBC 4.45 3.87 - 5.11 Mil/uL   Hemoglobin 13.8 12.0 - 15.0 g/dL   HCT 40.3 36.0 - 46.0 %   MCV 90.4 78.0 - 100.0 fl   MCHC 34.2 30.0 - 36.0 g/dL   RDW 13.5 11.5 - 15.5 %   Platelets 283.0 150.0 - 400.0 K/uL   Neutrophils Relative % 64.6 43.0 - 77.0 %   Lymphocytes Relative 27.5 12.0 - 46.0 %   Monocytes Relative 6.4 3.0 - 12.0 %   Eosinophils Relative 1.0 0.0 - 5.0 %   Basophils Relative 0.5 0.0 - 3.0 %   Neutro Abs 5.0 1.4 - 7.7 K/uL   Lymphs Abs 2.1 0.7 - 4.0 K/uL   Monocytes Absolute 0.5 0.1 - 1.0 K/uL   Eosinophils Absolute 0.1 0.0 - 0.7 K/uL   Basophils Absolute 0.0 0.0 - 0.1 K/uL      Assessment & Plan:   Problem List Items Addressed This Visit    Syncope - Primary    Prodromal syncope. ?vasovagal after plane ride and possible dehydration. Now 2nd episode in last few years, will complete syncopal workup. Reviewed recent stable echo 10/2014, as well as stable labwork from 01/2015. Check carotid US as well as head CT today. If unrevealing, will recommend return to Dr Joylene Grapes.  Also discussed importance of staying well hydrated.  Pt agrees with plan.  EKG today - NSR rate 60, normal axis, intervals, no hypertrophy or acute ST/T changes, good R wave progression.      Relevant Orders   EKG 12-Lead (Completed)   CT Head Wo Contrast   Carotid       Follow up plan: Return if symptoms worsen or fail to improve.

## 2015-03-04 NOTE — Assessment & Plan Note (Addendum)
Prodromal syncope. ?vasovagal after plane ride and possible dehydration. Now 2nd episode in last few years, will complete syncopal workup. Reviewed recent stable echo 10/2014, as well as stable labwork from 01/2015. Check carotid US as well as head CT today. If unrevealing, will recommend return to Dr Joylene Grapes.  Also discussed importance of staying well hydrated.  Pt agrees with plan.  EKG today - NSR rate 60, normal axis, intervals, no hypertrophy or acute ST/T changes, good R wave progression.

## 2015-03-04 NOTE — Assessment & Plan Note (Signed)
Overall stable. No changes made.

## 2015-03-04 NOTE — Patient Instructions (Signed)
Second episode of passing out - let's check ultrasound of neck arteries and CT scan of brain. If normal, we will have you return to see Dr Rayann Heman sooner for recheck heart. Overall exam normal today otherwise - make sure you are staying well hydrated.

## 2015-03-04 NOTE — Progress Notes (Signed)
Pre visit review using our clinic review tool, if applicable. No additional management support is needed unless otherwise documented below in the visit note. 

## 2015-03-16 ENCOUNTER — Ambulatory Visit (INDEPENDENT_AMBULATORY_CARE_PROVIDER_SITE_OTHER)
Admission: RE | Admit: 2015-03-16 | Discharge: 2015-03-16 | Disposition: A | Discharge status: Home / Self Care | Payer: Medicare Other | Source: Ambulatory Visit | Attending: Family Medicine | Admitting: Family Medicine

## 2015-03-16 DIAGNOSIS — R55 Syncope and collapse: Secondary | ICD-10-CM | POA: Diagnosis not present

## 2015-03-17 ENCOUNTER — Ambulatory Visit
Admission: RE | Admit: 2015-03-17 | Discharge: 2015-03-17 | Disposition: A | Discharge status: Home / Self Care | Payer: Medicare Other | Source: Ambulatory Visit | Attending: Family Medicine | Admitting: Family Medicine

## 2015-03-17 ENCOUNTER — Ambulatory Visit (HOSPITAL_COMMUNITY): Payer: Medicare Other | Attending: Cardiology

## 2015-03-17 DIAGNOSIS — I6523 Occlusion and stenosis of bilateral carotid arteries: Secondary | ICD-10-CM | POA: Insufficient documentation

## 2015-03-17 DIAGNOSIS — M810 Age-related osteoporosis without current pathological fracture: Secondary | ICD-10-CM | POA: Insufficient documentation

## 2015-03-17 DIAGNOSIS — R55 Syncope and collapse: Secondary | ICD-10-CM | POA: Diagnosis not present

## 2015-03-17 DIAGNOSIS — M8580 Other specified disorders of bone density and structure, unspecified site: Secondary | ICD-10-CM | POA: Diagnosis present

## 2015-03-24 ENCOUNTER — Encounter: Payer: Self-pay | Admitting: Family Medicine

## 2015-04-08 ENCOUNTER — Other Ambulatory Visit: Payer: Self-pay | Admitting: *Deleted

## 2015-04-08 ENCOUNTER — Ambulatory Visit (INDEPENDENT_AMBULATORY_CARE_PROVIDER_SITE_OTHER): Payer: Medicare Other | Admitting: Nurse Practitioner

## 2015-04-08 ENCOUNTER — Encounter: Payer: Self-pay | Admitting: Nurse Practitioner

## 2015-04-08 VITALS — BP 112/60 | HR 67 | Ht 65.0 in | Wt 134.2 lb

## 2015-04-08 DIAGNOSIS — I480 Paroxysmal atrial fibrillation: Secondary | ICD-10-CM | POA: Diagnosis not present

## 2015-04-08 DIAGNOSIS — T671XXD Heat syncope, subsequent encounter: Secondary | ICD-10-CM | POA: Diagnosis not present

## 2015-04-08 NOTE — Progress Notes (Signed)
Electrophysiology Office Note Date: 04/08/2015  ID:  Alexis Bentley, Alexis Bentley [birth year], MRN [id number]  PCP: Ria Bush, MD Electrophysiologist: Rayann Heman  CC: syncope  Alexis Bentley is a 69 y.o. female seen today for Dr Rayann Heman.  She has a history of palpitations that have been identifeid as AF, PAC's, and PVC's.  She recently took a trip to [place] and had a syncopal episode while in a restaurant.  She started feeling nauseated and diaphoretic, got up to go to the bathroom and passed out.  She quickly regained consciousness but had residual nausea and vomiting when she stood up. EMS was called and noted no abnormality with HR or BP.  She has had one prior syncopal episode about a year ago when she was standing and associated with the same prodrome.  She was seen by her PCP and work up was completed including carotid dopplers, head CT, and EKG all of which were negative.  She presents today for electrophysiology followup.  She denies chest pain, palpitations, dyspnea, PND, orthopnea, nausea, vomiting, dizziness, syncope, edema, weight gain, or early satiety.  Past Medical History  Diagnosis Date  . Paroxysmal atrial fibrillation 2010    one episode  . Hypertension   . Chest pain   . Lumbar back pain     With radiculopathy  . Hemorrhoids   . Hiatal hernia   . Erosive gastritis   . Rectal prolapse   . Peptic ulcer disease   . GERD (gastroesophageal reflux disease)   . Non-ST elevation myocardial infarction (NSTEMI) 2010    likely type 2 - demand ischemia during afib  . Transaminitis 2015    normal iron, viral hep panel, stable abd [place] (benign gallbladder polyp)  . Osteoporosis 01/07/2015    T-2.5 hip  . Syncope 03/2015    stable head CT, echo, carotids, labwork, EKG   Past Surgical History  Procedure Laterality Date  . Partial hysterectomy  1981    Bleeding, cyst, bladder tack  . Rectal prolapse repair  2005  . Colonoscopy  12/03/2006    Internal/ external hemorrhoids, no polyps    . Esophagogastroduodenoscopy  04/27/2007    Erosive and ulcerative gastritis; small H.H.  . Coronary angioplasty  08/07/2009    NML  . Total abdominal hysterectomy      With a bilateral salpingo- oophorectomy  . Dexa  03/2015    T -2.5 hip    Current Outpatient Prescriptions  Medication Sig Dispense Refill  . apixaban (ELIQUIS) 5 MG TABS tablet Take 1 tablet (5 mg total) by mouth 2 (two) times daily. 60 tablet 11  . Calcium Carbonate-Vitamin D (CALTRATE 600+D) 600-400 MG-UNIT per tablet Take 2 tablets by mouth daily.      Marland Kitchen lisinopril (PRINIVIL,ZESTRIL) 20 MG tablet Take 1 tablet (20 mg total) by mouth daily. 30 tablet 11  . metoprolol (LOPRESSOR) 50 MG tablet Take 0.5 tablets (25 mg total) by mouth 2 (two) times daily. 60 tablet 6  . omeprazole (PRILOSEC) 40 MG capsule TAKE 1 CAPSULE BY MOUTH EVERY DAY 90 capsule 3  . hydrochlorothiazide (MICROZIDE) 12.5 MG capsule As needed for swelling (Patient not taking: Reported on 10/06/2014) 30 capsule 6   No current facility-administered medications for this visit.    Allergies:   Review of patient's allergies indicates no known allergies.   Social History: History   Social History  . Marital Status: Married    Spouse Name: N/A  . Number of Children: N/A  . Years of  Education: N/A   Occupational History  .  [employer]   Social History Main Topics  . Smoking status: Never Smoker   . Smokeless tobacco: Never Used  . Alcohol Use: No  . Drug Use: No  . Sexual Activity: Not on file   Other Topics Concern  . Not on file   Social History Narrative   Married   Lives with husband.  2 grown children   Daily caffeine use   Retired 2013 was [employer]   Activity: walking   Diet: good water intake, fruits/vegetables daily    Family History: Family History  Problem Relation Age of Onset  . Hypertension Mother   . COPD Mother     Prior smoker  . Coronary artery disease Mother   . Other Mother     HBP  .  Heart attack Father     MI, x4 stents, Pacer  . Hypertension Father   . Stroke Father     [place]  . Coronary artery disease Father   . Other Father     HBP  . Colon cancer Father   . Testicular cancer Brother   . Prostate cancer Brother   . Diabetes type II Son     Review of Systems: All other systems reviewed and are otherwise negative except as noted above.   Physical Exam: VS:  BP 112/60 mmHg  Pulse 67  Ht 5\' 5"  (1.651 m)  Wt 134 lb 3.2 oz (60.873 kg)  BMI 22.33 kg/m2 , BMI Body mass index is 22.33 kg/(m^2). Wt Readings from Last 3 Encounters:  04/08/15 134 lb 3.2 oz (60.873 kg)  03/04/15 129 lb 8 oz (58.741 kg)  01/07/15 130 lb 12 oz (59.308 kg)    GEN- The patient is well appearing, alert and oriented x 3 today.   HEENT: normocephalic, atraumatic; sclera clear, conjunctiva pink; hearing intact; oropharynx clear; neck supple Lungs- Clear to ausculation bilaterally, normal work of breathing.  No wheezes, rales, rhonchi Heart- Regular rate and rhythm, no murmurs, rubs or gallops GI- soft, non-tender, non-distended, bowel sounds present Extremities- no clubbing, cyanosis, or edema; DP/PT/radial pulses 2+ bilaterally MS- no significant deformity or atrophy Skin- warm and dry, no rash or lesion  Psych- euthymic mood, full affect Neuro- strength and sensation are intact   EKG:  EKG is not ordered today. The ekg from 03/04/15 is reviewed and normal  Recent Labs: 01/07/2015: ALT 34; BUN 23; Creatinine, Ser 0.92; Hemoglobin 13.8; Platelets 283.0; Potassium 4.4; Sodium 138; TSH 1.55    Other studies Reviewed: Additional studies/ records that were reviewed today include: Dr Jackalyn Lombard office notes, recent testing  Assessment and Plan: 1.  Syncope The patient had a syncopal episode that was likely vasovagal in origin Work up including carotid dopplers, EKG were negative Recommend adequate hydration, avoidance of triggers  2.  Paroxysmal atrial fibrillation Continue  Eliquis for CHADS2VASC of at least 3 Maintaining SR by symptoms She does have occasional palpitations, but these are stable   3.  HTN Stable No change required today  Current medicines are reviewed at length with the patient today.   The patient does not have concerns regarding her medicines.  The following changes were made today:  none  Labs/ tests ordered today include: none  Disposition:   Follow up with Dr Rayann Heman in 6 months as scheduled   Signed, Chanetta Marshall, NP 04/08/2015 3:37 PM   [facility] [address] [place] [address] [phone] (office) (  [phone] (fax)

## 2015-05-19 ENCOUNTER — Encounter: Payer: Self-pay | Admitting: Family Medicine

## 2015-05-19 ENCOUNTER — Ambulatory Visit (INDEPENDENT_AMBULATORY_CARE_PROVIDER_SITE_OTHER): Payer: Medicare Other | Admitting: Family Medicine

## 2015-05-19 VITALS — BP 138/78 | HR 66 | Temp 97.6°F | Ht 64.5 in | Wt 133.5 lb

## 2015-05-19 DIAGNOSIS — R7401 Elevation of levels of liver transaminase levels: Secondary | ICD-10-CM

## 2015-05-19 DIAGNOSIS — Z0000 Encounter for general adult medical examination without abnormal findings: Secondary | ICD-10-CM | POA: Diagnosis not present

## 2015-05-19 DIAGNOSIS — K219 Gastro-esophageal reflux disease without esophagitis: Secondary | ICD-10-CM | POA: Diagnosis not present

## 2015-05-19 DIAGNOSIS — Z7189 Other specified counseling: Secondary | ICD-10-CM | POA: Insufficient documentation

## 2015-05-19 DIAGNOSIS — I480 Paroxysmal atrial fibrillation: Secondary | ICD-10-CM

## 2015-05-19 DIAGNOSIS — E785 Hyperlipidemia, unspecified: Secondary | ICD-10-CM | POA: Diagnosis not present

## 2015-05-19 DIAGNOSIS — Z23 Encounter for immunization: Secondary | ICD-10-CM

## 2015-05-19 DIAGNOSIS — R740 Nonspecific elevation of levels of transaminase and lactic acid dehydrogenase [LDH]: Secondary | ICD-10-CM

## 2015-05-19 DIAGNOSIS — I10 Essential (primary) hypertension: Secondary | ICD-10-CM

## 2015-05-19 DIAGNOSIS — M810 Age-related osteoporosis without current pathological fracture: Secondary | ICD-10-CM | POA: Diagnosis not present

## 2015-05-19 LAB — LIPID PANEL
Cholesterol: 189 mg/dL (ref 0–200)
HDL: 70.6 mg/dL (ref >39.00)
LDL Cholesterol: 91 mg/dL (ref 0–99)
NONHDL: 118.89
Total CHOL/HDL Ratio: 3
Triglycerides: 141 mg/dL (ref 0.0–149.0)
VLDL: 28.2 mg/dL (ref 0.0–40.0)

## 2015-05-19 LAB — VITAMIN B12: VITAMIN B 12: 267 pg/mL (ref 211–911)

## 2015-05-19 LAB — VITAMIN D 25 HYDROXY (VIT D DEFICIENCY, FRACTURES): VITD: 69.65 ng/mL (ref 30.00–100.00)

## 2015-05-19 NOTE — Progress Notes (Signed)
Pre visit review using our clinic review tool, if applicable. No additional management support is needed unless otherwise documented below in the visit note. 

## 2015-05-19 NOTE — Patient Instructions (Addendum)
Think about bisphosphonates. Information provided. Let me know if you decide to start. Send me copy of mammogram. prevnar today. Call your insurance about the shingles shot to see if it is covered or how much it would cost and where is cheaper (here or pharmacy).  If you want to receive here, call for nurse visit.  Advanced directive packet provided  Health Maintenance Adopting a healthy lifestyle and getting preventive care can go a long way to promote health and wellness. Talk with your health care provider about what schedule of regular examinations is right for you. This is a good chance for you to check in with your provider about disease prevention and staying healthy. In between checkups, there are plenty of things you can do on your own. Experts have done a lot of research about which lifestyle changes and preventive measures are most likely to keep you healthy. Ask your health care provider for more information. WEIGHT AND DIET  Eat a healthy diet  Be sure to include plenty of vegetables, fruits, low-fat dairy products, and lean protein.  Do not eat a lot of foods high in solid fats, added sugars, or salt.  Get regular exercise. This is one of the most important things you can do for your health.  Most adults should exercise for at least 150 minutes each week. The exercise should increase your heart rate and make you sweat (moderate-intensity exercise).  Most adults should also do strengthening exercises at least twice a week. This is in addition to the moderate-intensity exercise.  Maintain a healthy weight  Body mass index (BMI) is a measurement that can be used to identify possible weight problems. It estimates body fat based on height and weight. Your health care provider can help determine your BMI and help you achieve or maintain a healthy weight.  For females 19 years of age and older:   A BMI below 18.5 is considered underweight.  A BMI of 18.5 to 24.9 is normal.  A  BMI of 25 to 29.9 is considered overweight.  A BMI of 30 and above is considered obese.  Watch levels of cholesterol and blood lipids  You should start having your blood tested for lipids and cholesterol at 67 years of age, then have this test every 5 years.  You may need to have your cholesterol levels checked more often if:  Your lipid or cholesterol levels are high.  You are older than 67 years of age.  You are at high risk for heart disease.  CANCER SCREENING   Lung Cancer  Lung cancer screening is recommended for adults 29-40 years old who are at high risk for lung cancer because of a history of smoking.  A yearly low-dose CT scan of the lungs is recommended for people who:  Currently smoke.  Have quit within the past 15 years.  Have at least a 30-pack-year history of smoking. A pack year is smoking an average of one pack of cigarettes a day for 1 year.  Yearly screening should continue until it has been 15 years since you quit.  Yearly screening should stop if you develop a health problem that would prevent you from having lung cancer treatment.  Breast Cancer  Practice breast self-awareness. This means understanding how your breasts normally appear and feel.  It also means doing regular breast self-exams. Let your health care provider know about any changes, no matter how small.  If you are in your 20s or 30s, you should have a  clinical breast exam (CBE) by a health care provider every 1-3 years as part of a regular health exam.  If you are 10 or older, have a CBE every year. Also consider having a breast X-ray (mammogram) every year.  If you have a family history of breast cancer, talk to your health care provider about genetic screening.  If you are at high risk for breast cancer, talk to your health care provider about having an MRI and a mammogram every year.  Breast cancer gene (BRCA) assessment is recommended for women who have family members with  BRCA-related cancers. BRCA-related cancers include:  Breast.  Ovarian.  Tubal.  Peritoneal cancers.  Results of the assessment will determine the need for genetic counseling and BRCA1 and BRCA2 testing. Cervical Cancer Routine pelvic examinations to screen for cervical cancer are no longer recommended for nonpregnant women who are considered low risk for cancer of the pelvic organs (ovaries, uterus, and vagina) and who do not have symptoms. A pelvic examination may be necessary if you have symptoms including those associated with pelvic infections. Ask your health care provider if a screening pelvic exam is right for you.   The Pap test is the screening test for cervical cancer for women who are considered at risk.  If you had a hysterectomy for a problem that was not cancer or a condition that could lead to cancer, then you no longer need Pap tests.  If you are older than 65 years, and you have had normal Pap tests for the past 10 years, you no longer need to have Pap tests.  If you have had past treatment for cervical cancer or a condition that could lead to cancer, you need Pap tests and screening for cancer for at least 20 years after your treatment.  If you no longer get a Pap test, assess your risk factors if they change (such as having a new sexual partner). This can affect whether you should start being screened again.  Some women have medical problems that increase their chance of getting cervical cancer. If this is the case for you, your health care provider may recommend more frequent screening and Pap tests.  The human papillomavirus (HPV) test is another test that may be used for cervical cancer screening. The HPV test looks for the virus that can cause cell changes in the cervix. The cells collected during the Pap test can be tested for HPV.  The HPV test can be used to screen women 11 years of age and older. Getting tested for HPV can extend the interval between normal Pap  tests from three to five years.  An HPV test also should be used to screen women of any age who have unclear Pap test results.  After 67 years of age, women should have HPV testing as often as Pap tests.  Colorectal Cancer  This type of cancer can be detected and often prevented.  Routine colorectal cancer screening usually begins at 67 years of age and continues through 67 years of age.  Your health care provider may recommend screening at an earlier age if you have risk factors for colon cancer.  Your health care provider may also recommend using home test kits to check for hidden blood in the stool.  A small camera at the end of a tube can be used to examine your colon directly (sigmoidoscopy or colonoscopy). This is done to check for the earliest forms of colorectal cancer.  Routine screening usually begins at  age 56.  Direct examination of the colon should be repeated every 5-10 years through 67 years of age. However, you may need to be screened more often if early forms of precancerous polyps or small growths are found. Skin Cancer  Check your skin from head to toe regularly.  Tell your health care provider about any new moles or changes in moles, especially if there is a change in a mole's shape or color.  Also tell your health care provider if you have a mole that is larger than the size of a pencil eraser.  Always use sunscreen. Apply sunscreen liberally and repeatedly throughout the day.  Protect yourself by wearing long sleeves, pants, a wide-brimmed hat, and sunglasses whenever you are outside. HEART DISEASE, DIABETES, AND HIGH BLOOD PRESSURE   Have your blood pressure checked at least every 1-2 years. High blood pressure causes heart disease and increases the risk of stroke.  If you are between 58 years and 16 years old, ask your health care provider if you should take aspirin to prevent strokes.  Have regular diabetes screenings. This involves taking a blood sample  to check your fasting blood sugar level.  If you are at a normal weight and have a low risk for diabetes, have this test once every three years after 67 years of age.  If you are overweight and have a high risk for diabetes, consider being tested at a younger age or more often. PREVENTING INFECTION  Hepatitis B  If you have a higher risk for hepatitis B, you should be screened for this virus. You are considered at high risk for hepatitis B if:  You were born in a country where hepatitis B is common. Ask your health care provider which countries are considered high risk.  Your parents were born in a high-risk country, and you have not been immunized against hepatitis B (hepatitis B vaccine).  You have HIV or AIDS.  You use needles to inject street drugs.  You live with someone who has hepatitis B.  You have had sex with someone who has hepatitis B.  You get hemodialysis treatment.  You take certain medicines for conditions, including cancer, organ transplantation, and autoimmune conditions. Hepatitis C  Blood testing is recommended for:  Everyone born from [birth year] through 1963.  Anyone with known risk factors for hepatitis C. Sexually transmitted infections (STIs)  You should be screened for sexually transmitted infections (STIs) including gonorrhea and chlamydia if:  You are sexually active and are younger than 67 years of age.  You are older than 67 years of age and your health care provider tells you that you are at risk for this type of infection.  Your sexual activity has changed since you were last screened and you are at an increased risk for chlamydia or gonorrhea. Ask your health care provider if you are at risk.  If you do not have HIV, but are at risk, it may be recommended that you take a prescription medicine daily to prevent HIV infection. This is called pre-exposure prophylaxis (PrEP). You are considered at risk if:  You are sexually active and do not regularly  use condoms or know the HIV status of your partner(s).  You take drugs by injection.  You are sexually active with a partner who has HIV. Talk with your health care provider about whether you are at high risk of being infected with HIV. If you choose to begin PrEP, you should first be tested for HIV. You  should then be tested every 3 months for as long as you are taking PrEP.  PREGNANCY   If you are premenopausal and you may become pregnant, ask your health care provider about preconception counseling.  If you may become pregnant, take 400 to 800 micrograms (mcg) of folic acid every day.  If you want to prevent pregnancy, talk to your health care provider about birth control (contraception). OSTEOPOROSIS AND MENOPAUSE   Osteoporosis is a disease in which the bones lose minerals and strength with aging. This can result in serious bone fractures. Your risk for osteoporosis can be identified using a bone density scan.  If you are 72 years of age or older, or if you are at risk for osteoporosis and fractures, ask your health care provider if you should be screened.  Ask your health care provider whether you should take a calcium or vitamin D supplement to lower your risk for osteoporosis.  Menopause may have certain physical symptoms and risks.  Hormone replacement therapy may reduce some of these symptoms and risks. Talk to your health care provider about whether hormone replacement therapy is right for you.  HOME CARE INSTRUCTIONS   Schedule regular health, dental, and eye exams.  Stay current with your immunizations.   Do not use any tobacco products including cigarettes, chewing tobacco, or electronic cigarettes.  If you are pregnant, do not drink alcohol.  If you are breastfeeding, limit how much and how often you drink alcohol.  Limit alcohol intake to no more than 1 drink per day for nonpregnant women. One drink equals 12 ounces of beer, 5 ounces of wine, or 1 ounces of hard  liquor.  Do not use street drugs.  Do not share needles.  Ask your health care provider for help if you need support or information about quitting drugs.  Tell your health care provider if you often feel depressed.  Tell your health care provider if you have ever been abused or do not feel safe at home. Document Released: 04/04/2011 Document Revised: 02/03/2014 Document Reviewed: 08/21/2013 [facility] Patient Information 2015 [place], [place]. This information is not intended to replace advice given to you by your health care provider. Make sure you discuss any questions you have with your health care provider. Alendronate tablets What is this medicine? ALENDRONATE (a LEN droe nate) slows calcium loss from bones. It helps to make normal healthy bone and to slow bone loss in people with Paget's disease and osteoporosis. It may be used in others at risk for bone loss. This medicine may be used for other purposes; ask your health care provider or pharmacist if you have questions. COMMON BRAND NAME(S): Fosamax What should I tell my health care provider before I take this medicine? They need to know if you have any of these conditions: -dental disease -esophagus, stomach, or intestine problems, like acid reflux or GERD -kidney disease -low blood calcium -low vitamin D -problems sitting or standing 30 minutes -trouble swallowing -an unusual or allergic reaction to alendronate, other medicines, foods, dyes, or preservatives -pregnant or trying to get pregnant -breast-feeding How should I use this medicine? You must take this medicine exactly as directed or you will lower the amount of the medicine you absorb into your body or you may cause yourself harm. Take this medicine by mouth first thing in the morning, after you are up for the day. Do not eat or drink anything before you take your medicine. Swallow the tablet with a full glass (6 to  8 fluid ounces) of plain water. Do not take this  medicine with any other drink. Do not chew or crush the tablet. After taking this medicine, do not eat breakfast, drink, or take any medicines or vitamins for at least 30 minutes. Sit or stand up for at least 30 minutes after you take this medicine; do not lie down. Do not take your medicine more often than directed. Talk to your pediatrician regarding the use of this medicine in children. Special care may be needed. Overdosage: If you think you have taken too much of this medicine contact a poison control center or emergency room at once. NOTE: This medicine is only for you. Do not share this medicine with others. What if I miss a dose? If you miss a dose, do not take it later in the day. Continue your normal schedule starting the next morning. Do not take double or extra doses. What may interact with this medicine? -aluminum hydroxide -antacids -aspirin -calcium supplements -drugs for inflammation like ibuprofen, naproxen, and others -iron supplements -magnesium supplements -vitamins with minerals This list may not describe all possible interactions. Give your health care provider a list of all the medicines, herbs, non-prescription drugs, or dietary supplements you use. Also tell them if you smoke, drink alcohol, or use illegal drugs. Some items may interact with your medicine. What should I watch for while using this medicine? Visit your doctor or health care professional for regular checks ups. It may be some time before you see benefit from this medicine. Do not stop taking your medicine except on your doctor's advice. Your doctor or health care professional may order blood tests and other tests to see how you are doing. You should make sure you get enough calcium and vitamin D while you are taking this medicine, unless your doctor tells you not to. Discuss the foods you eat and the vitamins you take with your health care professional. Some people who take this medicine have severe bone,  joint, and/or muscle pain. This medicine may also increase your risk for a broken thigh bone. Tell your doctor right away if you have pain in your upper leg or groin. Tell your doctor if you have any pain that does not go away or that gets worse. This medicine can make you more sensitive to the sun. If you get a rash while taking this medicine, sunlight may cause the rash to get worse. Keep out of the sun. If you cannot avoid being in the sun, wear protective clothing and use sunscreen. Do not use sun lamps or tanning beds/booths. What side effects may I notice from receiving this medicine? Side effects that you should report to your doctor or health care professional as soon as possible: -allergic reactions like skin rash, itching or hives, swelling of the face, lips, or tongue -black or tarry stools -bone, muscle or joint pain -changes in vision -chest pain -heartburn or stomach pain -jaw pain, especially after dental work -pain or trouble when swallowing -redness, blistering, peeling or loosening of the skin, including inside the mouth Side effects that usually do not require medical attention (report to your doctor or health care professional if they continue or are bothersome): -changes in taste -diarrhea or constipation -eye pain or itching -headache -nausea or vomiting -stomach gas or fullness This list may not describe all possible side effects. Call your doctor for medical advice about side effects. You may report side effects to FDA at 1-800-FDA-1088. Where should I keep my medicine?  Keep out of the reach of children. Store at room temperature of 15 and 30 degrees C (59 and 86 degrees F). Throw away any unused medicine after the expiration date. NOTE: This sheet is a summary. It may not cover all possible information. If you have questions about this medicine, talk to your doctor, pharmacist, or health care provider.  2015, [employer]. (2011-03-18 08:56:09)

## 2015-05-19 NOTE — Assessment & Plan Note (Signed)
Discussed options. rec continue cal/vit D Discussed bisphosphonates. Pt will consider, check with dentist at upcoming appt. Pt information handout provided on fosamax

## 2015-05-19 NOTE — Assessment & Plan Note (Signed)
Continue eliquis. Maintaining sinus rhythm.

## 2015-05-19 NOTE — Addendum Note (Signed)
Addended by: Emelia Salisbury C on: 05/19/2015 10:03 AM   Modules accepted: Orders

## 2015-05-19 NOTE — Assessment & Plan Note (Addendum)
Discussed longterm omeprazole use. H/o PUD, erosive gastritis and HH.  Check vit D level and b12 today.

## 2015-05-19 NOTE — Progress Notes (Signed)
BP 138/78 mmHg  Pulse 66  Temp(Src) 97.6 F (36.4 C) (Oral)  Ht 5' 4.5" (1.638 m)  Wt 133 lb 8 oz (60.555 kg)  BMI 22.57 kg/m2   CC: medicare wellness visit  Subjective:    Patient ID: Alexis Bentley, female    DOB: 1946/02/13, 69 y.o.   MRN: [id number]  HPI: [name] is a 69 y.o. female presenting on 05/19/2015 for Annual Exam   GERD - on omeprazole 40mg  daily longterm.   Osteoporosis with T score -2.5. S/p total hysterectomy 2008. Did use hormone replacement therapy for 4-5 years after menopause. No previous fracture. no smoking.  H/o parox afib 2010, no recurrence. On eliquis for CHADSVASC2 of at least 3.  H/o syncope s/p cards eval - thought vasovagal, s/p normal carotid dopplers, EKG, CT head. H/o NSTEMI - pt denies this - will review records ==> NSTEMI with elevated troponins in setting of HR 170s and anticipated demand ischemia, cath 2010 WNL. Off statin since.  Passes hearing and vision screens today Denies falls, depression,anhedonia.  Preventative: COLONOSCOPY Date: 12/03/2006 Internal/ external hemorrhoids, no polyps [name]) Well woman exam - OBGYN Dr. Vernie Amm[facility]), 2015. Mammogram ~04/2014. DEXA Date: 03/2015 T -2.5 hip - osteoporosis. Takes cal/vit D daily. Flu 2015 Tetanus 2009. Prevnar today Shingles shot - has not had. May be interested. Will look into insurance. Advanced directive - does not have. Packet provided today. HCPOA unsure.  Seat belt use discussed Sunscreen use discussed  Married Lives with husband. 2 grown children Daily caffeine use Retired 2013 was [place] Activity: walking Diet: good water intake, fruits/vegetables daily  Relevant past medical, surgical, family and social history reviewed and updated as indicated. Interim medical history since our last visit reviewed. Allergies and medications reviewed and updated. Current Outpatient Prescriptions on File Prior to Visit  Medication Sig  .  apixaban (ELIQUIS) 5 MG TABS tablet Take 1 tablet (5 mg total) by mouth 2 (two) times daily.  . Calcium Carbonate-Vitamin D (CALTRATE 600+D) 600-400 MG-UNIT per tablet Take 1 tablet by mouth 2 (two) times daily.   Marland Kitchen lisinopril (PRINIVIL,ZESTRIL) 20 MG tablet Take 1 tablet (20 mg total) by mouth daily.  . metoprolol (LOPRESSOR) 50 MG tablet Take 0.5 tablets (25 mg total) by mouth 2 (two) times daily.  Marland Kitchen omeprazole (PRILOSEC) 40 MG capsule TAKE 1 CAPSULE BY MOUTH EVERY DAY  . hydrochlorothiazide (MICROZIDE) 12.5 MG capsule As needed for swelling (Patient not taking: Reported on 10/06/2014)   No current facility-administered medications on file prior to visit.    Review of Systems Per HPI unless specifically indicated above     Objective:    BP 138/78 mmHg  Pulse 66  Temp(Src) 97.6 F (36.4 C) (Oral)  Ht 5' 4.5" (1.638 m)  Wt 133 lb 8 oz (60.555 kg)  BMI 22.57 kg/m2  Wt Readings from Last 3 Encounters:  05/19/15 133 lb 8 oz (60.555 kg)  04/08/15 134 lb 3.2 oz (60.873 kg)  03/04/15 129 lb 8 oz (58.741 kg)    Physical Exam  Constitutional: She is oriented to person, place, and time. She appears well-developed and well-nourished. No distress.  HENT:  Head: Normocephalic and atraumatic.  Right Ear: Hearing, tympanic membrane, external ear and ear canal normal.  Left Ear: Hearing, tympanic membrane, external ear and ear canal normal.  Nose: Nose normal.  Mouth/Throat: Uvula is midline, oropharynx is clear and moist and mucous membranes are normal. No oropharyngeal exudate, posterior oropharyngeal edema or posterior oropharyngeal  erythema.  Eyes: Conjunctivae and EOM are normal. Pupils are equal, round, and reactive to light. No scleral icterus.  Neck: Normal range of motion. Neck supple. Carotid bruit is not present. No thyromegaly present.  Cardiovascular: Normal rate, regular rhythm, normal heart sounds and intact distal pulses.   No murmur heard. Pulses:      Radial pulses are 2+ on  the right side, and 2+ on the left side.  regular  Pulmonary/Chest: Effort normal and breath sounds normal. No respiratory distress. She has no wheezes. She has no rales.  Abdominal: Soft. Bowel sounds are normal. She exhibits no distension and no mass. There is no tenderness. There is no rebound and no guarding.  Musculoskeletal: Normal range of motion. She exhibits no edema.  Lymphadenopathy:    She has no cervical adenopathy.  Neurological: She is alert and oriented to person, place, and time.  CN grossly intact, station and gait intact Recall 3/3 Calculation 5/5 serial 3s  Skin: Skin is warm and dry. No rash noted.  Psychiatric: She has a normal mood and affect. Her behavior is normal. Judgment and thought content normal.  Nursing note and vitals reviewed.  Results for orders placed or performed in visit on 05/19/15  HM MAMMOGRAPHY  Result Value Ref Range   HM Mammogram normal       Assessment & Plan:   Problem List Items Addressed This Visit    Essential hypertension    Chronic, stable. Continue current regimen.      Paroxysmal atrial fibrillation    Continue eliquis. Maintaining sinus rhythm.      GERD    Discussed longterm omeprazole use. H/o PUD, erosive gastritis and HH.  Check vit D level and b12 today.      Relevant Orders   Vitamin B12   Vit D  25 hydroxy (rtn osteoporosis monitoring)   Transaminitis    Normal LFTs last check.      Osteoporosis    Discussed options. rec continue cal/vit D Discussed bisphosphonates. Pt will consider, check with dentist at upcoming appt. Pt information handout provided on fosamax      Relevant Orders   Vit D  25 hydroxy (rtn osteoporosis monitoring)   Medicare annual wellness visit, initial - Primary    I have personally reviewed the Medicare Annual Wellness questionnaire and have noted 1. The patient's medical and social history 2. Their use of alcohol, tobacco or illicit drugs 3. Their current medications and  supplements 4. The patient's functional ability including ADL's, fall risks, home safety risks and hearing or visual impairment. Cognitive function has been assessed and addressed as indicated.  5. Diet and physical activity 6. Evidence for depression or mood disorders The patients weight, height, BMI have been recorded in the chart. I have made referrals, counseling and provided education to the patient based on review of the above and I have provided the pt with a written personalized care plan for preventive services. Provider list updated.. See scanned questionairre as needed for further documentation. Reviewed preventative protocols and updated unless pt declined.       Advanced care planning/counseling discussion    Advanced directive - does not have. Packet provided today. HCPOA unsure.       HLD (hyperlipidemia)   Relevant Orders   Lipid panel       Follow up plan: Return in about 6 months (around 11/19/2015), or as needed, for follow up visit.

## 2015-05-19 NOTE — Assessment & Plan Note (Signed)
Normal LFTs last check.

## 2015-05-19 NOTE — Assessment & Plan Note (Signed)

## 2015-05-19 NOTE — Assessment & Plan Note (Signed)
Chronic, stable. Continue current regimen. 

## 2015-05-19 NOTE — Assessment & Plan Note (Signed)
Advanced directive - does not have. Packet provided today. HCPOA unsure.

## 2015-05-20 ENCOUNTER — Other Ambulatory Visit: Payer: Self-pay | Admitting: Family Medicine

## 2015-05-20 ENCOUNTER — Encounter: Payer: Self-pay | Admitting: *Deleted

## 2015-05-20 DIAGNOSIS — E538 Deficiency of other specified B group vitamins: Secondary | ICD-10-CM | POA: Insufficient documentation

## 2015-05-20 MED ORDER — VITAMIN B-12 500 MCG PO TABS: 500.0000 ug | ORAL_TABLET | Freq: Every day | ORAL | Status: DC

## 2015-07-01 DIAGNOSIS — Z1231 Encounter for screening mammogram for malignant neoplasm of breast: Secondary | ICD-10-CM | POA: Diagnosis not present

## 2015-07-02 ENCOUNTER — Encounter: Payer: Self-pay | Admitting: Family Medicine

## 2015-07-02 ENCOUNTER — Ambulatory Visit (INDEPENDENT_AMBULATORY_CARE_PROVIDER_SITE_OTHER): Payer: Medicare Other | Admitting: Family Medicine

## 2015-07-02 VITALS — BP 122/78 | HR 68 | Temp 98.2°F | Wt 129.0 lb

## 2015-07-02 DIAGNOSIS — L200 Besnier's prurigo: Secondary | ICD-10-CM

## 2015-07-02 DIAGNOSIS — L239 Allergic contact dermatitis, unspecified cause: Secondary | ICD-10-CM | POA: Insufficient documentation

## 2015-07-02 MED ORDER — DESONIDE 0.05 % EX CREA: TOPICAL_CREAM | Freq: Two times a day (BID) | CUTANEOUS | Status: DC

## 2015-07-02 NOTE — Patient Instructions (Addendum)
I do think you have allergic dermatitis on face after some exposure from lawn mowing.  Treat with desonide cream twice daily for 10 days. Let us know if not improving with this. Cool compresses on the skin can be helpful too. Flu shot today.

## 2015-07-02 NOTE — Assessment & Plan Note (Addendum)
Anticipate allergic contact dermatitis after exposure to allergen after mowing lawn a few weeks ago. Will treat with desonide cream bid x 10 days. Update if not effective to consider oral steroid course. Pt agrees with plan. Not consistent with rosacea. Pruritic, not tender

## 2015-07-02 NOTE — Progress Notes (Signed)
Pre visit review using our clinic review tool, if applicable. No additional management support is needed unless otherwise documented below in the visit note. 

## 2015-07-02 NOTE — Progress Notes (Signed)
   BP 122/78 mmHg  Pulse 68  Temp(Src) 98.2 F (36.8 C) (Oral)  Wt 129 lb (58.514 kg)  SpO2 98%   CC: red rash on face  Subjective:    Patient ID: Alexis Bentley, female    DOB: [birth year], 69 y.o.   MRN: [id number]  HPI: Alexis Bentley is a 69 y.o. female presenting on 07/02/2015 for Spots on face   3 wk h/o facial rash on cheeks - raised red spots that are itchy. This started after mowing lawn on a dry dusty day. Very itchy more than tender. progressively worsening. Started with red rash on eyelids that was also very itchy, but that is now better. Not exposed to any smoke that she knows of. No blistering.   No new lotions, detergents, soaps or shampoos. No new medicines. No new supplements.  No few foods.  No contacts with similar rash.  Spares rest of body. No oral lesions.   She did try allergy eye drops. Also tried benadryl and cortisone cream which helped.   Relevant past medical, surgical, family and social history reviewed and updated as indicated. Interim medical history since our last visit reviewed. Allergies and medications reviewed and updated. Current Outpatient Prescriptions on File Prior to Visit  Medication Sig  . apixaban (ELIQUIS) 5 MG TABS tablet Take 1 tablet (5 mg total) by mouth 2 (two) times daily.  . Calcium Carbonate-Vitamin D (CALTRATE 600+D) 600-400 MG-UNIT per tablet Take 1 tablet by mouth 2 (two) times daily.   Marland Kitchen lisinopril (PRINIVIL,ZESTRIL) 20 MG tablet Take 1 tablet (20 mg total) by mouth daily.  . metoprolol (LOPRESSOR) 50 MG tablet Take 0.5 tablets (25 mg total) by mouth 2 (two) times daily.  Marland Kitchen omeprazole (PRILOSEC) 40 MG capsule TAKE 1 CAPSULE BY MOUTH EVERY DAY  . vitamin B-12 (CYANOCOBALAMIN) 500 MCG tablet Take 1 tablet (500 mcg total) by mouth daily.  . hydrochlorothiazide (MICROZIDE) 12.5 MG capsule As needed for swelling (Patient not taking: Reported on 07/02/2015)   No current facility-administered medications on file prior to visit.     Review of Systems Per HPI unless specifically indicated above     Objective:    BP 122/78 mmHg  Pulse 68  Temp(Src) 98.2 F (36.8 C) (Oral)  Wt 129 lb (58.514 kg)  SpO2 98%  Wt Readings from Last 3 Encounters:  07/02/15 129 lb (58.514 kg)  05/19/15 133 lb 8 oz (60.555 kg)  04/08/15 134 lb 3.2 oz (60.873 kg)    Physical Exam  Constitutional: She appears well-developed and well-nourished. No distress.  HENT:  Head:    Skin: Skin is warm and dry. Rash noted. There is erythema.  Erythematous pruritic raised rash on cheeks, small isolated papule on nose, no rash on eyelids, no pustules. Separate patch on left lower anterior neck  Nursing note and vitals reviewed.     Assessment & Plan:   Problem List Items Addressed This Visit    Allergic dermatitis - Primary    Anticipate allergic contact dermatitis after exposure to allergen after mowing lawn a few weeks ago. Will treat with desonide cream bid x 10 days. Update if not effective to consider oral steroid course. Pt agrees with plan. Not consistent with rosacea. Pruritic, not tender          Follow up plan: Return if symptoms worsen or fail to improve.

## 2015-07-03 ENCOUNTER — Telehealth: Payer: Self-pay

## 2015-07-03 MED ORDER — TRIAMCINOLONE ACETONIDE 0.1 % EX CREA: 1.0000 "application " | TOPICAL_CREAM | Freq: Two times a day (BID) | CUTANEOUS | Status: DC

## 2015-07-03 NOTE — Telephone Encounter (Signed)
Sent in triamcinolone cream. It's higher potency so make sure to avoid any cream near eyes, only use for 10 days.

## 2015-07-03 NOTE — Telephone Encounter (Signed)
Pt left v/m; pt seen 07/02/15 and pt could not afford the desonide cream; cost to pt was $161.00 and pt did not pick up med. Pt request substitute to [facility] [address]. Pt request cb.

## 2015-07-03 NOTE — Telephone Encounter (Signed)
Patient notified and verbalized understanding. 

## 2015-07-03 NOTE — Telephone Encounter (Signed)
Patient called again and wanted to make sure something would be called in today.

## 2015-07-14 ENCOUNTER — Telehealth: Payer: Self-pay

## 2015-07-14 NOTE — Telephone Encounter (Signed)
Pt called and said she was now in a doughnut hole and wanted to get samples of Eliquis 5 mg. I gave 4 boxes, enough for 4 weeks. I am leaving them at the check in desk and called pt to let her know.

## 2015-08-04 LAB — HM MAMMOGRAPHY: HM Mammogram: NORMAL (ref 0–4)

## 2015-08-10 ENCOUNTER — Telehealth: Payer: Self-pay | Admitting: Internal Medicine

## 2015-08-10 NOTE — Telephone Encounter (Signed)
Pt calling to get samples of eliquis 5 mg tablets. I explained to the pt that she could call The medicare Low-income subsidy program at [phone] and they should be able to help her since the pt was in a donut hole and I also asked if she would be interested in filling out a application for the patient assistance program. Pt stated that she would wait and call the medicare low-income subsidy program. Pt had just gotten samples of eliquis 5mg  tablet in 07/14/15 with a month supply. I advised the pt that if she has any other problems, questions or concerns to call the office. Pt verbalized understanding.

## 2015-08-13 ENCOUNTER — Telehealth: Payer: Self-pay | Admitting: Internal Medicine

## 2015-08-13 NOTE — Telephone Encounter (Signed)
Called and spoke with patient and she is having trouble paying for her Eliquis.  I have put some samples out front for her and she is aware

## 2015-08-13 NOTE — Telephone Encounter (Signed)
New message      Pt want to ask a question about her eliquis.  She would not give me any more info

## 2015-09-30 ENCOUNTER — Other Ambulatory Visit: Payer: Self-pay | Admitting: Internal Medicine

## 2015-10-07 ENCOUNTER — Other Ambulatory Visit: Payer: Self-pay | Admitting: Internal Medicine

## 2015-10-08 DIAGNOSIS — H02822 Cysts of right lower eyelid: Secondary | ICD-10-CM | POA: Diagnosis not present

## 2015-10-08 DIAGNOSIS — H35372 Puckering of macula, left eye: Secondary | ICD-10-CM | POA: Diagnosis not present

## 2015-10-08 DIAGNOSIS — Z961 Presence of intraocular lens: Secondary | ICD-10-CM | POA: Diagnosis not present

## 2015-10-08 DIAGNOSIS — H35371 Puckering of macula, right eye: Secondary | ICD-10-CM | POA: Diagnosis not present

## 2015-10-15 ENCOUNTER — Encounter: Payer: Self-pay | Admitting: *Deleted

## 2015-10-26 ENCOUNTER — Encounter: Payer: Self-pay | Admitting: Internal Medicine

## 2015-10-26 ENCOUNTER — Ambulatory Visit (INDEPENDENT_AMBULATORY_CARE_PROVIDER_SITE_OTHER): Payer: Medicare Other | Admitting: Internal Medicine

## 2015-10-26 VITALS — BP 122/78 | HR 60 | Ht 65.0 in | Wt 132.4 lb

## 2015-10-26 DIAGNOSIS — I480 Paroxysmal atrial fibrillation: Secondary | ICD-10-CM

## 2015-10-26 DIAGNOSIS — I10 Essential (primary) hypertension: Secondary | ICD-10-CM | POA: Diagnosis not present

## 2015-10-26 MED ORDER — METOPROLOL TARTRATE 25 MG PO TABS: 25.0000 mg | ORAL_TABLET | Freq: Two times a day (BID) | ORAL | Status: DC

## 2015-10-26 NOTE — Progress Notes (Signed)
PCP: Ria Bush, MD  Alexis Bentley is a 70 y.o. female who presents today for routine electrophysiology followup.  Since last being seen in our clinic, the patient reports doing very well.  She has rare palpitations (several seconds) but no prolonged AF.  She had a likely vagal episode this past summer while on vacation in [place] and had syncope with this (addressed by Ms Seiler--> note reviewed).  No subsequent episodes.  Today, she denies symptoms of  chest pain, shortness of breath, lower extremity edema, dizziness, presyncope, or further syncope.   The patient is otherwise without complaint today.   Past Medical History  Diagnosis Date  . Paroxysmal atrial fibrillation ([place]) 2010    one episode  . Hypertension   . Chest pain   . Lumbar back pain     With radiculopathy  . Hemorrhoids   . Hiatal hernia   . Erosive gastritis   . Rectal prolapse   . Peptic ulcer disease   . GERD (gastroesophageal reflux disease)   . Non-ST elevation myocardial infarction (NSTEMI) ([place]) 2010    likely type 2 - demand ischemia during afib  . Transaminitis 2015    normal iron, viral hep panel, stable abd [place] (benign gallbladder polyp)  . Osteoporosis 01/07/2015    T-2.5 hip  . Syncope 03/2015    stable head CT, echo, carotids, labwork, EKG   Past Surgical History  Procedure Laterality Date  . Partial hysterectomy  1981    Bleeding, cyst, bladder tack  . Rectal prolapse repair  2005  . Colonoscopy  12/03/2006    Internal/ external hemorrhoids, no polyps  . Esophagogastroduodenoscopy  04/27/2007    Erosive and ulcerative gastritis; small H.H.  . Coronary angioplasty  08/07/2009    NML  . Total abdominal hysterectomy  2008    With a bilateral salpingo- oophorectomy  . Dexa  03/2015    T -2.5 hip    Current Outpatient Prescriptions  Medication Sig Dispense Refill  . Calcium Carbonate-Vitamin D (CALTRATE 600+D) 600-400 MG-UNIT per tablet Take 1 tablet by mouth 2 (two) times daily.     Marland Kitchen  ELIQUIS 5 MG TABS tablet TAKE 1 TABLET BY MOUTH TWICE DAILY 60 tablet 7  . lisinopril (PRINIVIL,ZESTRIL) 20 MG tablet TAKE 1 TABLET BY MOUTH EVERY DAY 30 tablet 7  . omeprazole (PRILOSEC) 40 MG capsule TAKE 1 CAPSULE BY MOUTH EVERY DAY 90 capsule 3  . vitamin B-12 (CYANOCOBALAMIN) 500 MCG tablet Take 1 tablet (500 mcg total) by mouth daily.    . hydrochlorothiazide (MICROZIDE) 12.5 MG capsule Take 12.5 mg by mouth daily as needed (Swelling). Reported on 10/26/2015     No current facility-administered medications for this visit.   ROS- all systems are reviewed and negative except as per HPI above  Physical Exam: Filed Vitals:   10/26/15 0941  BP: 122/78  Pulse: 60  Height: 5\' 5"  (1.651 m)  Weight: 132 lb 6.4 oz (60.056 kg)    GEN- The patient is well appearing, alert and oriented x 3 today.   Head- normocephalic, atraumatic Eyes-  Sclera clear, conjunctiva pink Ears- hearing intact Oropharynx- clear Lungs- Clear to ausculation bilaterally, normal work of breathing Heart- Regular rate and rhythm, no murmurs, rubs or gallops, PMI not laterally displaced GI- soft, NT, ND, + BS Extremities- no clubbing, cyanosis, or edema  ekg today reveals sinus rhythm 60 bpm, otherwise normal ekg  Assessment and Plan:  1. afib Well controlled (only 1 episode previously) chads2vasc score is  at least 3.  Continue eliquis. No changes  2. htn Stable No change required today  3. Palpitations Likely due to pacs, pvcs She takes metoprolol 25mg  bid chronically  Return in 1 year  Thompson Grayer MD, [facility] 10/26/2015 9:56 AM

## 2015-10-26 NOTE — Patient Instructions (Signed)

## 2015-11-19 ENCOUNTER — Encounter: Payer: Self-pay | Admitting: Family Medicine

## 2015-11-19 ENCOUNTER — Ambulatory Visit (INDEPENDENT_AMBULATORY_CARE_PROVIDER_SITE_OTHER): Payer: Medicare Other | Admitting: Family Medicine

## 2015-11-19 VITALS — BP 126/64 | HR 64 | Temp 97.8°F | Wt 131.5 lb

## 2015-11-19 DIAGNOSIS — I10 Essential (primary) hypertension: Secondary | ICD-10-CM

## 2015-11-19 DIAGNOSIS — E538 Deficiency of other specified B group vitamins: Secondary | ICD-10-CM

## 2015-11-19 DIAGNOSIS — I4891 Unspecified atrial fibrillation: Secondary | ICD-10-CM

## 2015-11-19 DIAGNOSIS — K219 Gastro-esophageal reflux disease without esophagitis: Secondary | ICD-10-CM

## 2015-11-19 DIAGNOSIS — R7989 Other specified abnormal findings of blood chemistry: Secondary | ICD-10-CM

## 2015-11-19 DIAGNOSIS — R3989 Other symptoms and signs involving the genitourinary system: Secondary | ICD-10-CM

## 2015-11-19 DIAGNOSIS — N3289 Other specified disorders of bladder: Secondary | ICD-10-CM | POA: Diagnosis not present

## 2015-11-19 DIAGNOSIS — M810 Age-related osteoporosis without current pathological fracture: Secondary | ICD-10-CM

## 2015-11-19 LAB — POC URINALSYSI DIPSTICK (AUTOMATED)
BILIRUBIN UA: NEGATIVE
Blood, UA: NEGATIVE
Glucose, UA: NEGATIVE
Ketones, UA: NEGATIVE
LEUKOCYTES UA: NEGATIVE
Nitrite, UA: NEGATIVE
PH UA: 7
Protein, UA: NEGATIVE
Spec Grav, UA: 1.01
Urobilinogen, UA: 0.2

## 2015-11-19 NOTE — Assessment & Plan Note (Signed)
Borderline by last dexa 03/2015.  Pt has decided against any other medication for OP. Continues cal/vit D BID as well as regular weight bearing exercise. Consider rpt dexa 2 yrs

## 2015-11-19 NOTE — Patient Instructions (Signed)
You are doing well today Urine looking ok - let us know if recurrent symptoms. Return as needed or in 6-8 months for medicare wellness visit

## 2015-11-19 NOTE — Assessment & Plan Note (Signed)
Chronic, stable. Continue current regimen. 

## 2015-11-19 NOTE — Progress Notes (Signed)
Pre visit review using our clinic review tool, if applicable. No additional management support is needed unless otherwise documented below in the visit note. 

## 2015-11-19 NOTE — Assessment & Plan Note (Signed)
UA normal today, sxs have completely resolved with azo. Discussed possible bladder irritants. Update if sxs recur.

## 2015-11-19 NOTE — Assessment & Plan Note (Signed)
Continue eliquis. Well controlled on metoprolol and lisinopril .

## 2015-11-19 NOTE — Progress Notes (Signed)
BP 126/64 mmHg  Pulse 64  Temp(Src) 97.8 F (36.6 C) (Oral)  Wt 131 lb 8 oz (59.648 kg)   CC: f/u visit  Subjective:    Patient ID: Alexis Bentley, female    DOB: [birth year], 70 y.o.   MRN: [id number]  HPI: Alexis Bentley is a 70 y.o. female presenting on 11/19/2015 for Follow-up and Bladder pressure   Pleasant pt with h/o lone afib 2010 on eliquis (CHADSVASC2 score 3), NSTEMI during afib episode, HTN, erosive gastritis/PUD on omeprazole 40mg  daily, and osteoporosis here for f/u visit.  Over the past week noticing suprapubic pressure with incomplete emptying, frequency and urgency without dysuria. No hematuria, no flank pain, nausea, fevers/chills. No recent abx. Took azo for 2 days and symptoms have improved. Currently symptom free. Drinks a few cups coffee, avoid spicy foods.   Osteoporosis - continues calcium/vit D bid. Has decided to avoid other medication for this. She does walk 30 min daily.   Due for medicare wellness visit  Relevant past medical, surgical, family and social history reviewed and updated as indicated. Interim medical history since our last visit reviewed. Allergies and medications reviewed and updated. Current Outpatient Prescriptions on File Prior to Visit  Medication Sig  . Calcium Carbonate-Vitamin D (CALTRATE 600+D) 600-400 MG-UNIT per tablet Take 1 tablet by mouth 2 (two) times daily.   Marland Kitchen ELIQUIS 5 MG TABS tablet TAKE 1 TABLET BY MOUTH TWICE DAILY  . lisinopril (PRINIVIL,ZESTRIL) 20 MG tablet TAKE 1 TABLET BY MOUTH EVERY DAY  . metoprolol tartrate (LOPRESSOR) 25 MG tablet Take 1 tablet (25 mg total) by mouth 2 (two) times daily.  Marland Kitchen omeprazole (PRILOSEC) 40 MG capsule TAKE 1 CAPSULE BY MOUTH EVERY DAY  . vitamin B-12 (CYANOCOBALAMIN) 500 MCG tablet Take 1 tablet (500 mcg total) by mouth daily.   No current facility-administered medications on file prior to visit.    Review of Systems Per HPI unless specifically indicated in ROS section       Objective:    BP 126/64 mmHg  Pulse 64  Temp(Src) 97.8 F (36.6 C) (Oral)  Wt 131 lb 8 oz (59.648 kg)  Wt Readings from Last 3 Encounters:  11/19/15 131 lb 8 oz (59.648 kg)  10/26/15 132 lb 6.4 oz (60.056 kg)  07/02/15 129 lb (58.514 kg)    Physical Exam  Constitutional: She appears well-developed and well-nourished. No distress.  HENT:  Mouth/Throat: Oropharynx is clear and moist.  Cardiovascular: Normal rate, regular rhythm, normal heart sounds and intact distal pulses.   No murmur heard. Pulmonary/Chest: Effort normal and breath sounds normal. No respiratory distress. She has no wheezes. She has no rales.  Abdominal: Soft. Bowel sounds are normal. She exhibits no distension and no mass. There is no hepatosplenomegaly. There is no tenderness. There is no rigidity, no rebound, no guarding, no CVA tenderness and negative Murphy's sign.  Musculoskeletal: She exhibits no edema.  Psychiatric: She has a normal mood and affect.  Nursing note and vitals reviewed.  Results for orders placed or performed in visit on 11/19/15  POCT Urinalysis Dipstick (Automated)  Result Value Ref Range   Color, UA Straw    Clarity, UA Clear    Glucose, UA Negative    Bilirubin, UA Negative    Ketones, UA Negative    Spec Grav, UA 1.010    Blood, UA Negative    pH, UA 7.0    Protein, UA Negative    Urobilinogen, UA 0.2    Nitrite, UA  Negative    Leukocytes, UA Negative Negative      Assessment & Plan:   Problem List Items Addressed This Visit    Sensation of pressure in bladder area - Primary    UA normal today, sxs have completely resolved with azo. Discussed possible bladder irritants. Update if sxs recur.      Relevant Orders   POCT Urinalysis Dipstick (Automated) (Completed)   Osteoporosis    Borderline by last dexa 03/2015.  Pt has decided against any other medication for OP. Continues cal/vit D BID as well as regular weight bearing exercise. Consider rpt dexa 2 yrs      Low serum  vitamin B12    Continue b12 daily. Check B12 level next visit.      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 Well controlled on metoprolol and lisinopril .       GERD    GERD with h/o PUD, erosive gastritis, HH. Continue omeprazole 40mg  daily.       Essential hypertension    Chronic, stable. Continue current regimen.          Follow up plan: Return in about 6 months (around 05/18/2016), or as needed, for medicare wellness visit.

## 2015-11-19 NOTE — Assessment & Plan Note (Signed)
Continue b12 daily. Check B12 level next visit.

## 2015-11-19 NOTE — Assessment & Plan Note (Signed)
GERD with h/o PUD, erosive gastritis, HH. Continue omeprazole 40mg  daily.

## 2015-12-28 ENCOUNTER — Encounter: Payer: Self-pay | Admitting: Internal Medicine

## 2015-12-28 ENCOUNTER — Ambulatory Visit (INDEPENDENT_AMBULATORY_CARE_PROVIDER_SITE_OTHER): Payer: Medicare Other | Admitting: Internal Medicine

## 2015-12-28 VITALS — BP 132/76 | HR 76 | Temp 98.2°F | Resp 18 | Wt 127.0 lb

## 2015-12-28 DIAGNOSIS — J069 Acute upper respiratory infection, unspecified: Secondary | ICD-10-CM | POA: Insufficient documentation

## 2015-12-28 NOTE — Patient Instructions (Signed)
Call if the gland in your neck gets bigger and more tender--I would send an antibiotic then.

## 2015-12-28 NOTE — Assessment & Plan Note (Signed)
Seems like a croup type location Discussed supportive care If the node gets more tender--would send Rx for augmentin for that

## 2015-12-28 NOTE — Progress Notes (Signed)
Pre visit review using our clinic review tool, if applicable. No additional management support is needed unless otherwise documented below in the visit note. 

## 2015-12-28 NOTE — Progress Notes (Signed)
Subjective:    Patient ID: Alexis Bentley, female    DOB: 01/12/1946, 70 y.o.   MRN: [id number]  HPI Here due to cough  Started 3 days ago Throat burning and sore--especially bad at night. Worse on left side Cough ---sometimes coughs up green stuff Mild headache  No documented fever--but was cold 2 days ago Slight chills and sweat No SOB Some ear pressure yesterday--gone today  Tried tylenol--some help mucinex---caused nausea  Current Outpatient Prescriptions on File Prior to Visit  Medication Sig Dispense Refill  . Calcium Carbonate-Vitamin D (CALTRATE 600+D) 600-400 MG-UNIT per tablet Take 1 tablet by mouth 2 (two) times daily.     Marland Kitchen ELIQUIS 5 MG TABS tablet TAKE 1 TABLET BY MOUTH TWICE DAILY 60 tablet 7  . lisinopril (PRINIVIL,ZESTRIL) 20 MG tablet TAKE 1 TABLET BY MOUTH EVERY DAY 30 tablet 7  . metoprolol tartrate (LOPRESSOR) 25 MG tablet Take 1 tablet (25 mg total) by mouth 2 (two) times daily. 180 tablet 3  . omeprazole (PRILOSEC) 40 MG capsule TAKE 1 CAPSULE BY MOUTH EVERY DAY 90 capsule 3  . vitamin B-12 (CYANOCOBALAMIN) 500 MCG tablet Take 1 tablet (500 mcg total) by mouth daily.     No current facility-administered medications on file prior to visit.    No Known Allergies  Past Medical History  Diagnosis Date  . Paroxysmal atrial fibrillation ([place]) 2010    one episode  . Hypertension   . Chest pain   . Lumbar back pain     With radiculopathy  . Hemorrhoids   . Hiatal hernia   . Erosive gastritis   . Rectal prolapse   . Peptic ulcer disease   . GERD (gastroesophageal reflux disease)   . Non-ST elevation myocardial infarction (NSTEMI) ([place]) 2010    likely type 2 - demand ischemia during afib  . Transaminitis 2015    normal iron, viral hep panel, stable abd [place] (benign gallbladder polyp)  . Osteoporosis 01/07/2015    T-2.5 hip  . Syncope 03/2015    stable head CT, echo, carotids, labwork, EKG    Past Surgical History  Procedure Laterality Date  .  Partial hysterectomy  1981    Bleeding, cyst, bladder tack  . Rectal prolapse repair  2005  . Colonoscopy  12/03/2006    Internal/ external hemorrhoids, no polyps  . Esophagogastroduodenoscopy  04/27/2007    Erosive and ulcerative gastritis; small H.H.  . Coronary angioplasty  08/07/2009    NML  . Total abdominal hysterectomy  2008    With a bilateral salpingo- oophorectomy  . Dexa  03/2015    T -2.5 hip  . Cataract extraction Bilateral 2010    Family History  Problem Relation Age of Onset  . Hypertension Mother   . COPD Mother     Prior smoker  . Coronary artery disease Mother   . Heart attack Father   . Hypertension Father   . Stroke Father     [place]istrokes  . Coronary artery disease Father     x4 stents, Pacer  . Colon cancer Father   . Testicular cancer Brother   . Prostate cancer Brother   . Diabetes type II Son     Social History   Social History  . Marital Status: Married    Spouse Name: N/A  . Number of Children: N/A  . Years of Education: N/A   Occupational History  .  [employer]   Social History Main Topics  . Smoking status: Never  Smoker   . Smokeless tobacco: Never Used  . Alcohol Use: No  . Drug Use: No  . Sexual Activity: Not on file   Other Topics Concern  . Not on file   Social History Narrative   Married   Lives with husband.  2 grown children   Daily caffeine use   Retired 2013 was [place]   Activity: walking   Diet: good water intake, fruits/vegetables daily   Review of Systems  No rash No vomiting or diarrhea Appetite is off some     Objective:   Physical Exam  Constitutional: She appears well-developed. No distress.  Dry cough  HENT:  Mouth/Throat: Oropharynx is clear and moist. No oropharyngeal exudate.  No sinus tenderness TMs normal Slight nasal inflammation on left  Neck: Normal range of motion. Neck supple.  Mildly tender slightly enlarged left anterior cervical node  Pulmonary/Chest:  Effort normal and breath sounds normal. No respiratory distress. She has no wheezes. She has no rales.          Assessment & Plan:

## 2016-03-07 ENCOUNTER — Other Ambulatory Visit: Payer: Self-pay | Admitting: Family Medicine

## 2016-05-16 ENCOUNTER — Telehealth: Payer: Self-pay | Admitting: Family Medicine

## 2016-05-16 MED ORDER — SCOPOLAMINE 1 MG/3DAYS TD PT72: 1.0000 | MEDICATED_PATCH | TRANSDERMAL | 0 refills | Status: DC

## 2016-05-16 NOTE — Telephone Encounter (Signed)
Pt left voice mail on Triage phone requesting scopolamine (TRANSDERM-SCOP) 1 MG/3DAYS patch for upcoming air travel.  Last OV for Medicare Wellness 05/2015.  [facility] [address].  Best number to call pt is [phone]

## 2016-05-16 NOTE — Telephone Encounter (Signed)
Pt aware.

## 2016-05-16 NOTE — Telephone Encounter (Signed)
plz notify patient this was sent in.

## 2016-05-22 ENCOUNTER — Other Ambulatory Visit: Payer: Self-pay | Admitting: Internal Medicine

## 2016-06-03 ENCOUNTER — Other Ambulatory Visit: Payer: Self-pay | Admitting: Family Medicine

## 2016-06-04 ENCOUNTER — Other Ambulatory Visit: Payer: Self-pay | Admitting: Internal Medicine

## 2016-07-08 ENCOUNTER — Other Ambulatory Visit: Payer: Self-pay | Admitting: Family Medicine

## 2016-07-08 ENCOUNTER — Ambulatory Visit (INDEPENDENT_AMBULATORY_CARE_PROVIDER_SITE_OTHER): Payer: Medicare Other

## 2016-07-08 ENCOUNTER — Other Ambulatory Visit (INDEPENDENT_AMBULATORY_CARE_PROVIDER_SITE_OTHER): Payer: Medicare Other

## 2016-07-08 VITALS — BP 112/68 | HR 64 | Temp 98.1°F | Ht 64.0 in | Wt 118.5 lb

## 2016-07-08 DIAGNOSIS — E538 Deficiency of other specified B group vitamins: Secondary | ICD-10-CM | POA: Diagnosis not present

## 2016-07-08 DIAGNOSIS — R7401 Elevation of levels of liver transaminase levels: Secondary | ICD-10-CM

## 2016-07-08 DIAGNOSIS — I10 Essential (primary) hypertension: Secondary | ICD-10-CM

## 2016-07-08 DIAGNOSIS — Z0000 Encounter for general adult medical examination without abnormal findings: Secondary | ICD-10-CM

## 2016-07-08 DIAGNOSIS — Z23 Encounter for immunization: Secondary | ICD-10-CM

## 2016-07-08 DIAGNOSIS — R740 Nonspecific elevation of levels of transaminase and lactic acid dehydrogenase [LDH]: Secondary | ICD-10-CM | POA: Diagnosis not present

## 2016-07-08 DIAGNOSIS — R7989 Other specified abnormal findings of blood chemistry: Secondary | ICD-10-CM

## 2016-07-08 DIAGNOSIS — E785 Hyperlipidemia, unspecified: Secondary | ICD-10-CM

## 2016-07-08 LAB — LIPID PANEL
CHOLESTEROL: 180 mg/dL (ref 0–200)
HDL: 81.8 mg/dL (ref >39.00)
LDL CALC: 85 mg/dL (ref 0–99)
NonHDL: 97.75
TRIGLYCERIDES: 62 mg/dL (ref 0.0–149.0)
Total CHOL/HDL Ratio: 2
VLDL: 12.4 mg/dL (ref 0.0–40.0)

## 2016-07-08 LAB — COMPREHENSIVE METABOLIC PANEL
ALBUMIN: 4.3 g/dL (ref 3.5–5.2)
ALK PHOS: 96 U/L (ref 39–117)
ALT: 23 U/L (ref 0–35)
AST: 24 U/L (ref 0–37)
BILIRUBIN TOTAL: 0.5 mg/dL (ref 0.2–1.2)
BUN: 20 mg/dL (ref 6–23)
CO2: 28 mEq/L (ref 19–32)
Calcium: 10 mg/dL (ref 8.4–10.5)
Chloride: 103 mEq/L (ref 96–112)
Creatinine, Ser: 0.99 mg/dL (ref 0.40–1.20)
GFR: 59.25 mL/min — ABNORMAL LOW (ref >60.00)
GLUCOSE: 91 mg/dL (ref 70–99)
Potassium: 4 mEq/L (ref 3.5–5.1)
SODIUM: 140 meq/L (ref 135–145)
TOTAL PROTEIN: 7.7 g/dL (ref 6.0–8.3)

## 2016-07-08 LAB — MICROALBUMIN / CREATININE URINE RATIO
Creatinine,U: 253.7 mg/dL
Microalb Creat Ratio: 1 mg/g (ref 0.0–30.0)
Microalb, Ur: 2.5 mg/dL — ABNORMAL HIGH (ref 0.0–1.9)

## 2016-07-08 LAB — VITAMIN B12

## 2016-07-08 NOTE — Patient Instructions (Signed)
[name] , Thank you for taking time to come for your Medicare Wellness Visit. I appreciate your ongoing commitment to your health goals. Please review the following plan we discussed and let me know if I can assist you in the future.   These are the goals we discussed: Goals    . Increase physical activity          Starting 07/08/2016, I will continue to exercise for at least 20 min 3 days per week.        This is a list of the screening recommended for you and due dates:  Health Maintenance  Topic Date Due  . Shingles Vaccine  07/08/2017*  . DTaP/Tdap/Td vaccine (1 - Tdap) 06/10/2018*  . Mammogram  09/01/2017  . Tetanus Vaccine  06/10/2018  . Colon Cancer Screening  03/08/2021  . Flu Shot  Addressed  . DEXA scan (bone density measurement)  Completed  .  Hepatitis C: One time screening is recommended by Center for Disease Control  (CDC) for  adults born from [birth year] through 1963.   Completed  . Pneumonia vaccines  Completed  *Topic was postponed. The date shown is not the original due date.   Preventive Care for Adults  A healthy lifestyle and preventive care can promote health and wellness. Preventive health guidelines for adults include the following key practices.  . A routine yearly physical is a good way to check with your health care provider about your health and preventive screening. It is a chance to share any concerns and updates on your health and to receive a thorough exam.  . Visit your dentist for a routine exam and preventive care every 6 months. Brush your teeth twice a day and floss once a day. Good oral hygiene prevents tooth decay and gum disease.  . The frequency of eye exams is based on your age, health, family medical history, use  of contact lenses, and other factors. Follow your health care provider's ecommendations for frequency of eye exams.  . Eat a healthy diet. Foods like vegetables, fruits, whole grains, low-fat dairy products, and lean protein  foods contain the nutrients you need without too many calories. Decrease your intake of foods high in solid fats, added sugars, and salt. Eat the right amount of calories for you. Get information about a proper diet from your health care provider, if necessary.  . Regular physical exercise is one of the most important things you can do for your health. Most adults should get at least 150 minutes of moderate-intensity exercise (any activity that increases your heart rate and causes you to sweat) each week. In addition, most adults need muscle-strengthening exercises on 2 or more days a week.  Silver Sneakers may be a benefit available to you. To determine eligibility, you may visit the website: www.silversneakers.com or contact program at 831-800-1552 Mon-Fri between 8AM-8PM.   . Maintain a healthy weight. The body mass index (BMI) is a screening tool to identify possible weight problems. It provides an estimate of body fat based on height and weight. Your health care provider can find your BMI and can help you achieve or maintain a healthy weight.   For adults 20 years and older: ? A BMI below 18.5 is considered underweight. ? A BMI of 18.5 to 24.9 is normal. ? A BMI of 25 to 29.9 is considered overweight. ? A BMI of 30 and above is considered obese.   . Maintain normal blood lipids and cholesterol levels by exercising  and minimizing your intake of saturated fat. Eat a balanced diet with plenty of fruit and vegetables. Blood tests for lipids and cholesterol should begin at age 35 and be repeated every 5 years. If your lipid or cholesterol levels are high, you are over 50, or you are at high risk for heart disease, you may need your cholesterol levels checked more frequently. Ongoing high lipid and cholesterol levels should be treated with medicines if diet and exercise are not working.  . If you smoke, find out from your health care provider how to quit. If you do not use tobacco, please do not  start.  . If you choose to drink alcohol, please do not consume more than 2 drinks per day. One drink is considered to be 12 ounces (355 mL) of beer, 5 ounces (148 mL) of wine, or 1.5 ounces (44 mL) of liquor.  . If you are 29-37 years old, ask your health care provider if you should take aspirin to prevent strokes.  . Use sunscreen. Apply sunscreen liberally and repeatedly throughout the day. You should seek shade when your shadow is shorter than you. Protect yourself by wearing long sleeves, pants, a wide-brimmed hat, and sunglasses year round, whenever you are outdoors.  . Once a month, do a whole body skin exam, using a mirror to look at the skin on your back. Tell your health care provider of new moles, moles that have irregular borders, moles that are larger than a pencil eraser, or moles that have changed in shape or color.

## 2016-07-10 NOTE — Progress Notes (Signed)
Subjective:   Alexis Bentley is a 70 y.o. female who presents for Medicare Annual (Subsequent) preventive examination.  Review of Systems:  N/A Cardiac Risk Factors include: advanced age (>20men, >69 women);dyslipidemia;hypertension     Objective:     Vitals: BP 112/68 (BP Location: Left Arm, Patient Position: Sitting, Cuff Size: Normal)   Pulse 64   Temp 98.1 F (36.7 C) (Oral)   Ht 5\' 4"  (1.626 m) Comment: no shoes  Wt 118 lb 8 oz (53.8 kg)   SpO2 99%   BMI 20.34 kg/m   Body mass index is 20.34 kg/m.   Tobacco History  Smoking Status  . Never Smoker  Smokeless Tobacco  . Never Used     Counseling given: No   Past Medical History:  Diagnosis Date  . Chest pain   . Erosive gastritis   . GERD (gastroesophageal reflux disease)   . Hemorrhoids   . Hiatal hernia   . Hypertension   . Lumbar back pain    With radiculopathy  . Non-ST elevation myocardial infarction (NSTEMI) ([place]) 2010   likely type 2 - demand ischemia during afib  . Osteoporosis 01/07/2015   T-2.5 hip  . Paroxysmal atrial fibrillation ([place]) 2010   one episode  . Peptic ulcer disease   . Rectal prolapse   . Syncope 03/2015   stable head CT, echo, carotids, labwork, EKG  . Transaminitis 2015   normal iron, viral hep panel, stable abd [place] (benign gallbladder polyp)   Past Surgical History:  Procedure Laterality Date  . CATARACT EXTRACTION Bilateral 2010  . COLONOSCOPY  12/03/2006   Internal/ external hemorrhoids, no polyps  . CORONARY ANGIOPLASTY  08/07/2009   NML  . DEXA  03/2015   T -2.5 hip  . ESOPHAGOGASTRODUODENOSCOPY  04/27/2007   Erosive and ulcerative gastritis; small H.H.  . PARTIAL HYSTERECTOMY  1981   Bleeding, cyst, bladder tack  . RECTAL PROLAPSE REPAIR  2005  . TOTAL ABDOMINAL HYSTERECTOMY  2008   With a bilateral salpingo- oophorectomy   Family History  Problem Relation Age of Onset  . Hypertension Mother   . COPD Mother     Prior smoker  . Coronary artery disease  Mother   . Heart attack Father   . Hypertension Father   . Stroke Father     [place]  . Coronary artery disease Father     x4 stents, Pacer  . Colon cancer Father   . Testicular cancer Brother   . Prostate cancer Brother   . Diabetes type II Son    History  Sexual Activity  . Sexual activity: Yes    Outpatient Encounter Prescriptions as of 07/08/2016  Medication Sig  . Calcium Carbonate-Vitamin D (CALTRATE 600+D) 600-400 MG-UNIT per tablet Take 1 tablet by mouth 2 (two) times daily.   Marland Kitchen ELIQUIS 5 MG TABS tablet TAKE 1 TABLET BY MOUTH TWICE DAILY  . lisinopril (PRINIVIL,ZESTRIL) 20 MG tablet TAKE 1 TABLET BY MOUTH EVERY DAY  . metoprolol tartrate (LOPRESSOR) 25 MG tablet Take 1 tablet (25 mg total) by mouth 2 (two) times daily.  Marland Kitchen omeprazole (PRILOSEC) 40 MG capsule TAKE 1 CAPSULE BY MOUTH EVERY DAY  . vitamin B-12 (CYANOCOBALAMIN) 500 MCG tablet Take 1 tablet (500 mcg total) by mouth daily.  Marland Kitchen scopolamine (TRANSDERM-SCOP) 1 MG/3DAYS Place 1 patch (1.5 mg total) onto the skin every 3 (three) days. (Patient not taking: Reported on 07/08/2016)   No facility-administered encounter medications on file as of 07/08/2016.  Activities of Daily Living In your present state of health, do you have any difficulty performing the following activities: 07/08/2016  Hearing? N  Vision? N  Difficulty concentrating or making decisions? N  Walking or climbing stairs? N  Dressing or bathing? N  Doing errands, shopping? N  Preparing Food and eating ? N  Using the Toilet? N  In the past six months, have you accidently leaked urine? N  Do you have problems with loss of bowel control? N  Managing your Medications? N  Managing your Finances? N  Housekeeping or managing your Housekeeping? N  Some recent data might be hidden    Patient Care Team: Ria Bush, MD as PCP - General (Family Medicine)    Assessment:     Hearing Screening   125Hz  250Hz  500Hz  1000Hz  2000Hz  3000Hz  4000Hz   6000Hz  8000Hz   Right ear:   40 40 40  40    Left ear:   40 40 40  40    Vision Screening Comments: Last vision exam in Jan 2017 with Dr. Herbert Deaner   Exercise Activities and Dietary recommendations Current Exercise Habits: Home exercise routine, Type of exercise: treadmill, Time (Minutes): 20, Frequency (Times/Week): 3, Weekly Exercise (Minutes/Week): 60, Intensity: Mild, Exercise limited by: None identified  Goals    . Increase physical activity          Starting 07/08/2016, I will continue to exercise for at least 20 min 3 days per week.       Fall Risk Fall Risk  07/08/2016 05/19/2015  Falls in the past year? No No   Depression Screen PHQ 2/9 Scores 07/08/2016 05/19/2015  PHQ - 2 Score 0 0     Cognitive Testing MMSE - Mini Mental State Exam 07/08/2016  Orientation to time 5  Orientation to Place 5  Registration 3  Attention/ Calculation 0  Recall 3  Language- name 2 objects 0  Language- repeat 1  Language- follow 3 step command 3  Language- read & follow direction 0  Write a sentence 0  Copy design 0  Total score 20   PLEASE NOTE: A Mini-Cog screen was completed. Maximum score is 20. A value of 0 denotes this part of [place] MMSE was not completed or the patient failed this part of the Mini-Cog screening.   Mini-Cog Screening Orientation to Time - Max 5 pts Orientation to Place - Max 5 pts Registration - Max 3 pts Recall - Max 3 pts Language Repeat - Max 1 pts Language Follow 3 Step Command - Max 3 pts   Immunization History  Administered Date(s) Administered  . Influenza,inj,Quad PF,36+ Mos 07/10/2014, 07/02/2015, 07/08/2016  . Influenza-Unspecified 07/03/2013  . Pneumococcal Conjugate-13 05/19/2015  . Pneumococcal Polysaccharide-23 07/08/2016  . Td 10/03/1993, 06/10/2008   Screening Tests Health Maintenance  Topic Date Due  . ZOSTAVAX  07/08/2017 (Originally 04/27/2008)  . DTaP/Tdap/Td (1 - Tdap) 06/10/2018 (Originally 06/11/2008)  . MAMMOGRAM  09/01/2017  .  TETANUS/TDAP  06/10/2018  . COLONOSCOPY  03/08/2021  . INFLUENZA VACCINE  Addressed  . DEXA SCAN  Completed  . Hepatitis C Screening  Completed  . PNA vac Low Risk Adult  Completed      Plan:   I have personally reviewed and addressed the Medicare Annual Wellness questionnaire and have noted the following in the patient's chart:  A. Medical and social history B. Use of alcohol, tobacco or illicit drugs  C. Current medications and supplements D. Functional ability and status E.  Nutritional status F.  Physical activity G. Advance directives H. List of other physicians I.  Hospitalizations, surgeries, and ER visits in previous 12 months J.  [place] to include hearing, vision, cognitive, depression L. Referrals and appointments - none  In addition, I have reviewed and discussed with patient certain preventive protocols, quality metrics, and best practice recommendations. A written personalized care plan for preventive services as well as general preventive health recommendations were provided to patient.  See attached scanned questionnaire for additional information.   Signed,   Lindell Noe, MHA, BS, LPN Health Advisor

## 2016-07-10 NOTE — Progress Notes (Signed)
Pre visit review using our clinic review tool, if applicable. No additional management support is needed unless otherwise documented below in the visit note. 

## 2016-07-10 NOTE — Progress Notes (Signed)
PCP notes:   Health maintenance:  Flu vaccine - administered PPSV23 - administered   Abnormal screenings:   None   Patient concerns:   None  Nurse concerns:  None  Next PCP appt:   07/15/16 @ 1500

## 2016-07-11 NOTE — Progress Notes (Signed)
I reviewed health advisor's note, was available for consultation, and agree with documentation and plan.  

## 2016-07-15 ENCOUNTER — Encounter: Payer: Self-pay | Admitting: Family Medicine

## 2016-07-15 ENCOUNTER — Ambulatory Visit (INDEPENDENT_AMBULATORY_CARE_PROVIDER_SITE_OTHER): Payer: Medicare Other | Admitting: Family Medicine

## 2016-07-15 VITALS — BP 130/70 | HR 72 | Temp 97.8°F | Wt 118.0 lb

## 2016-07-15 DIAGNOSIS — R740 Nonspecific elevation of levels of transaminase and lactic acid dehydrogenase [LDH]: Secondary | ICD-10-CM | POA: Diagnosis not present

## 2016-07-15 DIAGNOSIS — I4891 Unspecified atrial fibrillation: Secondary | ICD-10-CM | POA: Diagnosis not present

## 2016-07-15 DIAGNOSIS — E785 Hyperlipidemia, unspecified: Secondary | ICD-10-CM

## 2016-07-15 DIAGNOSIS — R7989 Other specified abnormal findings of blood chemistry: Secondary | ICD-10-CM

## 2016-07-15 DIAGNOSIS — M810 Age-related osteoporosis without current pathological fracture: Secondary | ICD-10-CM | POA: Diagnosis not present

## 2016-07-15 DIAGNOSIS — Z7189 Other specified counseling: Secondary | ICD-10-CM

## 2016-07-15 DIAGNOSIS — R7401 Elevation of levels of liver transaminase levels: Secondary | ICD-10-CM

## 2016-07-15 DIAGNOSIS — I10 Essential (primary) hypertension: Secondary | ICD-10-CM

## 2016-07-15 DIAGNOSIS — E538 Deficiency of other specified B group vitamins: Secondary | ICD-10-CM

## 2016-07-15 NOTE — Assessment & Plan Note (Signed)
Borderline osteoporosis. Pt endorses compliance with calcium/vit D BID and regular weight bearing exercises.  Declined bisphosphonate.  Will be due for updated DEXA 03/2017 to monitor for progression, would consider bisphosphonate if progression noted.

## 2016-07-15 NOTE — Assessment & Plan Note (Signed)
Advanced directive: packet provided last week. Pt will work on this.

## 2016-07-15 NOTE — Assessment & Plan Note (Signed)
No trouble noted today. Continue to monitor.

## 2016-07-15 NOTE — Assessment & Plan Note (Addendum)
Continue eliquis for lone atrial fibrillation. Rate controlled on metoprolol

## 2016-07-15 NOTE — Assessment & Plan Note (Signed)
Chronic, stable. Continue current regimen. 

## 2016-07-15 NOTE — Assessment & Plan Note (Signed)
b12 has been repleted. Suggested MWF dosing.

## 2016-07-15 NOTE — Progress Notes (Signed)
Pre visit review using our clinic review tool, if applicable. No additional management support is needed unless otherwise documented below in the visit note. 

## 2016-07-15 NOTE — Progress Notes (Signed)
.   BP 130/70   Pulse 72   Temp 97.8 F (36.6 C) (Oral)   Wt 118 lb (53.5 kg)   BMI 20.25 kg/m    CC: medicare f/u Subjective:    Patient ID: Alexis Bentley, female    DOB: 1946/09/12, 71 y.o.   MRN: [id number]  HPI: Alexis Bentley is a 70 y.o. female presenting on 07/15/2016 for Annual Exam   Saw [name] last week for medicare wellness visit, note reviewed.   Preventative: COLONOSCOPY 12/03/2006; Internal/ external hemorrhoids, no polyps Well woman exam - with OBGYN Dr. Vernie Ammons 08/2015 [facility]) [place] 08/2015 DEXA - 03/2015. Declined bisphosphonate. Will need rpt 03/2017. Flu yearly prevnar 2016, pneumovax 2017 Td 2009 zostavax - to check with insurance  Advanced directive: packet provided last week. Pt will work on this. Seat belt use discussed Sunscreen use discussed. No changing moles on skin. Non smoker Alcohol - none  Married Lives with husband.  2 grown children Daily caffeine use Retired 2013 was [place] Activity: walking Diet: good water intake, fruits/vegetables daily  Relevant past medical, surgical, family and social history reviewed and updated as indicated. Interim medical history since our last visit reviewed. Allergies and medications reviewed and updated. Current Outpatient Prescriptions on File Prior to Visit  Medication Sig  . Calcium Carbonate-Vitamin D (CALTRATE 600+D) 600-400 MG-UNIT per tablet Take 1 tablet by mouth 2 (two) times daily.   Marland Kitchen ELIQUIS 5 MG TABS tablet TAKE 1 TABLET BY MOUTH TWICE DAILY  . lisinopril (PRINIVIL,ZESTRIL) 20 MG tablet TAKE 1 TABLET BY MOUTH EVERY DAY  . metoprolol tartrate (LOPRESSOR) 25 MG tablet Take 1 tablet (25 mg total) by mouth 2 (two) times daily.  Marland Kitchen omeprazole (PRILOSEC) 40 MG capsule TAKE 1 CAPSULE BY MOUTH EVERY DAY  . scopolamine (TRANSDERM-SCOP) 1 MG/3DAYS Place 1 patch (1.5 mg total) onto the skin every 3 (three) days. (Patient not taking: Reported on 07/15/2016)   No current  facility-administered medications on file prior to visit.     Review of Systems Per HPI unless specifically indicated in ROS section     Objective:    BP 130/70   Pulse 72   Temp 97.8 F (36.6 C) (Oral)   Wt 118 lb (53.5 kg)   BMI 20.25 kg/m   Wt Readings from Last 3 Encounters:  07/15/16 118 lb (53.5 kg)  07/08/16 118 lb 8 oz (53.8 kg)  12/28/15 127 lb (57.6 kg)    Physical Exam  Constitutional: She appears well-developed and well-nourished. No distress.  HENT:  Mouth/Throat: Oropharynx is clear and moist. No oropharyngeal exudate.  Eyes: Conjunctivae and EOM are normal. Pupils are equal, round, and reactive to light.  Neck: Normal range of motion. Neck supple. Carotid bruit is not present. No thyromegaly present.  Cardiovascular: Normal rate, regular rhythm, normal heart sounds and intact distal pulses.   No murmur heard. Pulmonary/Chest: Effort normal and breath sounds normal. No respiratory distress. She has no wheezes. She has no rales.  Abdominal: Soft. Bowel sounds are normal. She exhibits no distension and no mass. There is no tenderness. There is no rebound and no guarding.  Musculoskeletal: She exhibits no edema.  Lymphadenopathy:    She has no cervical adenopathy.  Skin: Skin is warm and dry. No rash noted.  Nursing note and vitals reviewed.  Results for orders placed or performed in visit on 07/15/16  HM MAMMOGRAPHY  Result Value Ref Range   HM Mammogram Self Reported Normal 0-4 Bi-Rad, Self Reported  Normal      Assessment & Plan:  Over 25 minutes were spent face-to-face with the patient during this encounter and >50% of that time was spent on counseling and coordination of care  Problem List Items Addressed This Visit    Advanced care planning/counseling discussion    Advanced directive: packet provided last week. Pt will work on this.      Essential hypertension    Chronic, stable. Continue current regimen.      HLD (hyperlipidemia)    No trouble  noted today. Continue to monitor.       Lone atrial fibrillation ([place])    Continue eliquis for lone atrial fibrillation. Rate controlled on metoprolol      Low serum vitamin B12    b12 has been repleted. Suggested MWF dosing.      Osteoporosis - Primary    Borderline osteoporosis. Pt endorses compliance with calcium/vit D BID and regular weight bearing exercises.  Declined bisphosphonate.  Will be due for updated DEXA 03/2017 to monitor for progression, would consider bisphosphonate if progression noted.       Transaminitis    This has resolved.       Other Visit Diagnoses   None.      Follow up plan: Return in about 1 year (around 07/15/2017) for follow up visit, medicare wellness visit.  Ria Bush, MD

## 2016-07-15 NOTE — Patient Instructions (Addendum)
Call your insurance about the shingles shot to see if it is covered or how much it would cost and where is cheaper (here or pharmacy).  If you want to receive here, call for nurse visit.  Decrease b12 to MWF only. Labs were good. You are doing well today. Return as needed or in 1 year for next f/u visit

## 2016-07-15 NOTE — Assessment & Plan Note (Signed)
This has resolved.

## 2016-08-18 ENCOUNTER — Telehealth: Payer: Self-pay | Admitting: Internal Medicine

## 2016-08-18 NOTE — Telephone Encounter (Signed)
Pt calling requesting samples of Eliquis 5 mg tablet, taken BID, pt stated that she was in the Donut hole and needed some samples. I informed the pt that I was leaving 3 weeks supply of Eliquis 5 mg samples at the front desk for pt to pick up. I advised the pt that if she has any other problems, questions or concerns to call the office. Pt verbalized understanding. Eliquis 5 mg   LOT#  [id number]     Exp: March 2020

## 2016-08-30 ENCOUNTER — Other Ambulatory Visit: Payer: Self-pay | Admitting: Family Medicine

## 2016-10-03 DIAGNOSIS — K824 Cholesterolosis of gallbladder: Secondary | ICD-10-CM

## 2016-10-03 HISTORY — DX: Cholesterolosis of gallbladder: K82.4

## 2016-10-03 HISTORY — PX: ESOPHAGOGASTRODUODENOSCOPY: SHX1529

## 2016-10-10 ENCOUNTER — Encounter: Payer: Self-pay | Admitting: Family Medicine

## 2016-10-10 ENCOUNTER — Ambulatory Visit (INDEPENDENT_AMBULATORY_CARE_PROVIDER_SITE_OTHER): Payer: Medicare Other | Admitting: Family Medicine

## 2016-10-10 VITALS — BP 128/74 | HR 67 | Temp 97.6°F | Wt 110.0 lb

## 2016-10-10 DIAGNOSIS — Z1211 Encounter for screening for malignant neoplasm of colon: Secondary | ICD-10-CM | POA: Diagnosis not present

## 2016-10-10 DIAGNOSIS — Z8711 Personal history of peptic ulcer disease: Secondary | ICD-10-CM

## 2016-10-10 DIAGNOSIS — K2960 Other gastritis without bleeding: Secondary | ICD-10-CM

## 2016-10-10 DIAGNOSIS — R112 Nausea with vomiting, unspecified: Secondary | ICD-10-CM | POA: Insufficient documentation

## 2016-10-10 DIAGNOSIS — F411 Generalized anxiety disorder: Secondary | ICD-10-CM | POA: Diagnosis not present

## 2016-10-10 DIAGNOSIS — K449 Diaphragmatic hernia without obstruction or gangrene: Secondary | ICD-10-CM

## 2016-10-10 LAB — CBC WITH DIFFERENTIAL/PLATELET
BASOS PCT: 0.6 % (ref 0.0–3.0)
Basophils Absolute: 0 10*3/uL (ref 0.0–0.1)
EOS PCT: 0.9 % (ref 0.0–5.0)
Eosinophils Absolute: 0.1 10*3/uL (ref 0.0–0.7)
HCT: 38.8 % (ref 36.0–46.0)
HEMOGLOBIN: 13.2 g/dL (ref 12.0–15.0)
Lymphocytes Relative: 21.2 % (ref 12.0–46.0)
Lymphs Abs: 1.7 10*3/uL (ref 0.7–4.0)
MCHC: 34.1 g/dL (ref 30.0–36.0)
MCV: 91.1 fl (ref 78.0–100.0)
MONO ABS: 0.5 10*3/uL (ref 0.1–1.0)
MONOS PCT: 5.8 % (ref 3.0–12.0)
Neutro Abs: 5.6 10*3/uL (ref 1.4–7.7)
Neutrophils Relative %: 71.5 % (ref 43.0–77.0)
Platelets: 264 10*3/uL (ref 150.0–400.0)
RBC: 4.26 Mil/uL (ref 3.87–5.11)
RDW: 12.9 % (ref 11.5–15.5)
WBC: 7.8 10*3/uL (ref 4.0–10.5)

## 2016-10-10 LAB — COMPREHENSIVE METABOLIC PANEL
ALBUMIN: 4.5 g/dL (ref 3.5–5.2)
ALT: 28 U/L (ref 0–35)
AST: 25 U/L (ref 0–37)
Alkaline Phosphatase: 84 U/L (ref 39–117)
BUN: 21 mg/dL (ref 6–23)
CHLORIDE: 102 meq/L (ref 96–112)
CO2: 28 mEq/L (ref 19–32)
Calcium: 10.2 mg/dL (ref 8.4–10.5)
Creatinine, Ser: 0.9 mg/dL (ref 0.40–1.20)
GFR: 66.09 mL/min (ref >60.00)
GLUCOSE: 84 mg/dL (ref 70–99)
POTASSIUM: 4.5 meq/L (ref 3.5–5.1)
SODIUM: 139 meq/L (ref 135–145)
Total Bilirubin: 0.4 mg/dL (ref 0.2–1.2)
Total Protein: 8.1 g/dL (ref 6.0–8.3)

## 2016-10-10 LAB — LIPASE: Lipase: 40 U/L (ref 11.0–59.0)

## 2016-10-10 MED ORDER — HYDROXYZINE HCL 10 MG PO TABS: 10.0000 mg | ORAL_TABLET | Freq: Two times a day (BID) | ORAL | 0 refills | Status: DC | PRN

## 2016-10-10 NOTE — Assessment & Plan Note (Addendum)
New over last few months with some weight loss noted as well as mild early satiety. Start with labs today (CBC, CMP, lipase) then refer to GI for further evaluation, consider EGD to r/o mass as cause.  Will check abd [place] to eval for gallstones.  Continue omeprazole 40mg  daily. Due for colonoscopy - will refer to GI for this as well.

## 2016-10-10 NOTE — Progress Notes (Signed)
BP 128/74   Pulse 67   Temp 97.6 F (36.4 C)   Wt 110 lb (49.9 kg)   SpO2 94%   BMI 18.88 kg/m    CC: abd pain Subjective:    Patient ID: Alexis Bentley, female    DOB: 18-Jun-1946, 71 y.o.   MRN: [id number]  HPI: Alexis Bentley is a 71 y.o. female presenting on 10/10/2016 for Abdominal Pain; Diarrhea; and Emesis (started on November. )   Several month history of intermittent GI distress associated with nausea/vomiting and some diarrhea. This only happens early mornings and about once a week - wakes her up around 2am. Significant nausea/vomiting, last night more diarrhea. 8 lb weight loss since October. Decreased appetite with possible mild early satiety. Continues omeprazole 40mg  daily.   No fevers/chills No blood in emesis. No dysphagia. No night sweats.   Drinks 2 cups coffee daily in the morning.  No alcohol. No NSAIDs.   Increased family stress - son with medical problems.  Dinner is at 6pm.  No new meds, supplement. No new foods or restaurants.  Uses well water but drinks bottled water. No sick contacts at home. No recent travel. She did travel to [facility] 06/2016.   ESOPHAGOGASTRODUODENOSCOPY 04/27/2007 Erosive and ulcerative gastritis; small H.H. COLONOSCOPY 12/03/2006 Internal/ external hemorrhoids, no polyps  Relevant past medical, surgical, family and social history reviewed and updated as indicated. Interim medical history since our last visit reviewed. Allergies and medications reviewed and updated. Current Outpatient Prescriptions on File Prior to Visit  Medication Sig  . Calcium Carbonate-Vitamin D (CALTRATE 600+D) 600-400 MG-UNIT per tablet Take 1 tablet by mouth 2 (two) times daily.   Marland Kitchen ELIQUIS 5 MG TABS tablet TAKE 1 TABLET BY MOUTH TWICE DAILY  . lisinopril (PRINIVIL,ZESTRIL) 20 MG tablet TAKE 1 TABLET BY MOUTH EVERY DAY  . metoprolol tartrate (LOPRESSOR) 25 MG tablet Take 1 tablet (25 mg total) by mouth 2 (two) times daily.  Marland Kitchen omeprazole (PRILOSEC) 40 MG  capsule TAKE 1 CAPSULE BY MOUTH EVERY DAY  . vitamin B-12 (CYANOCOBALAMIN) 500 MCG tablet Take 500 mcg by mouth every Monday, Wednesday, and Friday.   No current facility-administered medications on file prior to visit.     Review of Systems Per HPI unless specifically indicated in ROS section     Objective:    BP 128/74   Pulse 67   Temp 97.6 F (36.4 C)   Wt 110 lb (49.9 kg)   SpO2 94%   BMI 18.88 kg/m   Wt Readings from Last 3 Encounters:  10/10/16 110 lb (49.9 kg)  07/15/16 118 lb (53.5 kg)  07/08/16 118 lb 8 oz (53.8 kg)    Physical Exam  Constitutional: She appears well-developed and well-nourished. No distress.  HENT:  Mouth/Throat: Oropharynx is clear and moist. No oropharyngeal exudate.  Neck: Normal range of motion. Neck supple. No thyromegaly present.  Cardiovascular: Normal rate, regular rhythm and intact distal pulses.   No murmur heard. Pulmonary/Chest: Effort normal and breath sounds normal. No respiratory distress. She has no wheezes. She has no rales.  Abdominal: Soft. Normal appearance and bowel sounds are normal. There is no hepatosplenomegaly. There is no tenderness. There is no rigidity, no guarding, no CVA tenderness, no tenderness at McBurney's point and negative Murphy's sign. No hernia.  Lymphadenopathy:    She has no cervical adenopathy.  Skin: Skin is warm and dry. No rash noted.  Psychiatric: She has a normal mood and affect.  Nursing note and  vitals reviewed.  Results for orders placed or performed in visit on 07/15/16  HM MAMMOGRAPHY  Result Value Ref Range   HM Mammogram Self Reported Normal 0-4 Bi-Rad, Self Reported Normal      Assessment & Plan:   Problem List Items Addressed This Visit    Anxiety state    Increased stress over son's illness. Desires medications for PRN anxiety and pt desires to avoid possibly habit forming medication - will treat with hydroxyzine 10mg  PRN. Discussed dry mouth, sedation side effects.        Relevant Medications   hydrOXYzine (ATARAX/VISTARIL) 10 MG tablet   Erosive gastritis   Hiatal hernia   History of peptic ulcer disease   Non-intractable vomiting with nausea - Primary    New over last few months with some weight loss noted as well as mild early satiety. Start with labs today (CBC, CMP, lipase) then refer to GI for further evaluation, consider EGD to r/o mass as cause.  Will check abd [place] to eval for gallstones.  Continue omeprazole 40mg  daily. Due for colonoscopy - will refer to GI for this as well.       Relevant Orders   Comprehensive metabolic panel   CBC with Differential/Platelet   Lipase   Ambulatory referral to Gastroenterology   US Abdomen Limited RUQ    Other Visit Diagnoses    Special screening for malignant neoplasms, colon       Relevant Orders   Ambulatory referral to Gastroenterology       Follow up plan: Return if symptoms worsen or fail to improve.  Ria Bush, MD

## 2016-10-10 NOTE — Patient Instructions (Addendum)
Labs today. Continue omeprazole.  We will refer you back to GI to consider endoscopy and for colonoscopy as you're due.  Trial hydroxyzine for nerves.

## 2016-10-10 NOTE — Assessment & Plan Note (Signed)
Increased stress over son's illness. Desires medications for PRN anxiety and pt desires to avoid possibly habit forming medication - will treat with hydroxyzine 10mg  PRN. Discussed dry mouth, sedation side effects.

## 2016-10-11 ENCOUNTER — Ambulatory Visit (INDEPENDENT_AMBULATORY_CARE_PROVIDER_SITE_OTHER): Payer: Medicare Other | Admitting: Internal Medicine

## 2016-10-11 ENCOUNTER — Encounter: Payer: Self-pay | Admitting: Internal Medicine

## 2016-10-11 VITALS — BP 96/58 | HR 72 | Ht 64.0 in | Wt 113.0 lb

## 2016-10-11 DIAGNOSIS — R197 Diarrhea, unspecified: Secondary | ICD-10-CM | POA: Diagnosis not present

## 2016-10-11 DIAGNOSIS — R634 Abnormal weight loss: Secondary | ICD-10-CM

## 2016-10-11 DIAGNOSIS — F439 Reaction to severe stress, unspecified: Secondary | ICD-10-CM

## 2016-10-11 DIAGNOSIS — G43A0 Cyclical vomiting, not intractable: Secondary | ICD-10-CM

## 2016-10-11 DIAGNOSIS — R1115 Cyclical vomiting syndrome unrelated to migraine: Secondary | ICD-10-CM

## 2016-10-11 MED ORDER — PROMETHAZINE HCL 12.5 MG PO TABS: 12.5000 mg | ORAL_TABLET | Freq: Every evening | ORAL | 0 refills | Status: DC | PRN

## 2016-10-11 NOTE — Progress Notes (Signed)
Alexis Bentley 71 y.o. 1946-04-16 [id number]  Assessment & Plan:   Encounter Diagnoses  Name Primary?  . Loss of weight Yes  . Non-intractable cyclical vomiting with nausea   . Diarrhea, unspecified type   . Situational stress       1. Nausea/vomiting/diarrhea/weigjht loss- will schedule EGD to evaluate symptoms given unintentional weight loss. Phenergan given for nausea    2. Situational stress- may be responsible for symptoms but can not rule out other causes   Will do EGD on Eliquis If/when she needs a colonoscopy will need to hold - last screen 5/08 so due 01/2017  Alexis Hurl, PA-S  I have seen the patient with [name]  and she has served as a Education administrator.  Alexis Mayer, MD, Alexis Bentley  Subjective:   Chief Complaint: Nausea/vomiting and occasional diarrhea. Unintentional weight loss and decreased appetite   HPI 71 year old female presents with nausea and vomiting which has occurred on 3 separate occasions since early December.  The nausea wakes her up around 2-3 A.M followed by 2-3 times episode of vomitting.  Most recent episode of nausea occurred last night accompanied with diarrhea but no vomiting.  Admits to increased stress and an unintentional weight loss of about 8 pounds since October.  Denies hematemesis, melena, or dysphagia. Denies abdominal pain or abdominal surgeries.  Takes Omeprazole daily for heartburn.  Prior EGD in 2008 showed ulcerative gastritis, H pylori negative.   Current Meds  Medication Sig  . Calcium Carbonate-Vitamin D (CALTRATE 600+D) 600-400 MG-UNIT per tablet Take 1 tablet by mouth 2 (two) times daily.   Marland Kitchen ELIQUIS 5 MG TABS tablet TAKE 1 TABLET BY MOUTH TWICE DAILY  . hydrOXYzine (ATARAX/VISTARIL) 10 MG tablet Take 1 tablet (10 mg total) by mouth 2 (two) times daily as needed for anxiety.  Marland Kitchen lisinopril (PRINIVIL,ZESTRIL) 20 MG tablet TAKE 1 TABLET BY MOUTH EVERY DAY  . metoprolol tartrate (LOPRESSOR) 25 MG tablet Take 1 tablet (25 mg total) by  mouth 2 (two) times daily.  Marland Kitchen omeprazole (PRILOSEC) 40 MG capsule TAKE 1 CAPSULE BY MOUTH EVERY DAY  . vitamin B-12 (CYANOCOBALAMIN) 500 MCG tablet Take 500 mcg by mouth every Monday, Wednesday, and Friday.   No Known Allergies  Past Medical History:  Diagnosis Date  . Chest pain   . Erosive gastritis   . GERD (gastroesophageal reflux disease)   . Hemorrhoids   . Hiatal hernia   . Hypertension   . Lumbar back pain    With radiculopathy  . Non-ST elevation myocardial infarction (NSTEMI) ([place]) 2010   likely type 2 - demand ischemia during afib  . Osteoporosis 01/07/2015   T-2.5 hip  . Paroxysmal atrial fibrillation ([place]) 2010   one episode  . Peptic ulcer disease   . Rectal prolapse   . Syncope 03/2015   stable head CT, echo, carotids, labwork, EKG  . Transaminitis 2015   normal iron, viral hep panel, stable abd [place] (benign gallbladder polyp)   Past Surgical History:  Procedure Laterality Date  . CATARACT EXTRACTION Bilateral 2010  . COLONOSCOPY  12/03/2006   Internal/ external hemorrhoids, no polyps  . CORONARY ANGIOPLASTY  08/07/2009   NML  . DEXA  03/2015   T -2.5 hip  . ESOPHAGOGASTRODUODENOSCOPY  04/27/2007   Erosive and ulcerative gastritis; small H.H.  . PARTIAL HYSTERECTOMY  1981   Bleeding, cyst, bladder tack  . RECTAL PROLAPSE REPAIR  2005  . TOTAL ABDOMINAL HYSTERECTOMY  2008   With a  bilateral salpingo- oophorectomy    Social History   Social History  . Marital status: Married    Spouse name: N/A  . Number of children: 2  . Years of education: N/A   Occupational History  . retired Astronomer [employer]   Social History Main Topics  . Smoking status: Never Smoker  . Smokeless tobacco: Never Used  . Alcohol use No  . Drug use: No  . Sexual activity: Yes    Birth control/ protection: Surgical, Post-menopausal   Other Topics Concern  . Not on file   Social History Narrative   Married   Lives with husband.  2 grown children   Daily caffeine use    Retired 2013 was [place]   Activity: walking   Diet: good water intake, fruits/vegetables daily    Review of Systems Heart: Denies CP  Lungs: Denies SOB All other ROS negative  Objective:   Physical Exam BP (!) 96/58   Pulse 72   Ht 5\' 4"  (1.626 m)   Wt 113 lb (51.3 kg)   BMI 19.40 kg/m  NAD HEENT: Oral mucosa moist, non-erythematous, eyes anicteric Heart: RRR, no murmurs, rubs, gallops Lungs: CTAB Abdomen: Hyperactive bowel sounds, soft, non-tender, non-distended. No masses Extremities: No lower extremity edema, cyanosis, clubbing Alert and oriented x 3 Psych: Normal mood an affect   Data reviewed - PCP note and labs (NL CBC, CMET, lipase) from yesterday

## 2016-10-11 NOTE — Patient Instructions (Addendum)
   You have been scheduled for an endoscopy. Please follow written instructions given to you at your visit today. If you use inhalers (even only as needed), please bring them with you on the day of your procedure. Your physician has requested that you go to www.startemmi.com and enter the access code given to you at your visit today. This web site gives a general overview about your procedure. However, you should still follow specific instructions given to you by our office regarding your preparation for the procedure.  Stay on your Eliquis for this procedure.  We have sent the following medications to your pharmacy for you to pick up at your convenience: Generic phenergan   I appreciate the opportunity to care for you. Silvano Rusk, MD, [facility]

## 2016-10-12 DIAGNOSIS — H35371 Puckering of macula, right eye: Secondary | ICD-10-CM | POA: Diagnosis not present

## 2016-10-12 DIAGNOSIS — H04123 Dry eye syndrome of bilateral lacrimal glands: Secondary | ICD-10-CM | POA: Diagnosis not present

## 2016-10-12 DIAGNOSIS — Z961 Presence of intraocular lens: Secondary | ICD-10-CM | POA: Diagnosis not present

## 2016-10-12 DIAGNOSIS — H43811 Vitreous degeneration, right eye: Secondary | ICD-10-CM | POA: Diagnosis not present

## 2016-10-13 ENCOUNTER — Encounter: Payer: Self-pay | Admitting: Family Medicine

## 2016-10-13 DIAGNOSIS — H43813 Vitreous degeneration, bilateral: Secondary | ICD-10-CM

## 2016-10-13 HISTORY — DX: Vitreous degeneration, bilateral: H43.813

## 2016-10-14 ENCOUNTER — Encounter: Payer: Self-pay | Admitting: *Deleted

## 2016-10-17 ENCOUNTER — Ambulatory Visit
Admission: RE | Admit: 2016-10-17 | Discharge: 2016-10-17 | Disposition: A | Discharge status: Home / Self Care | Payer: Medicare Other | Source: Ambulatory Visit | Attending: Family Medicine | Admitting: Family Medicine

## 2016-10-17 ENCOUNTER — Encounter: Payer: Self-pay | Admitting: *Deleted

## 2016-10-17 DIAGNOSIS — R112 Nausea with vomiting, unspecified: Secondary | ICD-10-CM

## 2016-10-17 DIAGNOSIS — K824 Cholesterolosis of gallbladder: Secondary | ICD-10-CM | POA: Diagnosis not present

## 2016-10-18 ENCOUNTER — Ambulatory Visit (AMBULATORY_SURGERY_CENTER): Payer: Medicare Other | Admitting: Internal Medicine

## 2016-10-18 ENCOUNTER — Encounter: Payer: Self-pay | Admitting: Internal Medicine

## 2016-10-18 VITALS — BP 118/48 | HR 63 | Temp 96.9°F | Resp 12 | Ht 64.0 in | Wt 113.0 lb

## 2016-10-18 DIAGNOSIS — I4891 Unspecified atrial fibrillation: Secondary | ICD-10-CM | POA: Diagnosis not present

## 2016-10-18 DIAGNOSIS — R634 Abnormal weight loss: Secondary | ICD-10-CM | POA: Diagnosis present

## 2016-10-18 DIAGNOSIS — I10 Essential (primary) hypertension: Secondary | ICD-10-CM | POA: Diagnosis not present

## 2016-10-18 DIAGNOSIS — R112 Nausea with vomiting, unspecified: Secondary | ICD-10-CM

## 2016-10-18 MED ORDER — SODIUM CHLORIDE 0.9 % IV SOLN: 500.0000 mL | INTRAVENOUS | Status: DC

## 2016-10-18 NOTE — Progress Notes (Signed)
TO PACU  Pt awake and alert. Report to RN 

## 2016-10-18 NOTE — Op Note (Signed)
[facility] Patient Name: Alexis Bentley Procedure Date: 10/18/2016 9:32 AM MRN: [id number] Endoscopist: Gatha Mayer , MD Age: 71 Referring MD:  Date of Birth: August 31, 1946 Gender: Female Account #: [id number] Procedure:                Upper GI endoscopy Indications:              Nausea with vomiting, Weight loss Medicines:                Propofol per Anesthesia, Monitored Anesthesia Care Procedure:                Pre-Anesthesia Assessment:                           - Prior to the procedure, a History and Physical                            was performed, and patient medications and                            allergies were reviewed. The patient's tolerance of                            previous anesthesia was also reviewed. The risks                            and benefits of the procedure and the sedation                            options and risks were discussed with the patient.                            All questions were answered, and informed consent                            was obtained. Prior Anticoagulants: The patient                            last took Eliquis (apixaban) on the day of the                            procedure. ASA Grade Assessment: II - A patient                            with mild systemic disease. After reviewing the                            risks and benefits, the patient was deemed in                            satisfactory condition to undergo the procedure.                           After obtaining informed consent, the endoscope was  passed under direct vision. Throughout the                            procedure, the patient's blood pressure, pulse, and                            oxygen saturations were monitored continuously. The                            Model GIF-[id number]) scope was introduced                            through the mouth, and advanced to the second part    of duodenum. The upper GI endoscopy was                            accomplished without difficulty. The patient                            tolerated the procedure well. Scope In: Scope Out: Findings:                 The esophagus was normal.                           The stomach was normal.                           The examined duodenum was normal.                           The cardia and gastric fundus were normal on                            retroflexion. Complications:            No immediate complications. Estimated Blood Loss:     Estimated blood loss: none. Impression:               - Normal esophagus.                           - Normal stomach.                           - Normal examined duodenum.                           - No specimens collected. Recommendation:           - Patient has a contact number available for                            emergencies. The signs and symptoms of potential                            delayed complications were discussed with the  patient. Return to normal activities tomorrow.                            Written discharge instructions were provided to the                            patient.                           - Resume previous diet.                           - Continue present medications.                           - [place] RUQ also ok as were labs                           Seems like sxs from stress                           would monitor and f/u PCP - plan for routine                            screening colonoscopy later this year - if more                            lower sxs could do sooner Gatha Mayer, MD 10/18/2016 9:54:14 AM This report has been signed electronically.

## 2016-10-18 NOTE — Patient Instructions (Addendum)
All looks ok - I think stress has been the source of problems. Let's see how you do - I would not do a colonoscopy right now - routine is due in a few months.  Please f/u Dr. Danise Mina.  I appreciate the opportunity to care for you. Gatha Mayer, MD, FACG    YOU HAD AN ENDOSCOPIC PROCEDURE TODAY AT [facility]:   Refer to the procedure report that was given to you for any specific questions about what was found during the examination.  If the procedure report does not answer your questions, please call your gastroenterologist to clarify.  If you requested that your care partner not be given the details of your procedure findings, then the procedure report has been included in a sealed envelope for you to review at your convenience later.  YOU SHOULD EXPECT: Some feelings of bloating in the abdomen. Passage of more gas than usual.  Walking can help get rid of the air that was put into your GI tract during the procedure and reduce the bloating. If you had a lower endoscopy (such as a colonoscopy or flexible sigmoidoscopy) you may notice spotting of blood in your stool or on the toilet paper. If you underwent a bowel prep for your procedure, you may not have a normal bowel movement for a few days.  Please Note:  You might notice some irritation and congestion in your nose or some drainage.  This is from the oxygen used during your procedure.  There is no need for concern and it should clear up in a day or so.  SYMPTOMS TO REPORT IMMEDIATELY:     Following upper endoscopy (EGD)  Vomiting of blood or coffee ground material  New chest pain or pain under the shoulder blades  Painful or persistently difficult swallowing  New shortness of breath  Fever of 100F or higher  Black, tarry-looking stools  For urgent or emergent issues, a gastroenterologist can be reached at any hour by calling [phone].   DIET:  We do recommend a small meal at first, but then you  may proceed to your regular diet.  Drink plenty of fluids but you should avoid alcoholic beverages for 24 hours.  ACTIVITY:  You should plan to take it easy for the rest of today and you should NOT DRIVE or use heavy machinery until tomorrow (because of the sedation medicines used during the test).    FOLLOW UP: Our staff will call the number listed on your records the next business day following your procedure to check on you and address any questions or concerns that you may have regarding the information given to you following your procedure. If we do not reach you, we will leave a message.  However, if you are feeling well and you are not experiencing any problems, there is no need to return our call.  We will assume that you have returned to your regular daily activities without incident.  If any biopsies were taken you will be contacted by phone or by letter within the next 1-3 weeks.  Please call us at [phone] if you have not heard about the biopsies in 3 weeks.    SIGNATURES/CONFIDENTIALITY: You and/or your care partner have signed paperwork which will be entered into your electronic medical record.  These signatures attest to the fact that that the information above on your After Visit Summary has been reviewed and is understood.  Full responsibility of the confidentiality of this  discharge information lies with you and/or your care-partner.   Resume medications.

## 2016-10-19 ENCOUNTER — Telehealth: Payer: Self-pay | Admitting: *Deleted

## 2016-10-19 NOTE — Telephone Encounter (Signed)
  Follow up Call-  Call back number 10/18/2016  Post procedure Call Back phone  # [phone]  Permission to leave phone message Yes  Some recent data might be hidden     Patient questions:  Do you have a fever, pain , or abdominal swelling? No. Pain Score  0 *  Have you tolerated food without any problems? Yes.    Have you been able to return to your normal activities? Yes.    Do you have any questions about your discharge instructions: Diet   No. Medications  No. Follow up visit  No.  Do you have questions or concerns about your Care? No.  Actions: * If pain score is 4 or above: No action needed, pain <4.

## 2016-10-20 ENCOUNTER — Encounter: Payer: Self-pay | Admitting: Family Medicine

## 2016-10-28 ENCOUNTER — Ambulatory Visit (INDEPENDENT_AMBULATORY_CARE_PROVIDER_SITE_OTHER): Payer: Medicare Other | Admitting: Internal Medicine

## 2016-10-28 ENCOUNTER — Other Ambulatory Visit: Payer: Self-pay | Admitting: Internal Medicine

## 2016-10-28 VITALS — BP 122/70 | HR 68 | Ht 65.0 in | Wt 111.0 lb

## 2016-10-28 DIAGNOSIS — I10 Essential (primary) hypertension: Secondary | ICD-10-CM | POA: Diagnosis not present

## 2016-10-28 DIAGNOSIS — I480 Paroxysmal atrial fibrillation: Secondary | ICD-10-CM | POA: Diagnosis not present

## 2016-10-28 NOTE — Patient Instructions (Signed)
Medication Instructions:  Your physician recommends that you continue on your current medications as directed. Please refer to the Current Medication list given to you today.    Labwork: None ordered   Testing/Procedures: None ordered'  Follow-Up: Your physician wants you to follow-up in: 12 months with Amber Seiler, NP. You will receive a reminder letter in the mail two months in advance. If you don't receive a letter, please call our office to schedule the follow-up appointment.   Any Other Special Instructions Will Be Listed Below (If Applicable).     If you need a refill on your cardiac medications before your next appointment, please call your pharmacy.   

## 2016-10-28 NOTE — Progress Notes (Signed)
PCP: Ria Bush, MD  Alexis Bentley is a 71 y.o. female who presents today for routine electrophysiology followup.  Since last being seen in our clinic, the patient reports doing very well.  She has had no afib.  PACs/PVCs seem better controlled.  Today, she denies symptoms of  chest pain, shortness of breath, lower extremity edema, dizziness, presyncope, or further syncope.   The patient is otherwise without complaint today.   Past Medical History:  Diagnosis Date  . Chest pain   . Erosive gastritis   . Gallbladder polyp 10/2016   by [place] 4.5mm  . GERD (gastroesophageal reflux disease)   . Hemorrhoids   . Hiatal hernia   . Hypertension   . Lumbar back pain    With radiculopathy  . Non-ST elevation myocardial infarction (NSTEMI) ([place]) 2010   likely type 2 - demand ischemia during afib  . Osteoporosis 01/07/2015   T-2.5 hip  . Paroxysmal atrial fibrillation ([place]) 2010   one episode  . Peptic ulcer disease   . PVD (posterior vitreous detachment), bilateral 10/13/2016   Followed by ophtho Dr Kathlen Mody @ [place]  . Rectal prolapse   . Syncope 03/2015   stable head CT, echo, carotids, labwork, EKG  . Transaminitis 2015   normal iron, viral hep panel, stable abd [place] (benign gallbladder polyp)   Past Surgical History:  Procedure Laterality Date  . CATARACT EXTRACTION Bilateral 2010  . COLONOSCOPY  12/03/2006   Internal/ external hemorrhoids, no polyps  . COLONOSCOPY WITH ESOPHAGOGASTRODUODENOSCOPY (EGD)  10/2016   WNL - sxs attributed to stress [name])  . CORONARY ANGIOPLASTY  08/07/2009   NML  . DEXA  03/2015   T -2.5 hip  . ESOPHAGOGASTRODUODENOSCOPY  04/27/2007   Erosive and ulcerative gastritis; small H.H.  . PARTIAL HYSTERECTOMY  1981   Bleeding, cyst, bladder tack  . RECTAL PROLAPSE REPAIR  2005  . TOTAL ABDOMINAL HYSTERECTOMY  2008   With a bilateral salpingo- oophorectomy    Current Outpatient Prescriptions  Medication Sig Dispense Refill  . Calcium Carbonate-Vitamin  D (CALTRATE 600+D) 600-400 MG-UNIT per tablet Take 1 tablet by mouth 2 (two) times daily.     Marland Kitchen ELIQUIS 5 MG TABS tablet TAKE 1 TABLET BY MOUTH TWICE DAILY 60 tablet 5  . hydrOXYzine (ATARAX/VISTARIL) 10 MG tablet Take 1 tablet (10 mg total) by mouth 2 (two) times daily as needed for anxiety. 30 tablet 0  . lisinopril (PRINIVIL,ZESTRIL) 20 MG tablet TAKE 1 TABLET BY MOUTH EVERY DAY 30 tablet 4  . metoprolol tartrate (LOPRESSOR) 25 MG tablet Take 1 tablet (25 mg total) by mouth 2 (two) times daily. 180 tablet 3  . omeprazole (PRILOSEC) 40 MG capsule TAKE 1 CAPSULE BY MOUTH EVERY DAY 90 capsule 2  . promethazine (PHENERGAN) 12.5 MG tablet Take 1 tablet (12.5 mg total) by mouth at bedtime as needed for nausea or vomiting. 30 tablet 0  . vitamin B-12 (CYANOCOBALAMIN) 500 MCG tablet Take 500 mcg by mouth every Monday, Wednesday, and Friday.     Current Facility-Administered Medications  Medication Dose Route Frequency Provider Last Rate Last Dose  . 0.9 %  sodium chloride infusion  500 mL Intravenous Continuous Gatha Mayer, MD       ROS- all systems are reviewed and negative except as per HPI above  Physical Exam: Vitals:   10/28/16 1601  BP: 122/70  Pulse: 68  Weight: 111 lb (50.3 kg)  Height: 5\' 5"  (1.651 m)    GEN- The patient  is well appearing, alert and oriented x 3 today.   Head- normocephalic, atraumatic Eyes-  Sclera clear, conjunctiva pink Ears- hearing intact Oropharynx- clear Lungs- Clear to ausculation bilaterally, normal work of breathing Heart- Regular rate and rhythm, no murmurs, rubs or gallops, PMI not laterally displaced GI- soft, NT, ND, + BS Extremities- no clubbing, cyanosis, or edema  ekg today reveals sinus rhythm 68 bpm, otherwise normal ekg  Assessment and Plan:  1. afib Well controlled  chads2vasc score is at least 3.  Continue eliquis. No changes  2. htn Stable No change required today  3. Pacs, pvcs She takes metoprolol 25mg  bid chronically.   No changes  Return in 1 year to see EP APP  Thompson Grayer MD, [facility] 10/28/2016 4:31 PM

## 2016-10-29 ENCOUNTER — Encounter: Payer: Self-pay | Admitting: Family Medicine

## 2016-11-01 ENCOUNTER — Other Ambulatory Visit: Payer: Self-pay | Admitting: Internal Medicine

## 2016-11-01 DIAGNOSIS — I480 Paroxysmal atrial fibrillation: Secondary | ICD-10-CM

## 2016-12-26 ENCOUNTER — Other Ambulatory Visit: Payer: Self-pay | Admitting: Internal Medicine

## 2016-12-26 NOTE — Telephone Encounter (Signed)
Received Eliquis 5mg  refill; pt was last seen by Dr. Rayann Heman on 10/28/16, Crea-0.90 on 10/10/16, Wt-50.3kg, age-71yrs old. Will send in refill as requested.

## 2017-02-13 ENCOUNTER — Other Ambulatory Visit: Payer: Self-pay | Admitting: Family Medicine

## 2017-02-13 NOTE — Telephone Encounter (Signed)
Last office visit 10/10/2016.  Last refilled 10/10/2016 for #30 with no refills.  Ok to refill?

## 2017-04-11 ENCOUNTER — Encounter: Payer: Self-pay | Admitting: Internal Medicine

## 2017-05-20 IMAGING — US US ABDOMEN LIMITED
1 series · 14 of 25 positions shown · non-contrast
Comparison: 03/31/2014

CLINICAL DATA: Intermittent nausea and vomiting since August 2016.

EXAM:
US ABDOMEN LIMITED - RIGHT UPPER QUADRANT

[Series 1: us abdomen limited · 0.17mm/px · 14 of 36 slices shown]
[im 1/36]
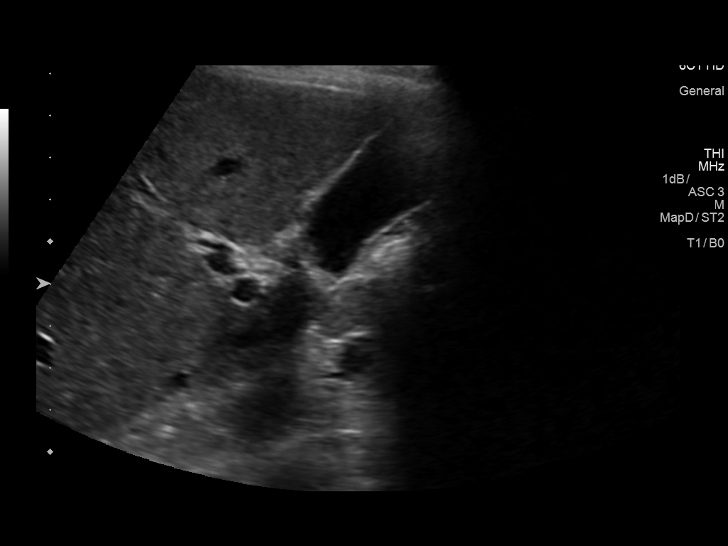
[im 3/36]
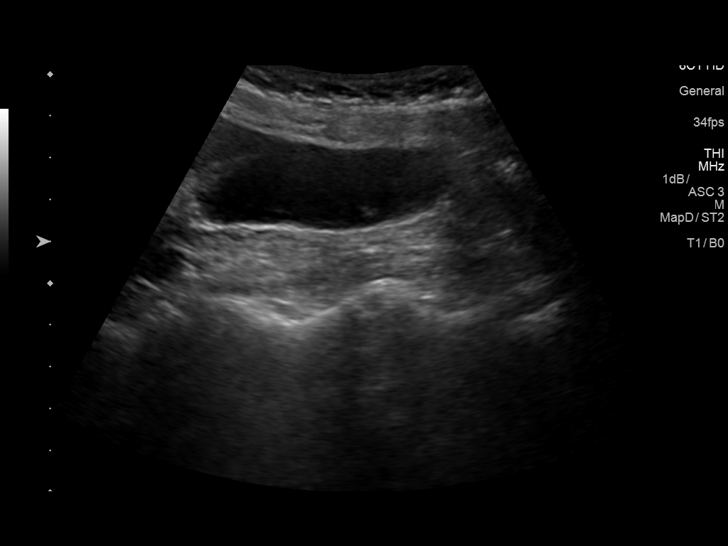
[im 6/36]
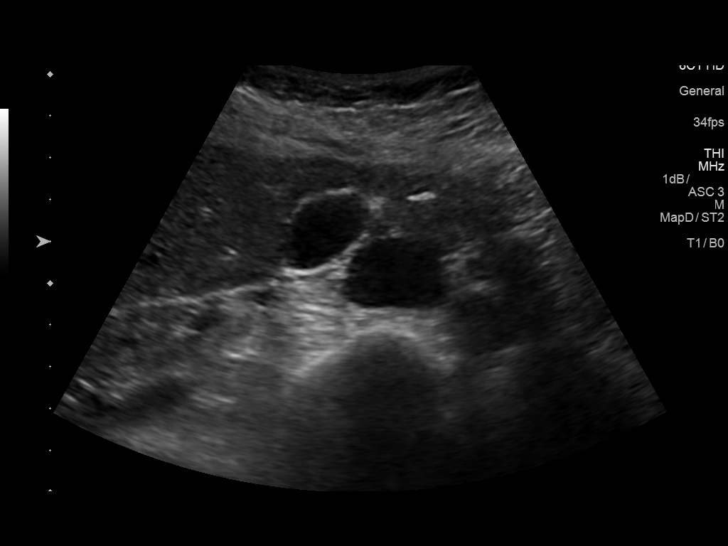
[im 9/36]
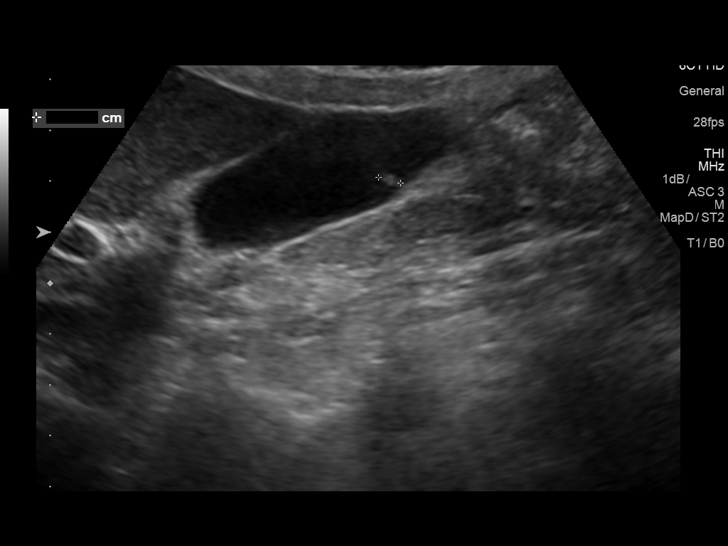
[im 12/36]
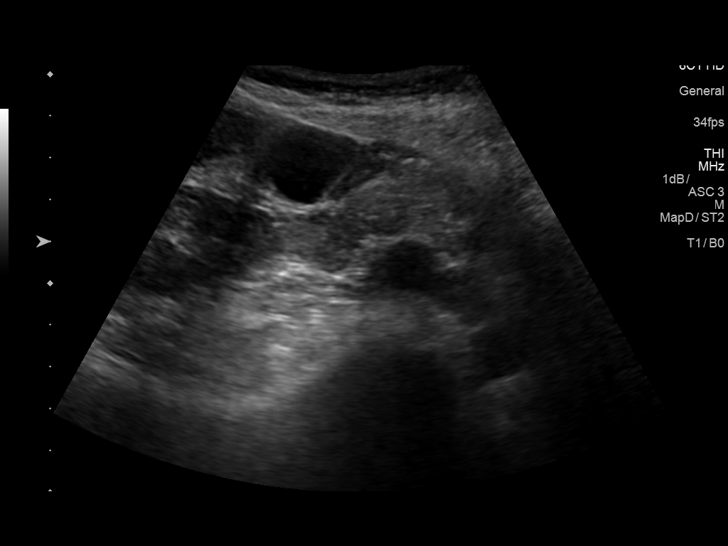
[im 14/36]
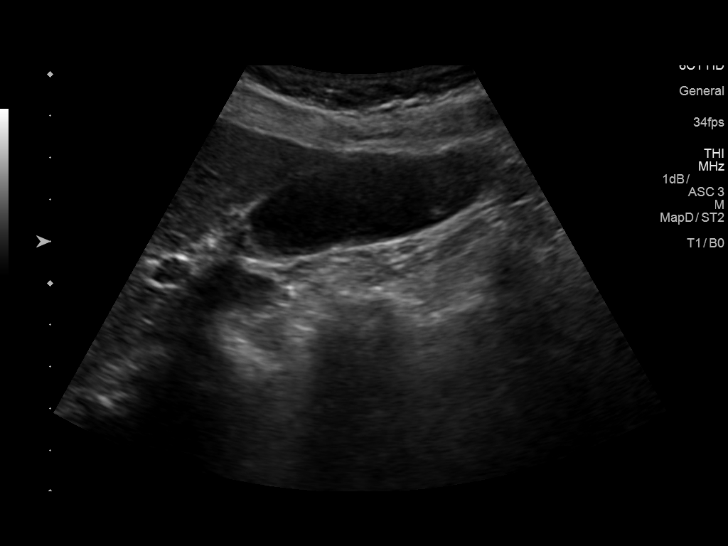
[im 17/36]
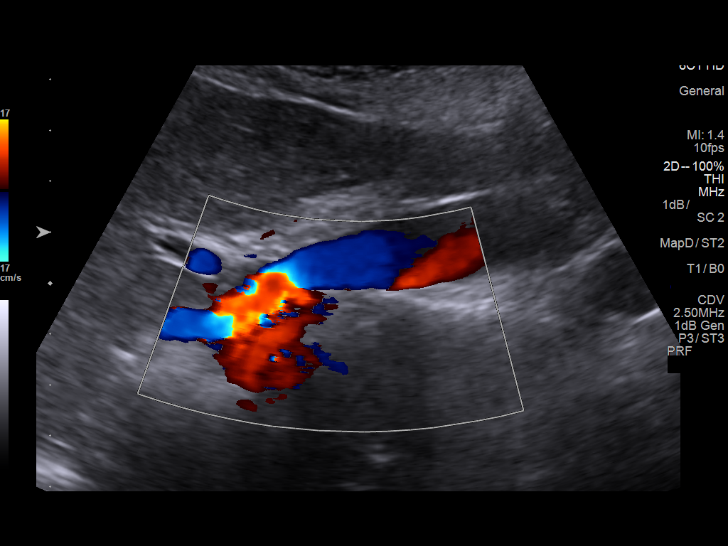
[im 19/36]
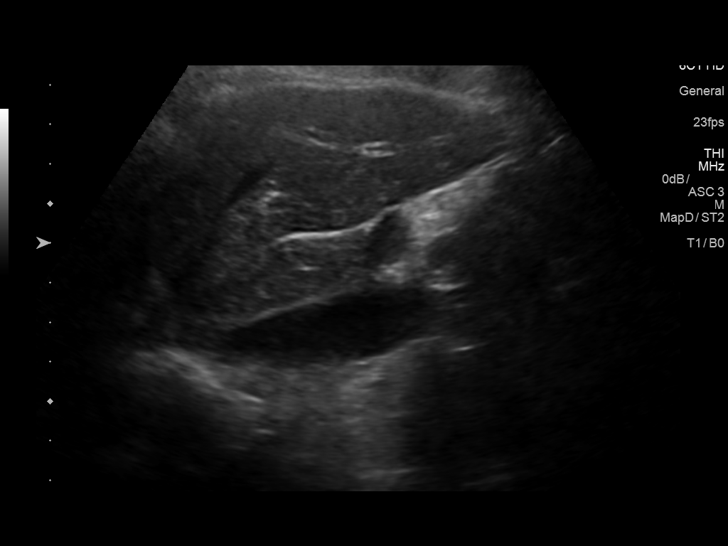
[im 22/36]
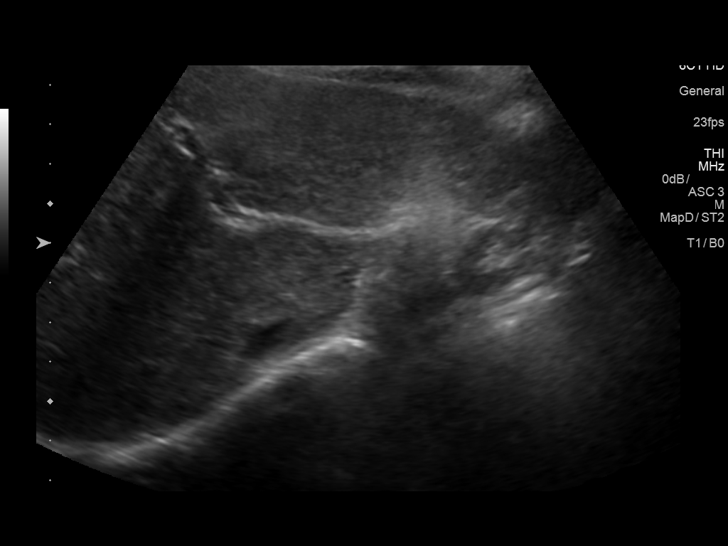
[im 24/36]
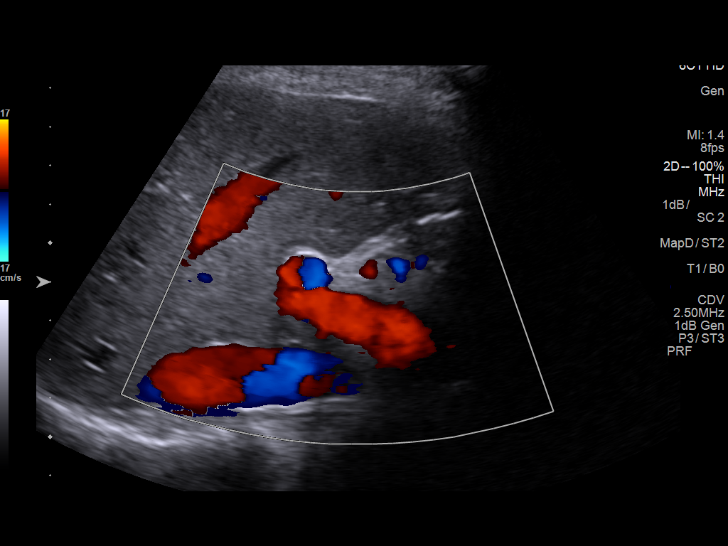
[im 27/36]
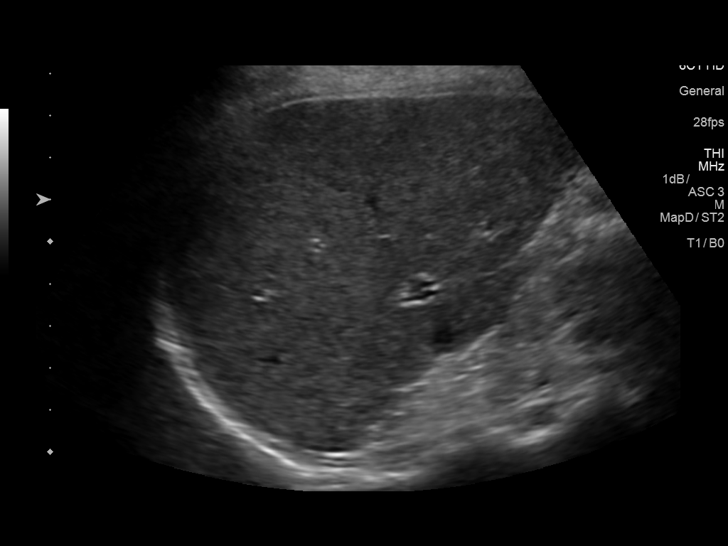
[im 30/36]
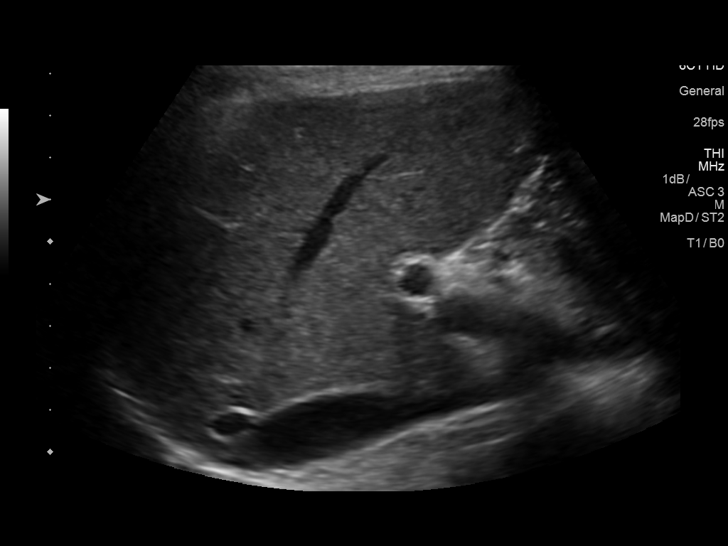
[im 33/36]
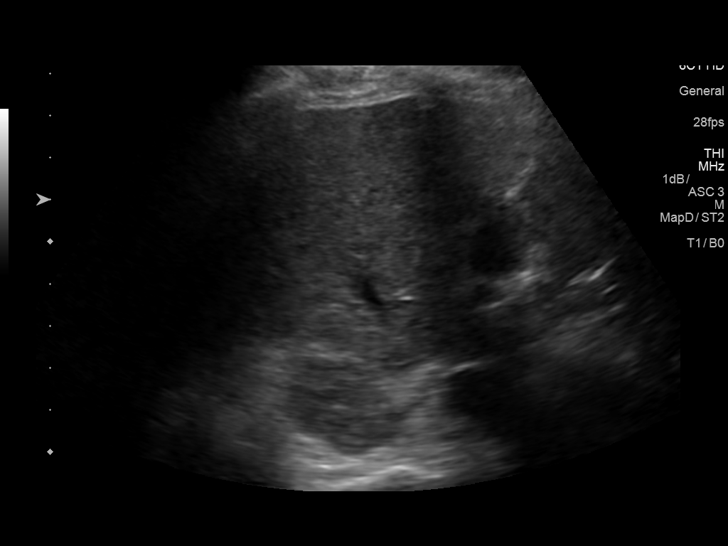
[im 36/36]
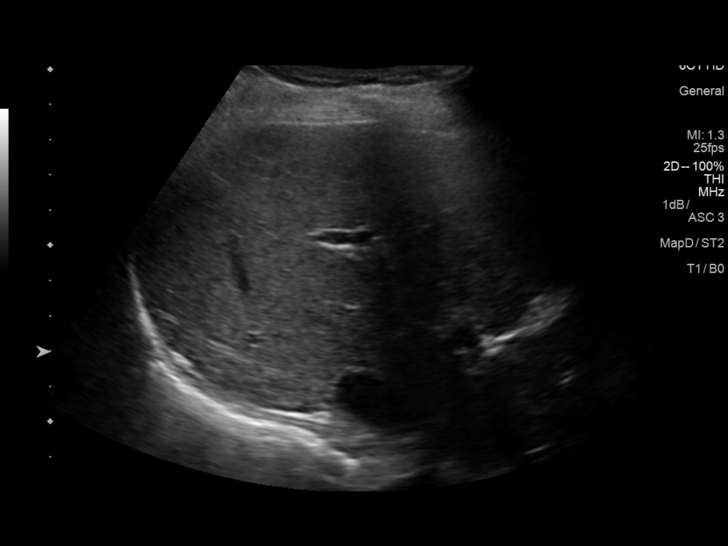

[14 of 25 positions shown; findings below may reference images not displayed]

FINDINGS: Gallbladder:

No gallstones or wall thickening visualized. No sonographic Murphy
sign noted by sonographer. 4.5 mm polyp without significant change.

Common bile duct:

Diameter: 2.1 mm.

Liver:

No focal lesion identified. Within normal limits in parenchymal
echogenicity.
IMPRESSION: No acute hepatobiliary disease.

Stable 4.5 mm gallbladder polyp.

## 2017-05-24 ENCOUNTER — Other Ambulatory Visit: Payer: Self-pay | Admitting: Family Medicine

## 2017-06-27 ENCOUNTER — Telehealth: Payer: Self-pay | Admitting: Internal Medicine

## 2017-06-27 NOTE — Telephone Encounter (Signed)
Patient calling the office for samples of medication:   1.  What medication and dosage are you requesting samples for? eliquis 5mg   2.  Are you currently out of this medication? Week supply left

## 2017-06-27 NOTE — Telephone Encounter (Signed)
Spoke with patient and made her aware that I would place two weeks supply at the front desk. I also informed her that we would not be able to supply her with samples for the remainder of the year. I made her aware that if she cannot afford the medication she can apply for patient assistance. She is going to see if she can afford to refill the medication and then call us back if she needs to apply for patient assistance or see if the physician can switch her to something more affordable.

## 2017-07-13 ENCOUNTER — Ambulatory Visit (INDEPENDENT_AMBULATORY_CARE_PROVIDER_SITE_OTHER): Payer: Medicare Other

## 2017-07-13 DIAGNOSIS — Z23 Encounter for immunization: Secondary | ICD-10-CM

## 2017-07-20 ENCOUNTER — Other Ambulatory Visit: Payer: Self-pay | Admitting: Internal Medicine

## 2017-07-20 NOTE — Telephone Encounter (Signed)
Eliquis 5mg  refill request received; pt is 71 yrs old, Wt-50.3kg, Crea-0.90 on 10/10/16, last seen by Dr. Rayann Heman on 10/28/16. Will send in refill request to requested Pharmacy.

## 2017-08-14 ENCOUNTER — Telehealth: Payer: Self-pay | Admitting: Family Medicine

## 2017-10-13 DIAGNOSIS — H43813 Vitreous degeneration, bilateral: Secondary | ICD-10-CM | POA: Diagnosis not present

## 2017-10-13 DIAGNOSIS — H04123 Dry eye syndrome of bilateral lacrimal glands: Secondary | ICD-10-CM | POA: Diagnosis not present

## 2017-10-13 DIAGNOSIS — H35371 Puckering of macula, right eye: Secondary | ICD-10-CM | POA: Diagnosis not present

## 2017-10-13 DIAGNOSIS — Z961 Presence of intraocular lens: Secondary | ICD-10-CM | POA: Diagnosis not present

## 2017-10-13 DIAGNOSIS — H26491 Other secondary cataract, right eye: Secondary | ICD-10-CM | POA: Diagnosis not present

## 2017-10-31 ENCOUNTER — Other Ambulatory Visit: Payer: Self-pay

## 2017-10-31 DIAGNOSIS — I480 Paroxysmal atrial fibrillation: Secondary | ICD-10-CM

## 2017-10-31 MED ORDER — METOPROLOL TARTRATE 25 MG PO TABS: ORAL_TABLET | ORAL | 0 refills | Status: DC

## 2017-11-03 NOTE — Telephone Encounter (Signed)
Spoke with pt about rx request.  Says she has an appt Wed, 11/08/17 so she will wait until then to request refills.

## 2017-11-03 NOTE — Telephone Encounter (Signed)
Pt called to f/u on med request - advised no appt since 10/2016 - pt is scheduled for 11/08/17

## 2017-11-08 ENCOUNTER — Ambulatory Visit (INDEPENDENT_AMBULATORY_CARE_PROVIDER_SITE_OTHER): Payer: Medicare Other

## 2017-11-08 ENCOUNTER — Encounter: Payer: Self-pay | Admitting: Family Medicine

## 2017-11-08 ENCOUNTER — Ambulatory Visit (INDEPENDENT_AMBULATORY_CARE_PROVIDER_SITE_OTHER): Payer: Medicare Other | Admitting: Family Medicine

## 2017-11-08 VITALS — BP 118/70 | HR 64 | Temp 97.8°F | Ht 64.0 in | Wt 117.2 lb

## 2017-11-08 DIAGNOSIS — I10 Essential (primary) hypertension: Secondary | ICD-10-CM

## 2017-11-08 DIAGNOSIS — I4891 Unspecified atrial fibrillation: Secondary | ICD-10-CM

## 2017-11-08 DIAGNOSIS — M810 Age-related osteoporosis without current pathological fracture: Secondary | ICD-10-CM

## 2017-11-08 DIAGNOSIS — Z1231 Encounter for screening mammogram for malignant neoplasm of breast: Secondary | ICD-10-CM | POA: Diagnosis not present

## 2017-11-08 DIAGNOSIS — E785 Hyperlipidemia, unspecified: Secondary | ICD-10-CM | POA: Diagnosis not present

## 2017-11-08 DIAGNOSIS — Z1239 Encounter for other screening for malignant neoplasm of breast: Secondary | ICD-10-CM

## 2017-11-08 DIAGNOSIS — K219 Gastro-esophageal reflux disease without esophagitis: Secondary | ICD-10-CM | POA: Diagnosis not present

## 2017-11-08 DIAGNOSIS — Z0000 Encounter for general adult medical examination without abnormal findings: Secondary | ICD-10-CM

## 2017-11-08 MED ORDER — OMEPRAZOLE 40 MG PO CPDR: DELAYED_RELEASE_CAPSULE | ORAL | 3 refills | Status: DC

## 2017-11-08 NOTE — Progress Notes (Signed)
BP 118/70 (BP Location: Right Arm, Patient Position: Sitting, Cuff Size: Normal)   Pulse 64   Temp 97.8 F (36.6 C) (Oral)   Ht 5\' 4"  (1.626 m) Comment: no shoes  Wt 117 lb 4 oz (53.2 kg)   SpO2 97%   BMI 20.13 kg/m    CC: AMW f/u visit Subjective:    Patient ID: Alexis Bentley, female    DOB: 09/10/1946, 72 y.o.   MRN: [id number]  HPI: Alexis Bentley is a 72 y.o. female presenting on 11/08/2017 for Annual Exam (Pt 2.)   [name] today for medicare wellness visit. Discussed with Alexis Bentley. Note will reviewed.   Sees EP cardiology yearly - on eliquis.  EGD WNL 2018.   Preventative: COLONOSCOPY 12/03/2006; Internal/ external hemorrhoids, no polyps. Upcoming f/u planned with [facility].  Well woman exam - with OBGYN Dr. Vernie Ammons 08/[facility] He retired. S/p complete hysterectomy. S/p rectal prolapse surgery 2005.  Breast cancer screening - does breast exams at home without concerns. Mammogram 08/2015 - will order today DEXA - 03/2015. Declined bisphosphonate. Overdue for repeat. Reviewed cal/vit D intake, encouraged regular walking.  Flu yearly prevnar 2016, pneumovax 2017 Td 2009 zostavax - to check with insurance  Advanced directive: packet provided last week. Pt will work on this. Seat Bentley use discussed Sunscreen use discussed. No changing moles on skin. Non smoker Alcohol - none  Married Lives with husband. 2 grown children Daily caffeine use Retired 2013 was [employer]  Activity: walking - joined [employer] with silver sneakers  Diet: good water intake, fruits/vegetables daily   Relevant past medical, surgical, family and social history reviewed and updated as indicated. Interim medical history since our last visit reviewed. Allergies and medications reviewed and updated. Outpatient Medications Prior to Visit  Medication Sig Dispense Refill  . Calcium Carbonate-Vitamin D (CALTRATE 600+D) 600-400 MG-UNIT per tablet Take 1 tablet by mouth 2 (two) times  daily.     Marland Kitchen ELIQUIS 5 MG TABS tablet TAKE 1 TABLET BY MOUTH TWICE DAILY 60 tablet 3  . hydrOXYzine (ATARAX/VISTARIL) 10 MG tablet TAKE 1 TABLET(10 MG) BY MOUTH TWICE DAILY AS NEEDED FOR ANXIETY 30 tablet 0  . lisinopril (PRINIVIL,ZESTRIL) 20 MG tablet TAKE 1 TABLET BY MOUTH EVERY DAY 30 tablet 11  . metoprolol tartrate (LOPRESSOR) 25 MG tablet TAKE 1 TABLET(25 MG) BY MOUTH TWICE DAILY 60 tablet 0  . promethazine (PHENERGAN) 12.5 MG tablet Take 1 tablet (12.5 mg total) by mouth at bedtime as needed for nausea or vomiting. 30 tablet 0  . vitamin B-12 (CYANOCOBALAMIN) 500 MCG tablet Take 500 mcg by mouth every Monday, Wednesday, and Friday.    Marland Kitchen omeprazole (PRILOSEC) 40 MG capsule TAKE 1 CAPSULE BY MOUTH EVERY DAY 90 capsule 0   Facility-Administered Medications Prior to Visit  Medication Dose Route Frequency Provider Last Rate Last Dose  . 0.9 %  sodium chloride infusion  500 mL Intravenous Continuous Gatha Mayer, MD         Per HPI unless specifically indicated in ROS section below Review of Systems     Objective:    BP 118/70 (BP Location: Right Arm, Patient Position: Sitting, Cuff Size: Normal)   Pulse 64   Temp 97.8 F (36.6 C) (Oral)   Ht 5\' 4"  (1.626 m) Comment: no shoes  Wt 117 lb 4 oz (53.2 kg)   SpO2 97%   BMI 20.13 kg/m   Wt Readings from Last 3 Encounters:  11/08/17 117 lb 4 oz (  53.2 kg)  11/08/17 117 lb 4 oz (53.2 kg)  10/28/16 111 lb (50.3 kg)    Physical Exam  Constitutional: She is oriented to person, place, and time. She appears well-developed and well-nourished. No distress.  HENT:  Head: Normocephalic and atraumatic.  Right Ear: Hearing, tympanic membrane, external ear and ear canal normal.  Left Ear: Hearing, tympanic membrane, external ear and ear canal normal.  Nose: Nose normal.  Mouth/Throat: Uvula is midline, oropharynx is clear and moist and mucous membranes are normal. No oropharyngeal exudate, posterior oropharyngeal edema or posterior  oropharyngeal erythema.  Eyes: Conjunctivae and EOM are normal. Pupils are equal, round, and reactive to light. No scleral icterus.  Neck: Normal range of motion. Neck supple. Carotid bruit is not present. No thyromegaly present.  Cardiovascular: Normal rate, regular rhythm, normal heart sounds and intact distal pulses.  No murmur heard. Pulses:      Radial pulses are 2+ on the right side, and 2+ on the left side.  Pulmonary/Chest: Effort normal and breath sounds normal. No respiratory distress. She has no wheezes. She has no rales.  Abdominal: Soft. Bowel sounds are normal. She exhibits no distension and no mass. There is no tenderness. There is no rebound and no guarding.  Musculoskeletal: Normal range of motion. She exhibits no edema.  Lymphadenopathy:    She has no cervical adenopathy.  Neurological: She is alert and oriented to person, place, and time.  CN grossly intact, station and gait intact  Skin: Skin is warm and dry. No rash noted.  Psychiatric: She has a normal mood and affect. Her behavior is normal. Judgment and thought content normal.  Nursing note and vitals reviewed.  Results for orders placed or performed in visit on 10/10/16  Comprehensive metabolic panel  Result Value Ref Range   Sodium 139 135 - 145 mEq/L   Potassium 4.5 3.5 - 5.1 mEq/L   Chloride 102 96 - 112 mEq/L   CO2 28 19 - 32 mEq/L   Glucose, Bld 84 70 - 99 mg/dL   BUN 21 6 - 23 mg/dL   Creatinine, Ser 0.90 0.40 - 1.20 mg/dL   Total Bilirubin 0.4 0.2 - 1.2 mg/dL   Alkaline Phosphatase 84 39 - 117 U/L   AST 25 0 - 37 U/L   ALT 28 0 - 35 U/L   Total Protein 8.1 6.0 - 8.3 g/dL   Albumin 4.5 3.5 - 5.2 g/dL   Calcium 10.2 8.4 - 10.5 mg/dL   GFR 66.09 >60.00 mL/min  CBC with Differential/Platelet  Result Value Ref Range   WBC 7.8 4.0 - 10.5 K/uL   RBC 4.26 3.87 - 5.11 Mil/uL   Hemoglobin 13.2 12.0 - 15.0 g/dL   HCT 38.8 36.0 - 46.0 %   MCV 91.1 78.0 - 100.0 fl   MCHC 34.1 30.0 - 36.0 g/dL   RDW 12.9  11.5 - 15.5 %   Platelets 264.0 150.0 - 400.0 K/uL   Neutrophils Relative % 71.5 43.0 - 77.0 %   Lymphocytes Relative 21.2 12.0 - 46.0 %   Monocytes Relative 5.8 3.0 - 12.0 %   Eosinophils Relative 0.9 0.0 - 5.0 %   Basophils Relative 0.6 0.0 - 3.0 %   Neutro Abs 5.6 1.4 - 7.7 K/uL   Lymphs Abs 1.7 0.7 - 4.0 K/uL   Monocytes Absolute 0.5 0.1 - 1.0 K/uL   Eosinophils Absolute 0.1 0.0 - 0.7 K/uL   Basophils Absolute 0.0 0.0 - 0.1 K/uL  Lipase  Result  Value Ref Range   Lipase 40.0 11.0 - 59.0 U/L   Lab Results  Component Value Date   CHOL 180 07/08/2016   HDL 81.80 07/08/2016   LDLCALC 85 07/08/2016   LDLDIRECT 90.2 12/01/2011   TRIG 62.0 07/08/2016   CHOLHDL 2 07/08/2016    Lab Results  Component Value Date   TSH 1.55 01/07/2015    Lab Results  Component Value Date   VITAMINB12 >1500 (H) 07/08/2016       Assessment & Plan:   Problem List Items Addressed This Visit    Essential hypertension    Chronic, stable. Continue current regimen. This is filled by EP      GERD    Chronic, stable. Continue daily PPI. H/o PUD, erosive gastritis, HH.      Relevant Medications   omeprazole (PRILOSEC) 40 MG capsule   HLD (hyperlipidemia)    Chronic, stable off medication.       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 On beta blocker and eliquis.      Osteoporosis - Primary    Update DEXA. Previously declined medication. Reviewed calcium and vit D dosing. Reviewed importance of regular weight bearing exercises. She has started going to gym regularly.      Relevant Orders   DG Bone Density   Rectal prolapse    Other Visit Diagnoses    Breast cancer screening       Relevant Orders   MM Digital Screening       Follow up plan: Return in about 1 year (around 11/08/2018) for medicare wellness visit, follow up visit.  Ria Bush, MD

## 2017-11-08 NOTE — Progress Notes (Signed)
Subjective:   Alexis Bentley is a 72 y.o. female who presents for Medicare Annual (Subsequent) preventive examination.  Review of Systems:  N/A Cardiac Risk Factors include: advanced age (>36men, >31 women);dyslipidemia;hypertension     Objective:     Vitals: BP 118/70 (BP Location: Right Arm, Patient Position: Sitting, Cuff Size: Normal)   Pulse 64   Temp 97.8 F (36.6 C) (Oral)   Ht 5\' 4"  (1.626 m) Comment: no shoes  Wt 117 lb 4 oz (53.2 kg)   SpO2 97%   BMI 20.13 kg/m   Body mass index is 20.13 kg/m.  Advanced Directives 11/08/2017 07/08/2016  Does Patient Have a Medical Advance Directive? No No  Would patient like information on creating a medical advance directive? No - Patient declined Yes - Scientist, clinical (histocompatibility and immunogenetics) given    Tobacco Social History   Tobacco Use  Smoking Status Never Smoker  Smokeless Tobacco Never Used     Counseling given: No   Clinical Intake:  Pre-visit preparation completed: Yes  Pain : No/denies pain Pain Score: 0-No pain     Nutritional Status: BMI of 19-24  Normal Nutritional Risks: None Diabetes: No  How often do you need to have someone help you when you read instructions, pamphlets, or other written materials from your doctor or pharmacy?: 1 - Never What is the last grade level you completed in school?: 12th grade  Interpreter Needed?: No  Comments: pt lives with spouse Information entered by :: LPinson, LPN  Past Medical History:  Diagnosis Date  . Chest pain   . Erosive gastritis   . Gallbladder polyp 10/2016   by [place] 4.41mm  . GERD (gastroesophageal reflux disease)   . Hemorrhoids   . Hiatal hernia   . Hypertension   . Lumbar back pain    With radiculopathy  . Non-ST elevation myocardial infarction (NSTEMI) ([place]) 2010   likely type 2 - demand ischemia during afib  . Osteoporosis 01/07/2015   T-2.5 hip  . Paroxysmal atrial fibrillation ([place]) 2010   one episode  . Peptic ulcer disease   . PVD (posterior vitreous  detachment), bilateral 10/13/2016   Followed by ophtho Dr Kathlen Mody @ [facility]  . Rectal prolapse   . Syncope 03/2015   stable head CT, echo, carotids, labwork, EKG  . Transaminitis 2015   normal iron, viral hep panel, stable abd [place] (benign gallbladder polyp)   Past Surgical History:  Procedure Laterality Date  . CATARACT EXTRACTION Bilateral 2010  . COLONOSCOPY  12/03/2006   Internal/ external hemorrhoids, no polyps  . CORONARY ANGIOPLASTY  08/07/2009   NML  . DEXA  03/2015   T -2.5 hip  . ESOPHAGOGASTRODUODENOSCOPY  04/27/2007   Erosive and ulcerative gastritis; small H.H.  . ESOPHAGOGASTRODUODENOSCOPY  10/2016   WNL - sxs attributed to stress [name])  . PARTIAL HYSTERECTOMY  1981   Bleeding, cyst, bladder tack  . RECTAL PROLAPSE REPAIR  2005  . TOTAL ABDOMINAL HYSTERECTOMY  2008   With a bilateral salpingo- oophorectomy   Family History  Problem Relation Age of Onset  . Hypertension Mother   . COPD Mother        Prior smoker  . Coronary artery disease Mother        stents  . Heart attack Father   . Hypertension Father   . Stroke Father        Ministrokes  . Coronary artery disease Father        x4 stents, Pacer  . Colon  cancer Father   . Alzheimer's disease Father   . Heart disease Sister   . Testicular cancer Brother   . Prostate cancer Brother   . Diabetes Mellitus I Son   . Stomach cancer Neg Hx   . Rectal cancer Neg Hx   . Esophageal cancer Neg Hx   . Liver cancer Neg Hx    Social History   Socioeconomic History  . Marital status: Married    Spouse name: None  . Number of children: 2  . Years of education: None  . Highest education level: None  Social Needs  . Financial resource strain: None  . Food insecurity - worry: None  . Food insecurity - inability: None  . Transportation needs - medical: None  . Transportation needs - non-medical: None  Occupational History  . Occupation: retired    Fish farm manager: [employer]  Tobacco Use  . Smoking  status: Never Smoker  . Smokeless tobacco: Never Used  Substance and Sexual Activity  . Alcohol use: No    Alcohol/week: 0.0 oz  . Drug use: No  . Sexual activity: Yes    Birth control/protection: Surgical, Post-menopausal  Other Topics Concern  . None  Social History Narrative   Married   Lives with husband.  2 grown children   Daily caffeine use   Retired 2013 was [place]   Activity: walking   Diet: good water intake, fruits/vegetables daily    Outpatient Encounter Medications as of 11/08/2017  Medication Sig  . Calcium Carbonate-Vitamin D (CALTRATE 600+D) 600-400 MG-UNIT per tablet Take 1 tablet by mouth 2 (two) times daily.   Marland Kitchen ELIQUIS 5 MG TABS tablet TAKE 1 TABLET BY MOUTH TWICE DAILY  . hydrOXYzine (ATARAX/VISTARIL) 10 MG tablet TAKE 1 TABLET(10 MG) BY MOUTH TWICE DAILY AS NEEDED FOR ANXIETY  . lisinopril (PRINIVIL,ZESTRIL) 20 MG tablet TAKE 1 TABLET BY MOUTH EVERY DAY  . metoprolol tartrate (LOPRESSOR) 25 MG tablet TAKE 1 TABLET(25 MG) BY MOUTH TWICE DAILY  . promethazine (PHENERGAN) 12.5 MG tablet Take 1 tablet (12.5 mg total) by mouth at bedtime as needed for nausea or vomiting.  . vitamin B-12 (CYANOCOBALAMIN) 500 MCG tablet Take 500 mcg by mouth every Monday, Wednesday, and Friday.  . [DISCONTINUED] ELIQUIS 5 MG TABS tablet TAKE 1 TABLET BY MOUTH TWICE DAILY  . [DISCONTINUED] lisinopril (PRINIVIL,ZESTRIL) 20 MG tablet TAKE 1 TABLET BY MOUTH EVERY DAY  . [DISCONTINUED] omeprazole (PRILOSEC) 40 MG capsule TAKE 1 CAPSULE BY MOUTH EVERY DAY   Facility-Administered Encounter Medications as of 11/08/2017  Medication  . 0.9 %  sodium chloride infusion    Activities of Daily Living In your present state of health, do you have any difficulty performing the following activities: 11/08/2017  Hearing? N  Vision? N  Difficulty concentrating or making decisions? N  Walking or climbing stairs? N  Dressing or bathing? N  Doing errands, shopping? N  Preparing Food  and eating ? N  Using the Toilet? N  In the past six months, have you accidently leaked urine? Y  Do you have problems with loss of bowel control? N  Managing your Medications? N  Managing your Finances? N  Housekeeping or managing your Housekeeping? N  Some recent data might be hidden    Patient Care Team: Ria Bush, MD as PCP - General (Family Medicine) Gatha Mayer, MD as Consulting Physician (Gastroenterology) Thompson Grayer, MD as Consulting Physician (Cardiology) Monna Fam, MD as Consulting Physician (Ophthalmology)    Assessment:  This is a routine wellness examination for [facility].   Hearing Screening   125Hz  250Hz  500Hz  1000Hz  2000Hz  3000Hz  4000Hz  6000Hz  8000Hz   Right ear:   40 40 40  40    Left ear:   40 40 40  40    Vision Screening Comments: Last vision exam in Jan 2019 with Dr. Herbert Deaner    Exercise Activities and Dietary recommendations Current Exercise Habits: Structured exercise class, Time (Minutes): 60, Frequency (Times/Week): 3, Weekly Exercise (Minutes/Week): 180, Intensity: Moderate, Exercise limited by: None identified  Goals    . Increase physical activity     Starting 11/08/2017, I will continue to exercise for at least 60 minutes 3 days per week.        Fall Risk Fall Risk  11/08/2017 07/08/2016 05/19/2015  Falls in the past year? No No No   Depression Screen PHQ 2/9 Scores 11/08/2017 07/08/2016 05/19/2015  PHQ - 2 Score 0 0 0  PHQ- 9 Score 0 - -     Cognitive Function MMSE - Mini Mental State Exam 11/08/2017 07/08/2016  Orientation to time 5 5  Orientation to Place 5 5  Registration 3 3  Attention/ Calculation 0 0  Recall 3 3  Language- name 2 objects 0 0  Language- repeat 1 1  Language- follow 3 step command 3 3  Language- read & follow direction 0 0  Write a sentence 0 0  Copy design 0 0  Total score 20 20     PLEASE NOTE: A Mini-Cog screen was completed. Maximum score is 20. A value of 0 denotes this part of Folstein MMSE was not  completed or the patient failed this part of the Mini-Cog screening.   Mini-Cog Screening Orientation to Time - Max 5 pts Orientation to Place - Max 5 pts Registration - Max 3 pts Recall - Max 3 pts Language Repeat - Max 1 pts Language Follow 3 Step Command - Max 3 pts     Immunization History  Administered Date(s) Administered  . Influenza,inj,Quad PF,6+ Mos 07/10/2014, 07/02/2015, 07/08/2016, 07/13/2017  . Influenza-Unspecified 07/03/2013  . Pneumococcal Conjugate-13 05/19/2015  . Pneumococcal Polysaccharide-23 07/08/2016  . Td 10/03/1993, 06/10/2008     Screening Tests Health Maintenance  Topic Date Due  . DTaP/Tdap/Td (1 - Tdap) 06/10/2018 (Originally 06/11/2008)  . MAMMOGRAM  10/02/2018 (Originally 09/01/2017)  . TETANUS/TDAP  06/10/2018  . COLONOSCOPY  03/08/2021  . INFLUENZA VACCINE  Completed  . DEXA SCAN  Completed  . Hepatitis C Screening  Completed  . PNA vac Low Risk Adult  Completed       Plan:     I have personally reviewed, addressed, and noted the following in the patient's chart:  A. Medical and social history B. Use of alcohol, tobacco or illicit drugs  C. Current medications and supplements D. Functional ability and status E.  Nutritional status F.  Physical activity G. Advance directives H. List of other physicians I.  Hospitalizations, surgeries, and ER visits in previous 12 months J.  Ewa Gentry to include hearing, vision, cognitive, depression L. Referrals and appointments - none  In addition, I have reviewed and discussed with patient certain preventive protocols, quality metrics, and best practice recommendations. A written personalized care plan for preventive services as well as general preventive health recommendations were provided to patient.  See attached scanned questionnaire for additional information.   Signed,   Lindell Noe, MHA, BS, LPN Health Coach

## 2017-11-08 NOTE — Assessment & Plan Note (Signed)
Chronic, stable off medication.  

## 2017-11-08 NOTE — Progress Notes (Signed)
PCP notes:   Health maintenance:  Mammogram - PCP please address at next appt; pt has requested a referral  Abnormal screenings:   None  Patient concerns:   Pt has requested a refill for Omeprazole.  Nurse concerns:  None  Next PCP appt:   11/08/2017 @ 1200

## 2017-11-08 NOTE — Assessment & Plan Note (Signed)
Chronic, stable. Continue daily PPI. H/o PUD, erosive gastritis, HH.

## 2017-11-08 NOTE — Assessment & Plan Note (Signed)
Followed by EP. On beta blocker and eliquis.

## 2017-11-08 NOTE — Patient Instructions (Signed)
 Preventive Care for Adults  A healthy lifestyle and preventive care can promote health and wellness. Preventive health guidelines for adults include the following key practices.  . A routine yearly physical is a good way to check with your health care provider about your health and preventive screening. It is a chance to share any concerns and updates on your health and to receive a thorough exam.  . Visit your dentist for a routine exam and preventive care every 6 months. Brush your teeth twice a day and floss once a day. Good oral hygiene prevents tooth decay and gum disease.  . The frequency of eye exams is based on your age, health, family medical history, use  of contact lenses, and other factors. Follow your health care provider's recommendations for frequency of eye exams.  . Eat a healthy diet. Foods like vegetables, fruits, whole grains, low-fat dairy products, and lean protein foods contain the nutrients you need without too many calories. Decrease your intake of foods high in solid fats, added sugars, and salt. Eat the right amount of calories for you. Get information about a proper diet from your health care provider, if necessary.  . Regular physical exercise is one of the most important things you can do for your health. Most adults should get at least 150 minutes of moderate-intensity exercise (any activity that increases your heart rate and causes you to sweat) each week. In addition, most adults need muscle-strengthening exercises on 2 or more days a week.  Silver Sneakers may be a benefit available to you. To determine eligibility, you may visit the website: www.silversneakers.com or contact program at 1-866-584-7389 Mon-Fri between 8AM-8PM.   . Maintain a healthy weight. The body mass index (BMI) is a screening tool to identify possible weight problems. It provides an estimate of body fat based on height and weight. Your health care provider can find your BMI and can help you  achieve or maintain a healthy weight.   For adults 20 years and older: ? A BMI below 18.5 is considered underweight. ? A BMI of 18.5 to 24.9 is normal. ? A BMI of 25 to 29.9 is considered overweight. ? A BMI of 30 and above is considered obese.   . Maintain normal blood lipids and cholesterol levels by exercising and minimizing your intake of saturated fat. Eat a balanced diet with plenty of fruit and vegetables. Blood tests for lipids and cholesterol should begin at age 20 and be repeated every 5 years. If your lipid or cholesterol levels are high, you are over 50, or you are at high risk for heart disease, you may need your cholesterol levels checked more frequently. Ongoing high lipid and cholesterol levels should be treated with medicines if diet and exercise are not working.  . If you smoke, find out from your health care provider how to quit. If you do not use tobacco, please do not start.  . If you choose to drink alcohol, please do not consume more than 2 drinks per day. One drink is considered to be 12 ounces (355 mL) of beer, 5 ounces (148 mL) of wine, or 1.5 ounces (44 mL) of liquor.  . If you are 55-79 years old, ask your health care provider if you should take aspirin to prevent strokes.  . Use sunscreen. Apply sunscreen liberally and repeatedly throughout the day. You should seek shade when your shadow is shorter than you. Protect yourself by wearing long sleeves, pants, a wide-brimmed hat, and sunglasses   year round, whenever you are outdoors.  . Once a month, do a whole body skin exam, using a mirror to look at the skin on your back. Tell your health care provider of new moles, moles that have irregular borders, moles that are larger than a pencil eraser, or moles that have changed in shape or color.     

## 2017-11-08 NOTE — Assessment & Plan Note (Signed)
Chronic, stable. Continue current regimen. This is filled by EP

## 2017-11-08 NOTE — Patient Instructions (Addendum)
I have refilled omeprazole. We will order bone density scan and mammogram to see if able to be done on the same day at [facility]. You are doing well today.  Return as needed or in 1 year for next physical.

## 2017-11-08 NOTE — Assessment & Plan Note (Addendum)
Update DEXA. Previously declined medication. Reviewed calcium and vit D dosing. Reviewed importance of regular weight bearing exercises. She has started going to gym regularly.

## 2017-11-08 NOTE — Progress Notes (Signed)
Pre visit review using our clinic review tool, if applicable. No additional management support is needed unless otherwise documented below in the visit note. 

## 2017-11-09 NOTE — Progress Notes (Signed)
I reviewed health advisor's note, was available for consultation, and agree with documentation and plan.  

## 2017-11-10 ENCOUNTER — Other Ambulatory Visit: Payer: Self-pay | Admitting: Internal Medicine

## 2017-11-10 DIAGNOSIS — I480 Paroxysmal atrial fibrillation: Secondary | ICD-10-CM

## 2017-11-27 ENCOUNTER — Encounter: Payer: Self-pay | Admitting: Internal Medicine

## 2017-11-27 ENCOUNTER — Ambulatory Visit (INDEPENDENT_AMBULATORY_CARE_PROVIDER_SITE_OTHER): Payer: Medicare Other | Admitting: Internal Medicine

## 2017-11-27 ENCOUNTER — Telehealth: Payer: Self-pay

## 2017-11-27 VITALS — BP 128/60 | HR 64 | Ht 64.0 in | Wt 117.0 lb

## 2017-11-27 DIAGNOSIS — Z1211 Encounter for screening for malignant neoplasm of colon: Secondary | ICD-10-CM

## 2017-11-27 DIAGNOSIS — Z7901 Long term (current) use of anticoagulants: Secondary | ICD-10-CM

## 2017-11-27 DIAGNOSIS — I480 Paroxysmal atrial fibrillation: Secondary | ICD-10-CM | POA: Diagnosis not present

## 2017-11-27 NOTE — Telephone Encounter (Signed)
Patient informed and verbalized understanding

## 2017-11-27 NOTE — Progress Notes (Signed)
Routing to Anticoag Clinic. 

## 2017-11-27 NOTE — Telephone Encounter (Signed)
Routing to Anticoag Clinic. 

## 2017-11-27 NOTE — Telephone Encounter (Signed)
Alexis Bentley  [address] [place] [address]  DOB: [birth year] MRN: [id number]   Dear Dr Thompson Grayer, ,    We have scheduled the above patient for an endoscopic procedure. Our records show that she is on anticoagulation therapy.   Please advise as to how long the patient may come off her therapy of Eliquis prior to the colonoscopy procedure, which is scheduled for 02/13/18.  Please fax back/ or route the completed form to Catrina Fellenz [place], [place] at [phone].   Sincerely,    Silvano Rusk, MD, [facility]

## 2017-11-27 NOTE — Patient Instructions (Signed)
You have been scheduled for a colonoscopy. Please follow written instructions given to you at your visit today.  Please pick up your prep supplies at the pharmacy. If you use inhalers (even only as needed), please bring them with you on the day of your procedure.   I appreciate the opportunity to care for you. Carl Gessner, MD, FACG 

## 2017-11-27 NOTE — Telephone Encounter (Signed)
Pt takes Eliquis for afib with CHADS2VASc score of 4 (HTN, age, sex, CAD). Renal function is normal. Ok to hold Eliquis for 1-2 days prior to procedure as needed.

## 2017-11-27 NOTE — Progress Notes (Signed)
JARELIS EHLERT 72 y.o. August 05, 1946 [id number]  Assessment & Plan:   Encounter Diagnoses  Name Primary?  . Colon cancer screening Yes  . Long term current use of anticoagulant   . PAF (paroxysmal atrial fibrillation) ([place])    Screening colonoscopy is recommended and she agrees.  Will hold Eliquis 2 days prior to endoscopic procedures - will instruct when and how to resume after procedure. Benefits and risks of procedure explained including risks of bleeding, perforation, infection, missed lesions, reactions to medications and possible need for hospitalization and surgery for complications. Additional rare but real risk of stroke or other vascular clotting events off Eliquis also explained and need to seek urgent help if any signs of these problems occur. Will communicate by phone or EMR with patient's  prescribing provider to confirm that holding Eliquis is reasonable in this case.   I appreciate the opportunity to care for this patient. SW:[id number], Garlon Hatchet, MD   Subjective:   Chief Complaint:  HPI  Here to schedule colonoscopy - she takes Eliquis for PAF. No GI c/o now. She had nausea and weight loss last year - neg EGD and [place] - we thought stress causing. She is better now. No current GI sxs. Remote hx rectal prolapse repair.  Last colonoscopy 2008 - negative.  Wt Readings from Last 3 Encounters:  11/27/17 117 lb (53.1 kg)  11/08/17 117 lb 4 oz (53.2 kg)  11/08/17 117 lb 4 oz (53.2 kg)     No Known Allergies Current Meds  Medication Sig  . Calcium Carbonate-Vitamin D (CALTRATE 600+D) 600-400 MG-UNIT per tablet Take 1 tablet by mouth 2 (two) times daily.   Marland Kitchen ELIQUIS 5 MG TABS tablet TAKE 1 TABLET BY MOUTH TWICE DAILY  . lisinopril (PRINIVIL,ZESTRIL) 20 MG tablet TAKE 1 TABLET BY MOUTH EVERY DAY  . metoprolol tartrate (LOPRESSOR) 25 MG tablet TAKE 1 TABLET(25 MG) BY MOUTH TWICE DAILY  . omeprazole (PRILOSEC) 40 MG capsule TAKE 1 CAPSULE BY MOUTH EVERY DAY  . vitamin  B-12 (CYANOCOBALAMIN) 500 MCG tablet Take 500 mcg by mouth every Monday, Wednesday, and Friday.   Past Medical History:  Diagnosis Date  . Chest pain   . Erosive gastritis   . Gallbladder polyp 10/2016   by [place] 4.70mm  . GERD (gastroesophageal reflux disease)   . Hemorrhoids   . Hiatal hernia   . Hypertension   . Lumbar back pain    With radiculopathy  . Non-ST elevation myocardial infarction (NSTEMI) ([place]) 2010   likely type 2 - demand ischemia during afib  . Osteoporosis 01/07/2015   T-2.5 hip  . Paroxysmal atrial fibrillation ([place]) 2010   one episode  . Peptic ulcer disease   . PVD (posterior vitreous detachment), bilateral 10/13/2016   Followed by ophtho Dr Kathlen Mody @ [facility]  . Rectal prolapse   . Syncope 03/2015   stable head CT, echo, carotids, labwork, EKG  . Transaminitis 2015   normal iron, viral hep panel, stable abd [place] (benign gallbladder polyp)   Past Surgical History:  Procedure Laterality Date  . CATARACT EXTRACTION Bilateral 2010  . COLONOSCOPY  12/03/2006   Internal/ external hemorrhoids, no polyps  . CORONARY ANGIOPLASTY  08/07/2009   NML  . DEXA  03/2015   T -2.5 hip  . ESOPHAGOGASTRODUODENOSCOPY  04/27/2007   Erosive and ulcerative gastritis; small H.H.  . ESOPHAGOGASTRODUODENOSCOPY  10/2016   WNL - sxs attributed to stress [name])  . PARTIAL HYSTERECTOMY  1981   Bleeding, cyst, bladder tack  .  RECTAL PROLAPSE REPAIR  2005  . TOTAL ABDOMINAL HYSTERECTOMY  2008   With a bilateral salpingo- oophorectomy   Social History   Social History Narrative   Married   Lives with husband.  2 grown children   Daily caffeine use   Retired 2013 was [employer]   Activity: walking   Diet: good water intake, fruits/vegetables daily   family history includes Alzheimer's disease in her father; COPD in her mother; Colon cancer in her father; Coronary artery disease in her father and mother; Diabetes Mellitus I in her son; Heart attack in her father;  Heart disease in her sister; Hypertension in her father and mother; Prostate cancer in her brother; Stroke in her father; Testicular cancer in her brother.   Review of Systems Denies recent A fib - "if I have gone into it I cannot tell" Stress is better  Objective:   Physical Exam @BP  128/60 (BP Location: Left Arm, Patient Position: Sitting, Cuff Size: Normal)   Pulse 64   Ht 5\' 4"  (1.626 m) Comment: height measured without shoes  Wt 117 lb (53.1 kg)   BMI 20.08 kg/m @  General:  NAD Eyes:   anicteric Lungs:  clear Heart::  S1S2 no rubs, murmurs or gallops Abdomen:  soft and nontender, BS+    Data Reviewed:   Prior colonoscopy 2018 EGD and GI notes

## 2017-11-29 ENCOUNTER — Other Ambulatory Visit: Payer: Self-pay | Admitting: Internal Medicine

## 2017-11-30 ENCOUNTER — Other Ambulatory Visit: Payer: Self-pay | Admitting: Internal Medicine

## 2017-11-30 DIAGNOSIS — I480 Paroxysmal atrial fibrillation: Secondary | ICD-10-CM

## 2017-12-06 ENCOUNTER — Ambulatory Visit
Admission: RE | Admit: 2017-12-06 | Discharge: 2017-12-06 | Disposition: A | Discharge status: Home / Self Care | Payer: Medicare Other | Source: Ambulatory Visit | Attending: Family Medicine | Admitting: Family Medicine

## 2017-12-06 DIAGNOSIS — M810 Age-related osteoporosis without current pathological fracture: Secondary | ICD-10-CM | POA: Insufficient documentation

## 2017-12-06 DIAGNOSIS — Z1231 Encounter for screening mammogram for malignant neoplasm of breast: Secondary | ICD-10-CM | POA: Diagnosis not present

## 2017-12-06 DIAGNOSIS — Z1239 Encounter for other screening for malignant neoplasm of breast: Secondary | ICD-10-CM

## 2017-12-06 NOTE — Progress Notes (Signed)
Electrophysiology Office Note Date: 12/08/2017  ID:  Alexis, Bentley June 05, 1946, MRN [id number]  PCP: Ria Bush, MD Electrophysiologist: Alexis Bentley  CC: AF follow up  Alexis Bentley is a 72 y.o. female seen today for Dr Alexis Bentley.  She has a history of palpitations that have been identifeid as AF, PAC's, and PVC's.  She presents today for electrophysiology followup. Since last being seen in clinic, she reports doing very well.  She has no awareness of AF. She asks about need to continue [place].   She denies chest pain, palpitations, dyspnea, PND, orthopnea, nausea, vomiting, dizziness, syncope, edema, weight gain, or early satiety.  Past Medical History:  Diagnosis Date  . Chest pain   . Erosive gastritis   . Gallbladder polyp 10/2016   by [place] 4.101mm  . GERD (gastroesophageal reflux disease)   . Hemorrhoids   . Hiatal hernia   . Hypertension   . Lumbar back pain    With radiculopathy  . Non-ST elevation myocardial infarction (NSTEMI) ([place]) 2010   likely type 2 - demand ischemia during afib  . Osteoporosis 01/07/2015   T-2.5 hip  . Paroxysmal atrial fibrillation ([place]) 2010   one episode  . Peptic ulcer disease   . PVD (posterior vitreous detachment), bilateral 10/13/2016   Followed by ophtho Dr Kathlen Mody @ [facility]  . Rectal prolapse   . Syncope 03/2015   stable head CT, echo, carotids, labwork, EKG  . Transaminitis 2015   normal iron, viral hep panel, stable abd [place] (benign gallbladder polyp)   Past Surgical History:  Procedure Laterality Date  . CATARACT EXTRACTION Bilateral 2010  . COLONOSCOPY  12/03/2006   Internal/ external hemorrhoids, no polyps  . CORONARY ANGIOPLASTY  08/07/2009   NML  . DEXA  03/2015   T -2.5 hip  . ESOPHAGOGASTRODUODENOSCOPY  04/27/2007   Erosive and ulcerative gastritis; small H.H.  . ESOPHAGOGASTRODUODENOSCOPY  10/2016   WNL - sxs attributed to stress [name])  . OOPHORECTOMY    . PARTIAL HYSTERECTOMY  1981   Bleeding, cyst, bladder tack  .  RECTAL PROLAPSE REPAIR  2005  . TOTAL ABDOMINAL HYSTERECTOMY  2008   With a bilateral salpingo- oophorectomy    Current Outpatient Medications  Medication Sig Dispense Refill  . apixaban (ELIQUIS) 5 MG TABS tablet Take 1 tablet (5 mg total) by mouth 2 (two) times daily. 60 tablet 10  . Calcium Carbonate-Vitamin D (CALTRATE 600+D) 600-400 MG-UNIT per tablet Take 1 tablet by mouth 2 (two) times daily.     . hydrOXYzine (ATARAX/VISTARIL) 10 MG tablet TAKE 1 TABLET(10 MG) BY MOUTH TWICE DAILY AS NEEDED FOR ANXIETY 30 tablet 0  . lisinopril (PRINIVIL,ZESTRIL) 20 MG tablet Take 1 tablet (20 mg total) by mouth daily. 90 tablet 3  . metoprolol tartrate (LOPRESSOR) 25 MG tablet Take 1 tablet (25 mg total) by mouth 2 (two) times daily. 8 180 tablet 3  . omeprazole (PRILOSEC) 40 MG capsule TAKE 1 CAPSULE BY MOUTH EVERY DAY 90 capsule 3  . vitamin B-12 (CYANOCOBALAMIN) 500 MCG tablet Take 500 mcg by mouth every Monday, Wednesday, and Friday.     No current facility-administered medications for this visit.     Allergies:   Patient has no known allergies.   Social History: Social History   Socioeconomic History  . Marital status: Married    Spouse name: Not on file  . Number of children: 2  . Years of education: Not on file  . Highest education level: Not  on file  Social Needs  . Financial resource strain: Not on file  . Food insecurity - worry: Not on file  . Food insecurity - inability: Not on file  . Transportation needs - medical: Not on file  . Transportation needs - non-medical: Not on file  Occupational History  . Occupation: retired    Fish farm manager: [employer]  Tobacco Use  . Smoking status: Never Smoker  . Smokeless tobacco: Never Used  Substance and Sexual Activity  . Alcohol use: No    Alcohol/week: 0.0 oz  . Drug use: No  . Sexual activity: Yes    Birth control/protection: Surgical, Post-menopausal  Other Topics Concern  . Not on file  Social History Narrative    Married   Lives with husband.  2 grown children   Daily caffeine use   Retired 2013 was [employer]   Activity: walking   Diet: good water intake, fruits/vegetables daily    Family History: Family History  Problem Relation Age of Onset  . Hypertension Mother   . COPD Mother        Prior smoker  . Coronary artery disease Mother        stents  . Heart attack Father   . Hypertension Father   . Stroke Father        Ministrokes  . Coronary artery disease Father        x4 stents, Pacer  . Colon cancer Father   . Alzheimer's disease Father   . Heart disease Sister   . Testicular cancer Brother   . Prostate cancer Brother   . Diabetes Mellitus I Son   . Stomach cancer Neg Hx   . Rectal cancer Neg Hx   . Esophageal cancer Neg Hx   . Liver cancer Neg Hx   . Breast cancer Neg Hx     Review of Systems: All other systems reviewed and are otherwise negative except as noted above.   Physical Exam: VS:  BP (!) 146/70   Pulse 62   Ht 5\' 4"  (1.626 m)   Wt 114 lb (51.7 kg)   SpO2 99%   BMI 19.57 kg/m  , BMI Body mass index is 19.57 kg/m. Wt Readings from Last 3 Encounters:  12/07/17 114 lb (51.7 kg)  11/27/17 117 lb (53.1 kg)  11/08/17 117 lb 4 oz (53.2 kg)    GEN- The patient is well appearing, alert and oriented x 3 today.   HEENT: normocephalic, atraumatic; sclera clear, conjunctiva pink; hearing intact; oropharynx clear; neck supple Lungs- Clear to ausculation bilaterally, normal work of breathing.  No wheezes, rales, rhonchi Heart- Regular rate and rhythm  GI- soft, non-tender, non-distended, bowel sounds present Extremities- no clubbing, cyanosis, or edema  MS- no significant deformity or atrophy Skin- warm and dry, no rash or lesion  Psych- euthymic mood, full affect Neuro- strength and sensation are intact   EKG:  EKG is not ordered today.  Recent Labs: No results found for requested labs within last 8760 hours.    Other studies  Reviewed: Additional studies/ records that were reviewed today include: Dr Jackalyn Lombard office notes, recent testing  Assessment and Plan: 1.  Paroxysmal atrial fibrillation Continue Eliquis for CHADS2VASC of least 3 (age, female, HTN) Maintaining SR by symptoms She asks about need for continued [place]. She is not having any complications, labs followed by PCP.  We discussed ILR implant to objectively quantify AF burden, she would like to continue Eliquis for now.  2.  HTN Stable No change required today  Current medicines are reviewed at length with the patient today.   The patient does not have concerns regarding her medicines.  The following changes were made today:  none  Labs/ tests ordered today include: none  Disposition:   Follow up with me in 1 year    Signed, Chanetta Marshall, NP 12/08/2017 6:21 AM   [place] [address] [phone] (office) [phone] (fax)

## 2017-12-07 ENCOUNTER — Encounter: Payer: Self-pay | Admitting: Nurse Practitioner

## 2017-12-07 ENCOUNTER — Ambulatory Visit (INDEPENDENT_AMBULATORY_CARE_PROVIDER_SITE_OTHER): Payer: Medicare Other | Admitting: Nurse Practitioner

## 2017-12-07 VITALS — BP 146/70 | HR 62 | Ht 64.0 in | Wt 114.0 lb

## 2017-12-07 DIAGNOSIS — I480 Paroxysmal atrial fibrillation: Secondary | ICD-10-CM | POA: Diagnosis not present

## 2017-12-07 DIAGNOSIS — I10 Essential (primary) hypertension: Secondary | ICD-10-CM

## 2017-12-07 MED ORDER — LISINOPRIL 20 MG PO TABS: 20.0000 mg | ORAL_TABLET | Freq: Every day | ORAL | 3 refills | Status: DC

## 2017-12-07 MED ORDER — METOPROLOL TARTRATE 25 MG PO TABS: 25.0000 mg | ORAL_TABLET | Freq: Two times a day (BID) | ORAL | 3 refills | Status: DC

## 2017-12-07 MED ORDER — METOPROLOL TARTRATE 25 MG PO TABS: ORAL_TABLET | ORAL | 3 refills | Status: DC

## 2017-12-07 MED ORDER — APIXABAN 5 MG PO TABS: 5.0000 mg | ORAL_TABLET | Freq: Two times a day (BID) | ORAL | 10 refills | Status: DC

## 2017-12-07 NOTE — Patient Instructions (Addendum)
Medication Instructions:   Your physician recommends that you continue on your current medications as directed. Please refer to the Current Medication list given to you today.   If you need a refill on your cardiac medications before your next appointment, please call your pharmacy.  Labwork: NONE ORDERED  TODAY    Testing/Procedures: NONE ORDERED  TODAY    Follow-Up: Your physician wants you to follow-up in: ONE YEAR WITH  SEILER   You will receive a reminder letter in the mail two months in advance. If you don't receive a letter, please call our office to schedule the follow-up appointment.     Any Other Special Instructions Will Be Listed Below (If Applicable).                                                                                                                                                   

## 2017-12-13 ENCOUNTER — Inpatient Hospital Stay
Admission: RE | Admit: 2017-12-13 | Discharge: 2017-12-13 | Disposition: A | Discharge status: Home / Self Care | Payer: Self-pay | Source: Ambulatory Visit | Attending: *Deleted | Admitting: *Deleted

## 2017-12-13 ENCOUNTER — Other Ambulatory Visit: Payer: Self-pay | Admitting: *Deleted

## 2017-12-13 DIAGNOSIS — Z9289 Personal history of other medical treatment: Secondary | ICD-10-CM

## 2017-12-15 ENCOUNTER — Other Ambulatory Visit: Payer: Self-pay | Admitting: Family Medicine

## 2017-12-15 DIAGNOSIS — N6489 Other specified disorders of breast: Secondary | ICD-10-CM

## 2017-12-15 DIAGNOSIS — R928 Other abnormal and inconclusive findings on diagnostic imaging of breast: Secondary | ICD-10-CM

## 2017-12-21 ENCOUNTER — Ambulatory Visit
Admission: RE | Admit: 2017-12-21 | Discharge: 2017-12-21 | Disposition: A | Discharge status: Home / Self Care | Payer: Medicare Other | Source: Ambulatory Visit | Attending: Family Medicine | Admitting: Family Medicine

## 2017-12-21 DIAGNOSIS — R928 Other abnormal and inconclusive findings on diagnostic imaging of breast: Secondary | ICD-10-CM | POA: Insufficient documentation

## 2017-12-21 DIAGNOSIS — N6489 Other specified disorders of breast: Secondary | ICD-10-CM | POA: Insufficient documentation

## 2017-12-21 LAB — HM MAMMOGRAPHY

## 2017-12-22 ENCOUNTER — Encounter: Payer: Self-pay | Admitting: Family Medicine

## 2018-01-31 HISTORY — PX: COLONOSCOPY: SHX174

## 2018-02-08 ENCOUNTER — Telehealth: Payer: Self-pay | Admitting: *Deleted

## 2018-02-08 NOTE — Telephone Encounter (Signed)
Pt is on Eliquis.  She had an office with on 11-27-17 with Dr. [name].  I verified with Dr. [name] ok to hold Eliquis for 2 days before procedure and spoke with pt to make sure she was aware of this.  Understanding voiced.  From Dr. [name]:      I reviewed chart   She was stable at 12/2017 cardiology visit   Hold Eliquis 2 days before colonoscopy please

## 2018-02-13 ENCOUNTER — Encounter: Payer: Self-pay | Admitting: Internal Medicine

## 2018-02-13 ENCOUNTER — Ambulatory Visit (AMBULATORY_SURGERY_CENTER): Payer: Medicare Other | Admitting: Internal Medicine

## 2018-02-13 ENCOUNTER — Other Ambulatory Visit: Payer: Self-pay

## 2018-02-13 VITALS — BP 129/53 | HR 74 | Temp 97.8°F | Resp 16 | Ht 64.0 in | Wt 117.0 lb

## 2018-02-13 DIAGNOSIS — K219 Gastro-esophageal reflux disease without esophagitis: Secondary | ICD-10-CM | POA: Diagnosis not present

## 2018-02-13 DIAGNOSIS — I4891 Unspecified atrial fibrillation: Secondary | ICD-10-CM | POA: Diagnosis not present

## 2018-02-13 DIAGNOSIS — I2510 Atherosclerotic heart disease of native coronary artery without angina pectoris: Secondary | ICD-10-CM | POA: Diagnosis not present

## 2018-02-13 DIAGNOSIS — Z1211 Encounter for screening for malignant neoplasm of colon: Secondary | ICD-10-CM

## 2018-02-13 DIAGNOSIS — I10 Essential (primary) hypertension: Secondary | ICD-10-CM | POA: Diagnosis not present

## 2018-02-13 MED ORDER — SODIUM CHLORIDE 0.9 % IV SOLN
500.0000 mL | Freq: Once | INTRAVENOUS | Status: DC
Start: 2018-02-13 — End: 2018-11-15

## 2018-02-13 NOTE — Progress Notes (Signed)
Report to PACU, RN, vss, BBS= Clear.  

## 2018-02-13 NOTE — Patient Instructions (Addendum)
No polyps or cancer seen!  You do have hemorrhoids - actually normal but they were swollen. That commonly occurs from the prep.  No more routine repeat screening tests needed.  I appreciate the opportunity to care for you. Gatha Mayer, MD, FACG  YOU HAD AN ENDOSCOPIC PROCEDURE TODAY AT [facility]:   Refer to the procedure report that was given to you for any specific questions about what was found during the examination.  If the procedure report does not answer your questions, please call your gastroenterologist to clarify.  If you requested that your care partner not be given the details of your procedure findings, then the procedure report has been included in a sealed envelope for you to review at your convenience later.  YOU SHOULD EXPECT: Some feelings of bloating in the abdomen. Passage of more gas than usual.  Walking can help get rid of the air that was put into your GI tract during the procedure and reduce the bloating. If you had a lower endoscopy (such as a colonoscopy or flexible sigmoidoscopy) you may notice spotting of blood in your stool or on the toilet paper. If you underwent a bowel prep for your procedure, you may not have a normal bowel movement for a few days.  Please Note:  You might notice some irritation and congestion in your nose or some drainage.  This is from the oxygen used during your procedure.  There is no need for concern and it should clear up in a day or so.  SYMPTOMS TO REPORT IMMEDIATELY:   Following lower endoscopy (colonoscopy or flexible sigmoidoscopy):  Excessive amounts of blood in the stool  Significant tenderness or worsening of abdominal pains  Swelling of the abdomen that is new, acute  Fever of 100F or higher   Following upper endoscopy (EGD)  Vomiting of blood or coffee ground material  New chest pain or pain under the shoulder blades  Painful or persistently difficult swallowing  New shortness of  breath  Fever of 100F or higher  Black, tarry-looking stools  For urgent or emergent issues, a gastroenterologist can be reached at any hour by calling [phone].   DIET:  We do recommend a small meal at first, but then you may proceed to your regular diet.  Drink plenty of fluids but you should avoid alcoholic beverages for 24 hours.  ACTIVITY:  You should plan to take it easy for the rest of today and you should NOT DRIVE or use heavy machinery until tomorrow (because of the sedation medicines used during the test).    FOLLOW UP: Our staff will call the number listed on your records the next business day following your procedure to check on you and address any questions or concerns that you may have regarding the information given to you following your procedure. If we do not reach you, we will leave a message.  However, if you are feeling well and you are not experiencing any problems, there is no need to return our call.  We will assume that you have returned to your regular daily activities without incident.  If any biopsies were taken you will be contacted by phone or by letter within the next 1-3 weeks.  Please call us at [phone] if you have not heard about the biopsies in 3 weeks.    SIGNATURES/CONFIDENTIALITY: You and/or your care partner have signed paperwork which will be entered into your electronic medical record.  These signatures attest  to the fact that that the information above on your After Visit Summary has been reviewed and is understood.  Full responsibility of the confidentiality of this discharge information lies with you and/or your care-partner.  Hemorrhoid banding information given.  Resume Eliquis today at prior dose.

## 2018-02-13 NOTE — Op Note (Signed)
[facility] Patient Name: Alexis Bentley Procedure Date: 02/13/2018 8:13 AM MRN: [id number] Endoscopist: Gatha Mayer , MD Age: 72 Referring MD:  Date of Birth: 1946/01/16 Gender: Female Account #: [id number] Procedure:                Colonoscopy Indications:              Screening for colorectal malignant neoplasm, Last                            colonoscopy: 2008 Medicines:                Propofol per Anesthesia, Monitored Anesthesia Care Procedure:                Pre-Anesthesia Assessment:                           - Prior to the procedure, a History and Physical                            was performed, and patient medications and                            allergies were reviewed. The patient's tolerance of                            previous anesthesia was also reviewed. The risks                            and benefits of the procedure and the sedation                            options and risks were discussed with the patient.                            All questions were answered, and informed consent                            was obtained. Prior Anticoagulants: The patient                            last took Eliquis (apixaban) 2 days prior to the                            procedure. ASA Grade Assessment: II - A patient                            with mild systemic disease. After reviewing the                            risks and benefits, the patient was deemed in                            satisfactory condition to undergo the procedure.  After obtaining informed consent, the colonoscope                            was passed under direct vision. Throughout the                            procedure, the patient's blood pressure, pulse, and                            oxygen saturations were monitored continuously. The                            Colonoscope was introduced through the anus and                            advanced to the the  cecum, identified by                            appendiceal orifice and ileocecal valve. The                            colonoscopy was performed without difficulty. The                            patient tolerated the procedure well. The quality                            of the bowel preparation was excellent. The                            ileocecal valve, appendiceal orifice, and rectum                            were photographed. The bowel preparation used was                            Miralax. Scope In: 8:18:35 AM Scope Out: 8:32:09 AM Scope Withdrawal Time: 0 hours 9 minutes 11 seconds  Total Procedure Duration: 0 hours 13 minutes 34 seconds  Findings:                 The perianal and digital rectal examinations were                            normal.                           External and internal hemorrhoids were found during                            retroflexion.                           The exam was otherwise without abnormality on  direct and retroflexion views. Complications:            No immediate complications. Estimated Blood Loss:     Estimated blood loss: none. Impression:               - External and internal hemorrhoids.                           - The examination was otherwise normal on direct                            and retroflexion views.                           - No specimens collected. Recommendation:           - Patient has a contact number available for                            emergencies. The signs and symptoms of potential                            delayed complications were discussed with the                            patient. Return to normal activities tomorrow.                            Written discharge instructions were provided to the                            patient.                           - Resume previous diet.                           - Continue present medications.                           - No  repeat colonoscopy due to age and the absence                            of colonic polyps.                           - Resume Eliquis (apixaban) at prior dose today. Gatha Mayer, MD 02/13/2018 8:42:51 AM This report has been signed electronically.

## 2018-02-14 ENCOUNTER — Telehealth: Payer: Self-pay | Admitting: *Deleted

## 2018-02-14 NOTE — Telephone Encounter (Signed)
  Follow up Call-  Call back number 02/13/2018 10/18/2016  Post procedure Call Back phone  # [phone]  Permission to leave phone message Yes Yes  Some recent data might be hidden     Patient questions:  Do you have a fever, pain , or abdominal swelling? No. Pain Score  0 *  Have you tolerated food without any problems? Yes.    Have you been able to return to your normal activities? Yes.    Do you have any questions about your discharge instructions: Diet   No. Medications  No. Follow up visit  No.  Do you have questions or concerns about your Care? No.  Actions: * If pain score is 4 or above: No action needed, pain <4.

## 2018-02-19 ENCOUNTER — Encounter: Payer: Self-pay | Admitting: Family Medicine

## 2018-06-01 ENCOUNTER — Telehealth: Payer: Self-pay | Admitting: Family Medicine

## 2018-06-01 NOTE — Telephone Encounter (Signed)
Pt does not have any symptoms and pt feels fine; pt has taken Tamiflu before and was nauseated the entire time. Pt would prefer not to take Tamiflu and for now will do nothing. If pt develops symptoms she will cb. FYI to DR G.

## 2018-06-01 NOTE — Telephone Encounter (Signed)
Noted  

## 2018-06-01 NOTE — Telephone Encounter (Signed)
Copied from [place] [id number]. Topic: Quick Communication - See Telephone Encounter >> Jun 01, 2018 11:38 AM Bea Graff, NT wrote: CRM for notification. See Telephone encounter for: 06/01/18. Pts husband was seen at the doctor today and was diagnosed with the flu and she was told to call her PCP to let them know.

## 2018-07-04 DIAGNOSIS — Z23 Encounter for immunization: Secondary | ICD-10-CM | POA: Diagnosis not present

## 2018-10-16 DIAGNOSIS — D3131 Benign neoplasm of right choroid: Secondary | ICD-10-CM | POA: Diagnosis not present

## 2018-10-16 DIAGNOSIS — Z961 Presence of intraocular lens: Secondary | ICD-10-CM | POA: Diagnosis not present

## 2018-10-16 DIAGNOSIS — H26491 Other secondary cataract, right eye: Secondary | ICD-10-CM | POA: Diagnosis not present

## 2018-10-16 DIAGNOSIS — H3509 Other intraretinal microvascular abnormalities: Secondary | ICD-10-CM | POA: Diagnosis not present

## 2018-10-16 DIAGNOSIS — H43813 Vitreous degeneration, bilateral: Secondary | ICD-10-CM | POA: Diagnosis not present

## 2018-10-19 ENCOUNTER — Other Ambulatory Visit: Payer: Self-pay | Admitting: Family Medicine

## 2018-11-12 ENCOUNTER — Telehealth: Payer: Self-pay

## 2018-11-12 DIAGNOSIS — E538 Deficiency of other specified B group vitamins: Secondary | ICD-10-CM

## 2018-11-12 DIAGNOSIS — M818 Other osteoporosis without current pathological fracture: Secondary | ICD-10-CM

## 2018-11-12 DIAGNOSIS — E785 Hyperlipidemia, unspecified: Secondary | ICD-10-CM

## 2018-11-12 DIAGNOSIS — I10 Essential (primary) hypertension: Secondary | ICD-10-CM

## 2018-11-12 NOTE — Telephone Encounter (Signed)
CPE labs ordered 

## 2018-11-13 ENCOUNTER — Other Ambulatory Visit: Payer: Self-pay

## 2018-11-13 ENCOUNTER — Ambulatory Visit (INDEPENDENT_AMBULATORY_CARE_PROVIDER_SITE_OTHER): Payer: Medicare Other

## 2018-11-13 VITALS — BP 136/80 | HR 77 | Temp 98.2°F | Ht 64.75 in | Wt 124.8 lb

## 2018-11-13 DIAGNOSIS — M818 Other osteoporosis without current pathological fracture: Secondary | ICD-10-CM | POA: Diagnosis not present

## 2018-11-13 DIAGNOSIS — Z0000 Encounter for general adult medical examination without abnormal findings: Secondary | ICD-10-CM | POA: Diagnosis not present

## 2018-11-13 DIAGNOSIS — E785 Hyperlipidemia, unspecified: Secondary | ICD-10-CM | POA: Diagnosis not present

## 2018-11-13 DIAGNOSIS — E538 Deficiency of other specified B group vitamins: Secondary | ICD-10-CM | POA: Diagnosis not present

## 2018-11-13 DIAGNOSIS — I10 Essential (primary) hypertension: Secondary | ICD-10-CM | POA: Diagnosis not present

## 2018-11-13 LAB — CBC WITH DIFFERENTIAL/PLATELET
BASOS PCT: 1.2 % (ref 0.0–3.0)
Basophils Absolute: 0.1 10*3/uL (ref 0.0–0.1)
EOS ABS: 0.1 10*3/uL (ref 0.0–0.7)
Eosinophils Relative: 2.3 % (ref 0.0–5.0)
HCT: 39.3 % (ref 36.0–46.0)
Hemoglobin: 13.3 g/dL (ref 12.0–15.0)
LYMPHS ABS: 2 10*3/uL (ref 0.7–4.0)
Lymphocytes Relative: 31.8 % (ref 12.0–46.0)
MCHC: 33.8 g/dL (ref 30.0–36.0)
MCV: 90.3 fl (ref 78.0–100.0)
MONO ABS: 0.5 10*3/uL (ref 0.1–1.0)
Monocytes Relative: 7.4 % (ref 3.0–12.0)
NEUTROS ABS: 3.5 10*3/uL (ref 1.4–7.7)
NEUTROS PCT: 57.3 % (ref 43.0–77.0)
Platelets: 241 10*3/uL (ref 150.0–400.0)
RBC: 4.35 Mil/uL (ref 3.87–5.11)
RDW: 13.4 % (ref 11.5–15.5)
WBC: 6.2 10*3/uL (ref 4.0–10.5)

## 2018-11-13 LAB — VITAMIN B12

## 2018-11-13 LAB — COMPREHENSIVE METABOLIC PANEL
ALT: 31 U/L (ref 0–35)
AST: 36 U/L (ref 0–37)
Albumin: 4.4 g/dL (ref 3.5–5.2)
Alkaline Phosphatase: 94 U/L (ref 39–117)
BUN: 20 mg/dL (ref 6–23)
CHLORIDE: 100 meq/L (ref 96–112)
CO2: 28 meq/L (ref 19–32)
CREATININE: 0.98 mg/dL (ref 0.40–1.20)
Calcium: 10.2 mg/dL (ref 8.4–10.5)
GFR: 56.02 mL/min — ABNORMAL LOW (ref >60.00)
Glucose, Bld: 79 mg/dL (ref 70–99)
Potassium: 4.4 mEq/L (ref 3.5–5.1)
SODIUM: 138 meq/L (ref 135–145)
Total Bilirubin: 0.4 mg/dL (ref 0.2–1.2)
Total Protein: 7.5 g/dL (ref 6.0–8.3)

## 2018-11-13 LAB — LIPID PANEL
CHOL/HDL RATIO: 3
CHOLESTEROL: 213 mg/dL — AB (ref 0–200)
HDL: 73.4 mg/dL (ref >39.00)
LDL CALC: 116 mg/dL — AB (ref 0–99)
NonHDL: 139.82
Triglycerides: 121 mg/dL (ref 0.0–149.0)
VLDL: 24.2 mg/dL (ref 0.0–40.0)

## 2018-11-13 LAB — VITAMIN D 25 HYDROXY (VIT D DEFICIENCY, FRACTURES): VITD: 71.08 ng/mL (ref 30.00–100.00)

## 2018-11-13 LAB — TSH: TSH: 1.76 u[IU]/mL (ref 0.35–4.50)

## 2018-11-13 NOTE — Patient Instructions (Addendum)
Alexis Bentley , Thank you for taking time to come for your Medicare Wellness Visit. I appreciate your ongoing commitment to your health goals. Please review the following plan we discussed and let me know if I can assist you in the future.   These are the goals we discussed: Goals    . Increase physical activity     When schedule permits, I will resume exercising at least 60 minutes 3 days per week.        This is a list of the screening recommended for you and due dates:  Health Maintenance  Topic Date Due  . DTaP/Tdap/Td vaccine (1 - Tdap) 10/02/2020*  . Tetanus Vaccine  10/02/2020*  . Mammogram  12/22/2019  . Flu Shot  Completed  . DEXA scan (bone density measurement)  Completed  .  Hepatitis C: One time screening is recommended by Center for Disease Control  (CDC) for  adults born from [birth year] through 1963.   Completed  . Pneumonia vaccines  Completed  . Colon Cancer Screening  Discontinued  *Topic was postponed. The date shown is not the original due date.     Preventive Care for Adults  A healthy lifestyle and preventive care can promote health and wellness. Preventive health guidelines for adults include the following key practices.  . A routine yearly physical is a good way to check with your health care provider about your health and preventive screening. It is a chance to share any concerns and updates on your health and to receive a thorough exam.  . Visit your dentist for a routine exam and preventive care every 6 months. Brush your teeth twice a day and floss once a day. Good oral hygiene prevents tooth decay and gum disease.  . The frequency of eye exams is based on your age, health, family medical history, use  of contact lenses, and other factors. Follow your health care provider's recommendations for frequency of eye exams.  . Eat a healthy diet. Foods like vegetables, fruits, whole grains, low-fat dairy products, and lean protein foods contain the nutrients you need  without too many calories. Decrease your intake of foods high in solid fats, added sugars, and salt. Eat the right amount of calories for you. Get information about a proper diet from your health care provider, if necessary.  . Regular physical exercise is one of the most important things you can do for your health. Most adults should get at least 150 minutes of moderate-intensity exercise (any activity that increases your heart rate and causes you to sweat) each week. In addition, most adults need muscle-strengthening exercises on 2 or more days a week.  Silver Sneakers may be a benefit available to you. To determine eligibility, you may visit the website: www.silversneakers.com or contact program at [phone] Mon-Fri between 8AM-8PM.   . Maintain a healthy weight. The body mass index (BMI) is a screening tool to identify possible weight problems. It provides an estimate of body fat based on height and weight. Your health care provider can find your BMI and can help you achieve or maintain a healthy weight.   For adults 20 years and older: ? A BMI below 18.5 is considered underweight. ? A BMI of 18.5 to 24.9 is normal. ? A BMI of 25 to 29.9 is considered overweight. ? A BMI of 30 and above is considered obese.   . Maintain normal blood lipids and cholesterol levels by exercising and minimizing your intake of saturated fat. Eat a balanced  diet with plenty of fruit and vegetables. Blood tests for lipids and cholesterol should begin at age 2 and be repeated every 5 years. If your lipid or cholesterol levels are high, you are over 50, or you are at high risk for heart disease, you may need your cholesterol levels checked more frequently. Ongoing high lipid and cholesterol levels should be treated with medicines if diet and exercise are not working.  . If you smoke, find out from your health care provider how to quit. If you do not use tobacco, please do not start.  . If you choose to drink  alcohol, please do not consume more than 2 drinks per day. One drink is considered to be 12 ounces (355 mL) of beer, 5 ounces (148 mL) of wine, or 1.5 ounces (44 mL) of liquor.  . If you are 76-79 years old, ask your health care provider if you should take aspirin to prevent strokes.  . Use sunscreen. Apply sunscreen liberally and repeatedly throughout the day. You should seek shade when your shadow is shorter than you. Protect yourself by wearing long sleeves, pants, a wide-brimmed hat, and sunglasses year round, whenever you are outdoors.  . Once a month, do a whole body skin exam, using a mirror to look at the skin on your back. Tell your health care provider of new moles, moles that have irregular borders, moles that are larger than a pencil eraser, or moles that have changed in shape or color.

## 2018-11-13 NOTE — Progress Notes (Signed)
Subjective:   Alexis Bentley is a 73 y.o. female who presents for an Initial Medicare Annual Wellness Visit.  Review of Systems    N/A  Cardiac Risk Factors include: advanced age (>24men, >31 women);dyslipidemia;hypertension     Objective:    Today's Vitals   11/13/18 1038  BP: 136/80  Pulse: 77  Temp: 98.2 F (36.8 C)  TempSrc: Oral  SpO2: 99%  Weight: 124 lb 12 oz (56.6 kg)  Height: 5' 4.75" (1.645 m)  PainSc: 0-No pain   Body mass index is 20.92 kg/m.  Advanced Directives 11/13/2018 11/08/2017 07/08/2016  Does Patient Have a Medical Advance Directive? No No No  Would patient like information on creating a medical advance directive? No - Patient declined No - Patient declined Yes - Educational materials given    Current Medications (verified) Outpatient Encounter Medications as of 11/13/2018  Medication Sig  . apixaban (ELIQUIS) 5 MG TABS tablet Take 1 tablet (5 mg total) by mouth 2 (two) times daily.  . Calcium Carbonate-Vitamin D (CALTRATE 600+D) 600-400 MG-UNIT per tablet Take 1 tablet by mouth 2 (two) times daily.   . hydrOXYzine (ATARAX/VISTARIL) 10 MG tablet TAKE 1 TABLET(10 MG) BY MOUTH TWICE DAILY AS NEEDED FOR ANXIETY  . lisinopril (PRINIVIL,ZESTRIL) 20 MG tablet Take 1 tablet (20 mg total) by mouth daily.  . metoprolol tartrate (LOPRESSOR) 25 MG tablet Take 1 tablet (25 mg total) by mouth 2 (two) times daily. 8  . omeprazole (PRILOSEC) 40 MG capsule TAKE 1 CAPSULE BY MOUTH EVERY DAY  . vitamin B-12 (CYANOCOBALAMIN) 500 MCG tablet Take 500 mcg by mouth every Monday, Wednesday, and Friday.   Facility-Administered Encounter Medications as of 11/13/2018  Medication  . 0.9 %  sodium chloride infusion    Allergies (verified) Patient has no known allergies.   History: Past Medical History:  Diagnosis Date  . Chest pain   . Erosive gastritis   . Gallbladder polyp 10/2016   by [place] 4.22mm  . GERD (gastroesophageal reflux disease)   . Hemorrhoids   . Hiatal  hernia   . Hypertension   . Lumbar back pain    With radiculopathy  . Osteoporosis 01/07/2015   T-2.5 hip  . Paroxysmal atrial fibrillation ([place]) 2010   one episode  . Peptic ulcer disease   . PVD (posterior vitreous detachment), bilateral 10/13/2016   Followed by ophtho Dr Kathlen Mody @ [place]  . Rectal prolapse   . Syncope 03/2015   stable head CT, echo, carotids, labwork, EKG  . Transaminitis 2015   normal iron, viral hep panel, stable abd [place] (benign gallbladder polyp)   Past Surgical History:  Procedure Laterality Date  . cardiac catherization  08/07/2009   NML  . CATARACT EXTRACTION Bilateral 2010  . COLONOSCOPY  12/03/2006   Internal/ external hemorrhoids, no polyps  . COLONOSCOPY  01/2018   WNL, no rpt recommended [name])  . DEXA  03/2015   T -2.5 hip  . ESOPHAGOGASTRODUODENOSCOPY  04/27/2007   Erosive and ulcerative gastritis; small H.H.  . ESOPHAGOGASTRODUODENOSCOPY  10/2016   WNL - sxs attributed to stress [name])  . OOPHORECTOMY    . PARTIAL HYSTERECTOMY  1981   Bleeding, cyst, bladder tack  . RECTAL PROLAPSE REPAIR  2005  . TOTAL ABDOMINAL HYSTERECTOMY  2008   With a bilateral salpingo- oophorectomy   Family History  Problem Relation Age of Onset  . Hypertension Mother   . COPD Mother        Prior smoker  .  Coronary artery disease Mother        stents  . Heart attack Father   . Hypertension Father   . Stroke Father        Ministrokes  . Coronary artery disease Father        x4 stents, Pacer  . Colon cancer Father   . Alzheimer's disease Father   . Heart disease Sister   . Testicular cancer Brother   . Prostate cancer Brother   . Diabetes Mellitus I Son   . Stomach cancer Neg Hx   . Rectal cancer Neg Hx   . Esophageal cancer Neg Hx   . Liver cancer Neg Hx   . Breast cancer Neg Hx    Social History   Socioeconomic History  . Marital status: Married    Spouse name: Not on file  . Number of children: 2  . Years of education: Not on file  .  Highest education level: Not on file  Occupational History  . Occupation: retired    Fish farm manager: [employer]  . Financial resource strain: Not on file  . Food insecurity:    Worry: Not on file    Inability: Not on file  . Transportation needs:    Medical: Not on file    Non-medical: Not on file  Tobacco Use  . Smoking status: Never Smoker  . Smokeless tobacco: Never Used  Substance and Sexual Activity  . Alcohol use: No    Alcohol/week: 0.0 standard drinks  . Drug use: No  . Sexual activity: Yes    Birth control/protection: Surgical, Post-menopausal  Lifestyle  . Physical activity:    Days per week: Not on file    Minutes per session: Not on file  . Stress: Not on file  Relationships  . Social connections:    Talks on phone: Not on file    Gets together: Not on file    Attends religious service: Not on file    Active member of club or organization: Not on file    Attends meetings of clubs or organizations: Not on file    Relationship status: Not on file  Other Topics Concern  . Not on file  Social History Narrative   Married   Lives with husband.  2 grown children   Daily caffeine use   Retired 2013 was [employer]   Activity: walking   Diet: good water intake, fruits/vegetables daily    Tobacco Counseling Counseling given: No   Clinical Intake:  Pre-visit preparation completed: Yes  Pain : No/denies pain Pain Score: 0-No pain     Nutritional Status: BMI 25 -29 Overweight Nutritional Risks: None Diabetes: No  How often do you need to have someone help you when you read instructions, pamphlets, or other written materials from your doctor or pharmacy?: 1 - Never What is the last grade level you completed in school?: 12th grade  Interpreter Needed?: No  Comments: pt lives with spouse Information entered by :: LPinson, LPN   Activities of Daily Living In your present state of health, do you have any difficulty  performing the following activities: 11/13/2018  Hearing? N  Vision? N  Difficulty concentrating or making decisions? N  Walking or climbing stairs? N  Dressing or bathing? N  Doing errands, shopping? N  Preparing Food and eating ? N  Using the Toilet? N  In the past six months, have you accidently leaked urine? N  Do you have problems with loss  of bowel control? N  Managing your Medications? N  Managing your Finances? N  Housekeeping or managing your Housekeeping? N  Some recent data might be hidden     Immunizations and Health Maintenance Immunization History  Administered Date(s) Administered  . Influenza, High Dose Seasonal PF 07/04/2018  . Influenza,inj,Quad PF,6+ Mos 07/10/2014, 07/02/2015, 07/08/2016, 07/13/2017  . Influenza-Unspecified 07/03/2013  . Pneumococcal Conjugate-13 05/19/2015  . Pneumococcal Polysaccharide-23 07/08/2016  . Td 10/03/1993, 06/10/2008   There are no preventive care reminders to display for this patient.  Patient Care Team: Ria Bush, MD as PCP - General (Family Medicine) Gatha Mayer, MD as Consulting Physician (Gastroenterology) Thompson Grayer, MD as Consulting Physician (Cardiology) Monna Fam, MD as Consulting Physician (Ophthalmology)  Indicate any recent Medical Services you may have received from other than [facility] providers in the past year (date may be approximate).     Assessment:   This is a routine wellness examination for [facility].  Hearing/Vision screen  Hearing Screening   125Hz  250Hz  500Hz  1000Hz  2000Hz  3000Hz  4000Hz  6000Hz  8000Hz   Right ear:   40 40 40  40    Left ear:   40 40 40  40    Vision Screening Comments: Vision exam in Jan 2020 with Dr. Kathlen Mody  Dietary issues and exercise activities discussed: Current Exercise Habits: The patient does not participate in regular exercise at present, Exercise limited by: None identified  Goals    . Increase physical activity     When schedule permits, I will resume  exercising at least 60 minutes 3 days per week.       Depression Screen PHQ 2/9 Scores 11/13/2018 11/08/2017 07/08/2016 05/19/2015  PHQ - 2 Score 0 0 0 0  PHQ- 9 Score 0 0 - -    Fall Risk Fall Risk  11/13/2018 11/08/2017 07/08/2016 05/19/2015  Falls in the past year? 0 No No No   Cognitive Function: MMSE - Mini Mental State Exam 11/13/2018 11/08/2017 07/08/2016  Orientation to time 5 5 5   Orientation to Place 5 5 5   Registration 3 3 3   Attention/ Calculation 0 0 0  Recall 3 3 3   Language- name 2 objects 0 0 0  Language- repeat 1 1 1   Language- follow 3 step command 3 3 3   Language- read & follow direction 0 0 0  Write a sentence 0 0 0  Copy design 0 0 0  Total score 20 20 20      PLEASE NOTE: A Mini-Cog screen was completed. Maximum score is 20. A value of 0 denotes this part of Folstein MMSE was not completed or the patient failed this part of the Mini-Cog screening.   Mini-Cog Screening Orientation to Time - Max 5 pts Orientation to Place - Max 5 pts Registration - Max 3 pts Recall - Max 3 pts Language Repeat - Max 1 pts Language Follow 3 Step Command - Max 3 pts     Screening Tests Health Maintenance  Topic Date Due  . DTaP/Tdap/Td (1 - Tdap) 10/02/2020 (Originally 04/28/1957)  . TETANUS/TDAP  10/02/2020 (Originally 06/10/2018)  . MAMMOGRAM  12/22/2019  . INFLUENZA VACCINE  Completed  . DEXA SCAN  Completed  . Hepatitis C Screening  Completed  . PNA vac Low Risk Adult  Completed  . COLONOSCOPY  Discontinued     Plan:     I have personally reviewed, addressed, and noted the following in the patient's chart:  A. Medical and social history B. Use of alcohol, tobacco or  illicit drugs  C. Current medications and supplements D. Functional ability and status E.  Nutritional status F.  Physical activity G. Advance directives H. List of other physicians I.  Hospitalizations, surgeries, and ER visits in previous 12 months J.  [place] to include hearing, vision,  cognitive, depression L. Referrals and appointments - none  In addition, I have reviewed and discussed with patient certain preventive protocols, quality metrics, and best practice recommendations. A written personalized care plan for preventive services as well as general preventive health recommendations were provided to patient.  See attached scanned questionnaire for additional information.   Signed,   Lindell Noe, MHA, BS, LPN Health Coach

## 2018-11-13 NOTE — Progress Notes (Signed)
PCP notes:   Health maintenance:  No gaps identified  Abnormal screenings:   None  Patient concerns:   None  Nurse concerns:  None  Next PCP appt:   11/15/18 @ 1030

## 2018-11-13 NOTE — Telephone Encounter (Signed)
Patient in office today for AWV. Request for refill of Hydroxyzine 10 mg by mouth twice daily as needed for anxiety. Pharmacy of choice is [facility].

## 2018-11-14 MED ORDER — HYDROXYZINE HCL 10 MG PO TABS: ORAL_TABLET | ORAL | 0 refills | Status: DC

## 2018-11-14 NOTE — Progress Notes (Signed)
   Subjective:    Patient ID: Alexis Bentley, female    DOB: October 21, 1945, 73 y.o.   MRN: [id number]  HPI I reviewed health advisor's note, was available for consultation, and agree with documentation and plan.    Review of Systems     Objective:   Physical Exam         Assessment & Plan:

## 2018-11-15 ENCOUNTER — Ambulatory Visit (INDEPENDENT_AMBULATORY_CARE_PROVIDER_SITE_OTHER): Payer: Medicare Other | Admitting: Family Medicine

## 2018-11-15 ENCOUNTER — Encounter: Payer: Self-pay | Admitting: Family Medicine

## 2018-11-15 ENCOUNTER — Telehealth: Payer: Self-pay

## 2018-11-15 VITALS — BP 124/70 | HR 57 | Temp 98.6°F | Ht 64.75 in | Wt 125.1 lb

## 2018-11-15 DIAGNOSIS — I10 Essential (primary) hypertension: Secondary | ICD-10-CM

## 2018-11-15 DIAGNOSIS — E538 Deficiency of other specified B group vitamins: Secondary | ICD-10-CM | POA: Diagnosis not present

## 2018-11-15 DIAGNOSIS — Z7189 Other specified counseling: Secondary | ICD-10-CM

## 2018-11-15 DIAGNOSIS — K219 Gastro-esophageal reflux disease without esophagitis: Secondary | ICD-10-CM

## 2018-11-15 DIAGNOSIS — E785 Hyperlipidemia, unspecified: Secondary | ICD-10-CM | POA: Diagnosis not present

## 2018-11-15 DIAGNOSIS — M810 Age-related osteoporosis without current pathological fracture: Secondary | ICD-10-CM

## 2018-11-15 DIAGNOSIS — F411 Generalized anxiety disorder: Secondary | ICD-10-CM | POA: Diagnosis not present

## 2018-11-15 DIAGNOSIS — I4891 Unspecified atrial fibrillation: Secondary | ICD-10-CM

## 2018-11-15 MED ORDER — HYDROXYZINE HCL 25 MG PO TABS: 12.5000 mg | ORAL_TABLET | Freq: Two times a day (BID) | ORAL | 3 refills | Status: DC | PRN

## 2018-11-15 MED ORDER — VITAMIN B-12 500 MCG PO TABS: 500.0000 ug | ORAL_TABLET | ORAL | Status: DC

## 2018-11-15 MED ORDER — OMEPRAZOLE 40 MG PO CPDR: 40.0000 mg | DELAYED_RELEASE_CAPSULE | Freq: Every day | ORAL | 3 refills | Status: DC

## 2018-11-15 NOTE — Assessment & Plan Note (Addendum)
Chronic, stable. Continue current regimen - managed by EP.

## 2018-11-15 NOTE — Assessment & Plan Note (Signed)
Discussed latest DEXA with patient showing progression. Discussed osteoporosis treatment options - she declines for now, would like to reassess with rpt DEXA in 1 year. In interim, will continue calcium/vit D and regular weight bearing exercises.

## 2018-11-15 NOTE — Assessment & Plan Note (Signed)
Advanced directive: completed at home, asked to bring a copy. Would want husband then youngest son (Keith Bottoms) to be HCPOA.  

## 2018-11-15 NOTE — Assessment & Plan Note (Signed)
Sees EP, appreciate their care. Continue eliquis

## 2018-11-15 NOTE — Assessment & Plan Note (Signed)
Levels remain elevated - will change vit D to once weekly dosing.

## 2018-11-15 NOTE — Assessment & Plan Note (Signed)
Chronic, mildly elevated today. Encouraged healthy diet to control lipids. The 10-year ASCVD risk score Mikey Bussing DC Brooke Bonito., et al., 2013) is: 11.4%   Values used to calculate the score:     Age: 73 years     Sex: Female     Is Non-Hispanic African American: No     Diabetic: No     Tobacco smoker: No     Systolic Blood Pressure: 924 mmHg     Is BP treated: Yes     HDL Cholesterol: 73.4 mg/dL     Total Cholesterol: 213 mg/dL

## 2018-11-15 NOTE — Patient Instructions (Addendum)
If interested, check with pharmacy about new 2 shot shingles series (shingrix).  Bring me copy of your living will to update your chart.  Decrease vitamin B12 to once weekly.  Make sure to stay well hydrated for kidney function. Call sometime next month to schedule mammogram. We will repeat bone density scan next year. In meantime, continue calcium vitamin d and regular weight bearing exercise.   Health Maintenance After Age 72 After age 71, you are at a higher risk for certain long-term diseases and infections as well as injuries from falls. Falls are a major cause of broken bones and head injuries in people who are older than age 60. Getting regular preventive care can help to keep you healthy and well. Preventive care includes getting regular testing and making lifestyle changes as recommended by your health care provider. Talk with your health care provider about:  Which screenings and tests you should have. A screening is a test that checks for a disease when you have no symptoms.  A diet and exercise plan that is right for you. What should I know about screenings and tests to prevent falls? Screening and testing are the best ways to find a health problem early. Early diagnosis and treatment give you the best chance of managing medical conditions that are common after age 6. Certain conditions and lifestyle choices may make you more likely to have a fall. Your health care provider may recommend:  Regular vision checks. Poor vision and conditions such as cataracts can make you more likely to have a fall. If you wear glasses, make sure to get your prescription updated if your vision changes.  Medicine review. Work with your health care provider to regularly review all of the medicines you are taking, including over-the-counter medicines. Ask your health care provider about any side effects that may make you more likely to have a fall. Tell your health care provider if any medicines that you  take make you feel dizzy or sleepy.  Osteoporosis screening. Osteoporosis is a condition that causes the bones to get weaker. This can make the bones weak and cause them to break more easily.  Blood pressure screening. Blood pressure changes and medicines to control blood pressure can make you feel dizzy.  Strength and balance checks. Your health care provider may recommend certain tests to check your strength and balance while standing, walking, or changing positions.  Foot health exam. Foot pain and numbness, as well as not wearing proper footwear, can make you more likely to have a fall.  Depression screening. You may be more likely to have a fall if you have a fear of falling, feel emotionally low, or feel unable to do activities that you used to do.  Alcohol use screening. Using too much alcohol can affect your balance and may make you more likely to have a fall. What actions can I take to lower my risk of falls? General instructions  Talk with your health care provider about your risks for falling. Tell your health care provider if: ? You fall. Be sure to tell your health care provider about all falls, even ones that seem minor. ? You feel dizzy, sleepy, or off-balance.  Take over-the-counter and prescription medicines only as told by your health care provider. These include any supplements.  Eat a healthy diet and maintain a healthy weight. A healthy diet includes low-fat dairy products, low-fat (lean) meats, and fiber from whole grains, beans, and lots of fruits and vegetables. Home safety  Remove any tripping hazards, such as rugs, cords, and clutter.  Install safety equipment such as grab bars in bathrooms and safety rails on stairs.  Keep rooms and walkways well-lit. Activity   Follow a regular exercise program to stay fit. This will help you maintain your balance. Ask your health care provider what types of exercise are appropriate for you.  If you need a cane or  walker, use it as recommended by your health care provider.  Wear supportive shoes that have nonskid soles. Lifestyle  Do not drink alcohol if your health care provider tells you not to drink.  If you drink alcohol, limit how much you have: ? 0-1 drink a day for women. ? 0-2 drinks a day for men.  Be aware of how much alcohol is in your drink. In the [place] one drink equals one typical bottle of beer (12 oz), one-half glass of wine (5 oz), or one shot of hard liquor (1 oz).  Do not use any products that contain nicotine or tobacco, such as cigarettes and e-cigarettes. If you need help quitting, ask your health care provider. Summary  Having a healthy lifestyle and getting preventive care can help to protect your health and wellness after age 58.  Screening and testing are the best way to find a health problem early and help you avoid having a fall. Early diagnosis and treatment give you the best chance for managing medical conditions that are more common for people who are older than age 60.  Falls are a major cause of broken bones and head injuries in people who are older than age 59. Take precautions to prevent a fall at home.  Work with your health care provider to learn what changes you can make to improve your health and wellness and to prevent falls. This information is not intended to replace advice given to you by your health care provider. Make sure you discuss any questions you have with your health care provider. Document Released: 08/02/2017 Document Revised: 08/02/2017 Document Reviewed: 08/02/2017 Elsevier Interactive Patient Education  2019 [employer].

## 2018-11-15 NOTE — Progress Notes (Signed)
BP 124/70 (BP Location: Left Arm, Patient Position: Sitting, Cuff Size: Normal)   Pulse (!) 57   Temp 98.6 F (37 C) (Oral)   Ht 5' 4.75" (1.645 m)   Wt 125 lb 2 oz (56.8 kg)   SpO2 98%   BMI 20.98 kg/m    CC: AMW f/u visit Subjective:    Patient ID: Alexis Bentley, female    DOB: 1946/01/01, 73 y.o.   MRN: [id number]  HPI: Alexis Bentley is a 73 y.o. female presenting on 11/15/2018 for Annual Exam (Pt 2. )   Saw [name] this week for medicare wellness visit. Note reviewed. Husband recently dx with vasculitis undergoing treatment and recovery.  Preventative: COLONOSCOPY 01/2018 - WNL, no rpt recommended [name]) Well woman exam -withOBGYN Dr. VanDalen11/2016([facility]). He retired. S/p complete hysterectomy. S/p rectal prolapse surgery 2005.  Breast cancer screening - does breast exams at home without concerns. mammogram 12/2017 abnormal s/p reassuring detailed L breast.  DEXA T score -2.8 R femur (12/2017). Reviewed cal/vit D intake, encouraged regular walking. discussed options - would like to monitor for now, with repeat DEXA in 1 year.  Fluyearly  Prevnar 2016, pneumovax 2017  Td 2009 shingrix - discussed Advanced directive: completed at home, asked to bring a copy. Would want husband then youngest son [name]) to be HCPOA.  Seat belt use discussed Sunscreen use discussed. No changing moles on skin. Non smoker Alcohol - none Dentist q6 mo  Eye exam yearly   Married Lives with husband. 2 grown children Daily caffeine use Retired 2013 was [employer]  Activity: walking as well as water therapy with husband  Diet: good water intake, fruits/vegetables daily       Relevant past medical, surgical, family and social history reviewed and updated as indicated. Interim medical history since our last visit reviewed. Allergies and medications reviewed and updated. Outpatient Medications Prior to Visit  Medication Sig Dispense Refill  . apixaban  (ELIQUIS) 5 MG TABS tablet Take 1 tablet (5 mg total) by mouth 2 (two) times daily. 60 tablet 10  . Calcium Carbonate-Vitamin D (CALTRATE 600+D) 600-400 MG-UNIT per tablet Take 1 tablet by mouth 2 (two) times daily.     Marland Kitchen lisinopril (PRINIVIL,ZESTRIL) 20 MG tablet Take 1 tablet (20 mg total) by mouth daily. 90 tablet 3  . metoprolol tartrate (LOPRESSOR) 25 MG tablet Take 1 tablet (25 mg total) by mouth 2 (two) times daily. 8 180 tablet 3  . hydrOXYzine (ATARAX/VISTARIL) 10 MG tablet TAKE 1 TABLET(10 MG) BY MOUTH TWICE DAILY AS NEEDED FOR ANXIETY 30 tablet 0  . omeprazole (PRILOSEC) 40 MG capsule TAKE 1 CAPSULE BY MOUTH EVERY DAY 90 capsule 0  . vitamin B-12 (CYANOCOBALAMIN) 500 MCG tablet Take 500 mcg by mouth every Monday, Wednesday, and Friday.    Marland Kitchen 0.9 %  sodium chloride infusion      No facility-administered medications prior to visit.      Per HPI unless specifically indicated in ROS section below Review of Systems Objective:    BP 124/70 (BP Location: Left Arm, Patient Position: Sitting, Cuff Size: Normal)   Pulse (!) 57   Temp 98.6 F (37 C) (Oral)   Ht 5' 4.75" (1.645 m)   Wt 125 lb 2 oz (56.8 kg)   SpO2 98%   BMI 20.98 kg/m   Wt Readings from Last 3 Encounters:  11/15/18 125 lb 2 oz (56.8 kg)  11/13/18 124 lb 12 oz (56.6 kg)  02/13/18 117 lb (53.1  kg)    Physical Exam Vitals signs and nursing note reviewed.  Constitutional:      General: She is not in acute distress.    Appearance: She is well-developed.  HENT:     Head: Normocephalic and atraumatic.     Right Ear: Hearing, tympanic membrane, ear canal and external ear normal.     Left Ear: Hearing, tympanic membrane, ear canal and external ear normal.     Nose: Nose normal.     Mouth/Throat:     Pharynx: Uvula midline. No oropharyngeal exudate or posterior oropharyngeal erythema.  Eyes:     General: No scleral icterus.    Conjunctiva/sclera: Conjunctivae normal.     Pupils: Pupils are equal, round, and reactive  to light.  Neck:     Musculoskeletal: Normal range of motion and neck supple.  Cardiovascular:     Rate and Rhythm: Normal rate and regular rhythm.     Pulses:          Radial pulses are 2+ on the right side and 2+ on the left side.     Heart sounds: Normal heart sounds. No murmur.  Pulmonary:     Effort: Pulmonary effort is normal. No respiratory distress.     Breath sounds: Normal breath sounds. No wheezing or rales.  Abdominal:     General: Bowel sounds are normal. There is no distension.     Palpations: Abdomen is soft. There is no mass.     Tenderness: There is no abdominal tenderness. There is no guarding or rebound.  Musculoskeletal: Normal range of motion.  Lymphadenopathy:     Cervical: No cervical adenopathy.  Skin:    General: Skin is warm and dry.     Findings: No rash.  Neurological:     Mental Status: She is alert and oriented to person, place, and time.     Comments: CN grossly intact, station and gait intact  Psychiatric:        Behavior: Behavior normal.        Thought Content: Thought content normal.        Judgment: Judgment normal.       Results for orders placed or performed in visit on 11/13/18  TSH  Result Value Ref Range   TSH 1.76 0.35 - 4.50 uIU/mL  Vitamin B12  Result Value Ref Range   Vitamin B-12 >1525 (H) 211 - 911 pg/mL  Vitamin D, 25-hydroxy  Result Value Ref Range   VITD 71.08 30.00 - 100.00 ng/mL  Lipid Panel  Result Value Ref Range   Cholesterol 213 (H) 0 - 200 mg/dL   Triglycerides 121.0 0.0 - 149.0 mg/dL   HDL 73.40 >39.00 mg/dL   VLDL 24.2 0.0 - 40.0 mg/dL   LDL Cholesterol 116 (H) 0 - 99 mg/dL   Total CHOL/HDL Ratio 3    NonHDL 139.82   Comprehensive metabolic panel  Result Value Ref Range   Sodium 138 135 - 145 mEq/L   Potassium 4.4 3.5 - 5.1 mEq/L   Chloride 100 96 - 112 mEq/L   CO2 28 19 - 32 mEq/L   Glucose, Bld 79 70 - 99 mg/dL   BUN 20 6 - 23 mg/dL   Creatinine, Ser 0.98 0.40 - 1.20 mg/dL   Total Bilirubin 0.4 0.2  - 1.2 mg/dL   Alkaline Phosphatase 94 39 - 117 U/L   AST 36 0 - 37 U/L   ALT 31 0 - 35 U/L   Total Protein 7.5 6.0 -  8.3 g/dL   Albumin 4.4 3.5 - 5.2 g/dL   Calcium 10.2 8.4 - 10.5 mg/dL   GFR 56.02 (L) >60.00 mL/min  CBC with Differential/Platelet  Result Value Ref Range   WBC 6.2 4.0 - 10.5 K/uL   RBC 4.35 3.87 - 5.11 Mil/uL   Hemoglobin 13.3 12.0 - 15.0 g/dL   HCT 39.3 36.0 - 46.0 %   MCV 90.3 78.0 - 100.0 fl   MCHC 33.8 30.0 - 36.0 g/dL   RDW 13.4 11.5 - 15.5 %   Platelets 241.0 150.0 - 400.0 K/uL   Neutrophils Relative % 57.3 43.0 - 77.0 %   Lymphocytes Relative 31.8 12.0 - 46.0 %   Monocytes Relative 7.4 3.0 - 12.0 %   Eosinophils Relative 2.3 0.0 - 5.0 %   Basophils Relative 1.2 0.0 - 3.0 %   Neutro Abs 3.5 1.4 - 7.7 K/uL   Lymphs Abs 2.0 0.7 - 4.0 K/uL   Monocytes Absolute 0.5 0.1 - 1.0 K/uL   Eosinophils Absolute 0.1 0.0 - 0.7 K/uL   Basophils Absolute 0.1 0.0 - 0.1 K/uL   Assessment & Plan:   Problem List Items Addressed This Visit    Osteoporosis    Discussed latest DEXA with patient showing progression. Discussed osteoporosis treatment options - she declines for now, would like to reassess with rpt DEXA in 1 year. In interim, will continue calcium/vit D and regular weight bearing exercises.       Low serum vitamin B12    Levels remain elevated - will change vit D to once weekly dosing.       Lone atrial fibrillation (HCC)    Sees EP, appreciate their care. Continue eliquis      HLD (hyperlipidemia)    Chronic, mildly elevated today. Encouraged healthy diet to control lipids. The 10-year ASCVD risk score Mikey Bussing DC Brooke Bonito., et al., 2013) is: 11.4%   Values used to calculate the score:     Age: 31 years     Sex: Female     Is Non-Hispanic African American: No     Diabetic: No     Tobacco smoker: No     Systolic Blood Pressure: 701 mmHg     Is BP treated: Yes     HDL Cholesterol: 73.4 mg/dL     Total Cholesterol: 213 mg/dL       GERD    Chronic, stable.  Continue PPI.       Relevant Medications   omeprazole (PRILOSEC) 40 MG capsule   Essential hypertension    Chronic, stable. Continue current regimen - managed by EP.       Anxiety state    Rare hydroxyzine PRN - refilled today.       Relevant Medications   hydrOXYzine (ATARAX/VISTARIL) 25 MG tablet   Advanced care planning/counseling discussion - Primary    Advanced directive: completed at home, asked to bring a copy. Would want husband then youngest son [name]) to be HCPOA.           Meds ordered this encounter  Medications  . omeprazole (PRILOSEC) 40 MG capsule    Sig: Take 1 capsule (40 mg total) by mouth daily.    Dispense:  90 capsule    Refill:  3  . hydrOXYzine (ATARAX/VISTARIL) 25 MG tablet    Sig: Take 0.5-1 tablets (12.5-25 mg total) by mouth 2 (two) times daily as needed for anxiety.    Dispense:  30 tablet    Refill:  3  .  vitamin B-12 (CYANOCOBALAMIN) 500 MCG tablet    Sig: Take 1 tablet (500 mcg total) by mouth once a week.   No orders of the defined types were placed in this encounter.   Follow up plan: Return in about 1 year (around 11/16/2019) for medicare wellness visit, follow up visit.  Ria Bush, MD

## 2018-11-15 NOTE — Telephone Encounter (Signed)
Received PA request for hydroxyzine 25 mg tab, key:  [id number], PA case ID:  [id number], Rx #:  [id number].  Decision pending.

## 2018-11-15 NOTE — Assessment & Plan Note (Signed)
Chronic, stable.  Continue PPI. 

## 2018-11-15 NOTE — Assessment & Plan Note (Addendum)
Rare hydroxyzine PRN - refilled today.

## 2018-11-22 ENCOUNTER — Other Ambulatory Visit: Payer: Self-pay | Admitting: Nurse Practitioner

## 2018-11-22 NOTE — Telephone Encounter (Signed)
Received faxed PA denial.  Reason is pt needs to have tried 2 of the following alternative drugs:  Buspirone, duloxetine, escitalopram, sertraline or venlafaxine extended-release. Fyi to Dr. Darnell Level.

## 2018-11-23 NOTE — Telephone Encounter (Addendum)
plz notify pt.  Would have her price out self pay - should not be too expensive? Let us know if it is.

## 2018-11-26 NOTE — Telephone Encounter (Signed)
Patient advised. Patient will let us know if this is too expensive

## 2018-12-02 ENCOUNTER — Other Ambulatory Visit: Payer: Self-pay | Admitting: Nurse Practitioner

## 2018-12-02 DIAGNOSIS — I480 Paroxysmal atrial fibrillation: Secondary | ICD-10-CM

## 2018-12-12 ENCOUNTER — Other Ambulatory Visit: Payer: Self-pay | Admitting: Nurse Practitioner

## 2018-12-12 DIAGNOSIS — I480 Paroxysmal atrial fibrillation: Secondary | ICD-10-CM

## 2019-01-03 ENCOUNTER — Other Ambulatory Visit: Payer: Self-pay | Admitting: Nurse Practitioner

## 2019-01-03 DIAGNOSIS — I480 Paroxysmal atrial fibrillation: Secondary | ICD-10-CM

## 2019-01-07 ENCOUNTER — Other Ambulatory Visit: Payer: Self-pay | Admitting: Nurse Practitioner

## 2019-01-07 DIAGNOSIS — I480 Paroxysmal atrial fibrillation: Secondary | ICD-10-CM

## 2019-02-24 ENCOUNTER — Other Ambulatory Visit: Payer: Self-pay | Admitting: Nurse Practitioner

## 2019-02-24 DIAGNOSIS — I480 Paroxysmal atrial fibrillation: Secondary | ICD-10-CM

## 2019-03-12 ENCOUNTER — Telehealth: Payer: Self-pay

## 2019-03-12 NOTE — Telephone Encounter (Signed)
Spoke with pt regarding covid-19 screening prior to appt. Pt stated she does not have symptoms and has not been in contact with anyone who may have covid-19.

## 2019-03-13 ENCOUNTER — Encounter: Payer: Self-pay | Admitting: Nurse Practitioner

## 2019-03-13 ENCOUNTER — Other Ambulatory Visit: Payer: Self-pay

## 2019-03-13 ENCOUNTER — Ambulatory Visit (INDEPENDENT_AMBULATORY_CARE_PROVIDER_SITE_OTHER): Payer: Medicare Other | Admitting: Nurse Practitioner

## 2019-03-13 VITALS — BP 158/80 | HR 62 | Ht 64.0 in | Wt 123.0 lb

## 2019-03-13 DIAGNOSIS — I10 Essential (primary) hypertension: Secondary | ICD-10-CM

## 2019-03-13 DIAGNOSIS — I480 Paroxysmal atrial fibrillation: Secondary | ICD-10-CM

## 2019-03-13 MED ORDER — LISINOPRIL 20 MG PO TABS: ORAL_TABLET | ORAL | 3 refills | Status: DC

## 2019-03-13 MED ORDER — METOPROLOL TARTRATE 25 MG PO TABS: 25.0000 mg | ORAL_TABLET | Freq: Two times a day (BID) | ORAL | 3 refills | Status: DC

## 2019-03-13 NOTE — Progress Notes (Signed)
Electrophysiology Office Note Date: 03/13/2019  ID:  Alexis Bentley, Alexis Bentley [birth year], MRN [id number]  PCP: Ria Bush, MD Electrophysiologist: Rayann Heman  CC: AF follow up  Alexis Bentley is a 73 y.o. female seen today for Dr Rayann Heman.  She presents today for routine electrophysiology followup.  Since last being seen in our clinic, the patient reports doing very well.  She has rare palpitations. She denies chest pain,  dyspnea, PND, orthopnea, nausea, vomiting, dizziness, syncope, edema, weight gain, or early satiety.  Past Medical History:  Diagnosis Date  . Chest pain   . Erosive gastritis   . Gallbladder polyp 10/2016   by [place] 4.60mm  . GERD (gastroesophageal reflux disease)   . Hemorrhoids   . Hiatal hernia   . Hypertension   . Lumbar back pain    With radiculopathy  . Osteoporosis 01/07/2015   T-2.5 hip  . Paroxysmal atrial fibrillation ([place]) 2010   one episode  . Peptic ulcer disease   . PVD (posterior vitreous detachment), bilateral 10/13/2016   Followed by ophtho Dr Kathlen Mody @ [facility]  . Rectal prolapse   . Syncope 03/2015   stable head CT, echo, carotids, labwork, EKG  . Transaminitis 2015   normal iron, viral hep panel, stable abd [place] (benign gallbladder polyp)   Past Surgical History:  Procedure Laterality Date  . cardiac catherization  08/07/2009   NML  . CATARACT EXTRACTION Bilateral 2010  . COLONOSCOPY  12/03/2006   Internal/ external hemorrhoids, no polyps  . COLONOSCOPY  01/2018   WNL, no rpt recommended [name])  . DEXA  03/2015   T -2.5 hip  . ESOPHAGOGASTRODUODENOSCOPY  04/27/2007   Erosive and ulcerative gastritis; small H.H.  . ESOPHAGOGASTRODUODENOSCOPY  10/2016   WNL - sxs attributed to stress [name])  . OOPHORECTOMY    . PARTIAL HYSTERECTOMY  1981   Bleeding, cyst, bladder tack  . RECTAL PROLAPSE REPAIR  2005  . TOTAL ABDOMINAL HYSTERECTOMY  2008   With a bilateral salpingo- oophorectomy    Current Outpatient Medications  Medication  Sig Dispense Refill  . Calcium Carbonate-Vitamin D (CALTRATE 600+D) 600-400 MG-UNIT per tablet Take 1 tablet by mouth 2 (two) times daily.     Marland Kitchen ELIQUIS 5 MG TABS tablet TAKE 1 TABLET BY MOUTH TWICE A DAY 60 tablet 5  . lisinopril (ZESTRIL) 20 MG tablet TAKE 1 TABLET BY MOUTH DAILY. PLEASE SCHEDULE YEARLY APPT WITH AMBER SEILER, NP FOR FUTURE REFILLS! 90 tablet 3  . metoprolol tartrate (LOPRESSOR) 25 MG tablet Take 1 tablet (25 mg total) by mouth 2 (two) times daily. Pt needs to call and make appt before anymore refills. 180 tablet 3  . omeprazole (PRILOSEC) 40 MG capsule Take 1 capsule (40 mg total) by mouth daily. 90 capsule 3  . vitamin B-12 (CYANOCOBALAMIN) 500 MCG tablet Take 1 tablet (500 mcg total) by mouth once a week.     No current facility-administered medications for this visit.     Allergies:   Patient has no known allergies.   Social History: Social History   Socioeconomic History  . Marital status: Married    Spouse name: Not on file  . Number of children: 2  . Years of education: Not on file  . Highest education level: Not on file  Occupational History  . Occupation: retired    Fish farm manager: [employer]  . Financial resource strain: Not on file  . Food insecurity:    Worry: Not on file  Inability: Not on file  . Transportation needs:    Medical: Not on file    Non-medical: Not on file  Tobacco Use  . Smoking status: Never Smoker  . Smokeless tobacco: Never Used  Substance and Sexual Activity  . Alcohol use: No    Alcohol/week: 0.0 standard drinks  . Drug use: No  . Sexual activity: Yes    Birth control/protection: Surgical, Post-menopausal  Lifestyle  . Physical activity:    Days per week: Not on file    Minutes per session: Not on file  . Stress: Not on file  Relationships  . Social connections:    Talks on phone: Not on file    Gets together: Not on file    Attends religious service: Not on file    Active member of club or  organization: Not on file    Attends meetings of clubs or organizations: Not on file    Relationship status: Not on file  . Intimate partner violence:    Fear of current or ex partner: Not on file    Emotionally abused: Not on file    Physically abused: Not on file    Forced sexual activity: Not on file  Other Topics Concern  . Not on file  Social History Narrative   Married   Lives with husband.  2 grown children   Daily caffeine use   Retired 2013 was [employer]   Activity: walking   Diet: good water intake, fruits/vegetables daily    Family History: Family History  Problem Relation Age of Onset  . Hypertension Mother   . COPD Mother        Prior smoker  . Coronary artery disease Mother        stents  . Heart attack Father   . Hypertension Father   . Stroke Father        Ministrokes  . Coronary artery disease Father        x4 stents, Pacer  . Colon cancer Father   . Alzheimer's disease Father   . Heart disease Sister   . Testicular cancer Brother   . Prostate cancer Brother   . Diabetes Mellitus I Son   . Stomach cancer Neg Hx   . Rectal cancer Neg Hx   . Esophageal cancer Neg Hx   . Liver cancer Neg Hx   . Breast cancer Neg Hx     Review of Systems: All other systems reviewed and are otherwise negative except as noted above.   Physical Exam: VS:  BP (!) 158/80   Pulse 62   Ht 5\' 4"  (1.626 m)   Wt 123 lb (55.8 kg)   SpO2 99%   BMI 21.11 kg/m  , BMI Body mass index is 21.11 kg/m. Wt Readings from Last 3 Encounters:  03/13/19 123 lb (55.8 kg)  11/15/18 125 lb 2 oz (56.8 kg)  11/13/18 124 lb 12 oz (56.6 kg)    GEN- The patient is well appearing, alert and oriented x 3 today.   HEENT: normocephalic, atraumatic; sclera clear, conjunctiva pink; hearing intact; oropharynx clear; neck supple  Lungs- Clear to ausculation bilaterally, normal work of breathing.  No wheezes, rales, rhonchi Heart- Regular rate and rhythm  GI- soft, non-tender,  non-distended, bowel sounds present  Extremities- no clubbing, cyanosis, or edema; DP/PT/radial pulses 2+ bilaterally MS- no significant deformity or atrophy Skin- warm and dry, no rash or lesion  Psych- euthymic mood, full affect Neuro- strength and sensation are  intact   EKG:  EKG is ordered today. The ekg ordered today shows sinus rhythm, normal intervals, rate 62  Recent Labs: 11/13/2018: ALT 31; BUN 20; Creatinine, Ser 0.98; Hemoglobin 13.3; Platelets 241.0; Potassium 4.4; Sodium 138; TSH 1.76     Assessment and Plan:  1.  Paroxysmal atrial fibrillation CHADS2VASC is 3 - continue Eliquis CBC, BMET from 11/2018 reviewed Predominately maintaining SR by symptoms We will put her on the list for the REACT-AF study to speak with her when that is available  2.  HTN Blood pressures at home averaging 641 systolic No change required today    Current medicines are reviewed at length with the patient today.   The patient does not have concerns regarding her medicines.  The following changes were made today:  none  Labs/ tests ordered today include: none Orders Placed This Encounter  Procedures  . EKG 12-Lead     Disposition:   Follow up with 1 year with me    Signed, Chanetta Marshall, NP 03/13/2019 10:08 AM   [facility] [address] [place] [place] [address] [phone] (office) [phone] (fax)

## 2019-03-13 NOTE — Patient Instructions (Signed)
Medication Instructions:  none If you need a refill on your cardiac medications before your next appointment, please call your pharmacy.   Lab work: none If you have labs (blood work) drawn today and your tests are completely normal, you will receive your results only by: Marland Kitchen MyChart Message (if you have MyChart) OR . A paper copy in the mail If you have any lab test that is abnormal or we need to change your treatment, we will call you to review the results.  Testing/Procedures: none  Follow-Up: 1 YEAR with Chanetta Marshall, NP At [facility], you and your health needs are our priority.  As part of our continuing mission to provide you with exceptional heart care, we have created designated Provider Care Teams.  These Care Teams include your primary Cardiologist (physician) and Advanced Practice Providers (APPs -  Physician Assistants and Nurse Practitioners) who all work together to provide you with the care you need, when you need it. .   Any Other Special Instructions Will Be Listed Below (If Applicable).

## 2019-03-18 ENCOUNTER — Other Ambulatory Visit: Payer: Self-pay | Admitting: Family Medicine

## 2019-03-18 DIAGNOSIS — Z1231 Encounter for screening mammogram for malignant neoplasm of breast: Secondary | ICD-10-CM

## 2019-03-20 ENCOUNTER — Ambulatory Visit
Admission: RE | Admit: 2019-03-20 | Discharge: 2019-03-20 | Disposition: A | Discharge status: Home / Self Care | Payer: Medicare Other | Source: Ambulatory Visit | Attending: Family Medicine | Admitting: Family Medicine

## 2019-03-20 ENCOUNTER — Other Ambulatory Visit: Payer: Self-pay

## 2019-03-20 DIAGNOSIS — Z1231 Encounter for screening mammogram for malignant neoplasm of breast: Secondary | ICD-10-CM

## 2019-03-20 LAB — HM MAMMOGRAPHY

## 2019-03-21 ENCOUNTER — Encounter: Payer: Self-pay | Admitting: Family Medicine

## 2019-05-18 ENCOUNTER — Other Ambulatory Visit: Payer: Self-pay | Admitting: Nurse Practitioner

## 2019-05-20 NOTE — Telephone Encounter (Signed)
70f 55.8kg Scr 0.98 11/13/18 Lovw/seiler 03/13/19

## 2019-07-24 DIAGNOSIS — Z23 Encounter for immunization: Secondary | ICD-10-CM | POA: Diagnosis not present

## 2019-10-18 DIAGNOSIS — H43813 Vitreous degeneration, bilateral: Secondary | ICD-10-CM | POA: Diagnosis not present

## 2019-10-18 DIAGNOSIS — D3131 Benign neoplasm of right choroid: Secondary | ICD-10-CM | POA: Diagnosis not present

## 2019-10-18 DIAGNOSIS — H26491 Other secondary cataract, right eye: Secondary | ICD-10-CM | POA: Diagnosis not present

## 2019-10-18 DIAGNOSIS — H02403 Unspecified ptosis of bilateral eyelids: Secondary | ICD-10-CM | POA: Diagnosis not present

## 2019-10-18 DIAGNOSIS — Z961 Presence of intraocular lens: Secondary | ICD-10-CM | POA: Diagnosis not present

## 2019-11-14 ENCOUNTER — Other Ambulatory Visit: Payer: Self-pay | Admitting: Family Medicine

## 2019-11-14 DIAGNOSIS — E538 Deficiency of other specified B group vitamins: Secondary | ICD-10-CM

## 2019-11-14 DIAGNOSIS — E785 Hyperlipidemia, unspecified: Secondary | ICD-10-CM

## 2019-11-14 DIAGNOSIS — M810 Age-related osteoporosis without current pathological fracture: Secondary | ICD-10-CM

## 2019-11-14 DIAGNOSIS — I4891 Unspecified atrial fibrillation: Secondary | ICD-10-CM

## 2019-11-15 ENCOUNTER — Ambulatory Visit (INDEPENDENT_AMBULATORY_CARE_PROVIDER_SITE_OTHER): Payer: Medicare Other

## 2019-11-15 ENCOUNTER — Other Ambulatory Visit (INDEPENDENT_AMBULATORY_CARE_PROVIDER_SITE_OTHER): Payer: Medicare Other

## 2019-11-15 ENCOUNTER — Other Ambulatory Visit: Payer: Self-pay

## 2019-11-15 ENCOUNTER — Ambulatory Visit: Payer: Medicare Other

## 2019-11-15 VITALS — BP 143/74 | HR 70

## 2019-11-15 DIAGNOSIS — E785 Hyperlipidemia, unspecified: Secondary | ICD-10-CM | POA: Diagnosis not present

## 2019-11-15 DIAGNOSIS — Z0000 Encounter for general adult medical examination without abnormal findings: Secondary | ICD-10-CM | POA: Diagnosis not present

## 2019-11-15 DIAGNOSIS — E538 Deficiency of other specified B group vitamins: Secondary | ICD-10-CM | POA: Diagnosis not present

## 2019-11-15 DIAGNOSIS — M810 Age-related osteoporosis without current pathological fracture: Secondary | ICD-10-CM

## 2019-11-15 DIAGNOSIS — I4891 Unspecified atrial fibrillation: Secondary | ICD-10-CM

## 2019-11-15 LAB — CBC WITH DIFFERENTIAL/PLATELET
Basophils Absolute: 0.1 10*3/uL (ref 0.0–0.1)
Basophils Relative: 2.2 % (ref 0.0–3.0)
Eosinophils Absolute: 0.2 10*3/uL (ref 0.0–0.7)
Eosinophils Relative: 3.2 % (ref 0.0–5.0)
HCT: 37.8 % (ref 36.0–46.0)
Hemoglobin: 12.6 g/dL (ref 12.0–15.0)
Lymphocytes Relative: 32.8 % (ref 12.0–46.0)
Lymphs Abs: 1.7 10*3/uL (ref 0.7–4.0)
MCHC: 33.4 g/dL (ref 30.0–36.0)
MCV: 92.7 fl (ref 78.0–100.0)
Monocytes Absolute: 0.4 10*3/uL (ref 0.1–1.0)
Monocytes Relative: 7.8 % (ref 3.0–12.0)
Neutro Abs: 2.8 10*3/uL (ref 1.4–7.7)
Neutrophils Relative %: 54 % (ref 43.0–77.0)
Platelets: 269 10*3/uL (ref 150.0–400.0)
RBC: 4.07 Mil/uL (ref 3.87–5.11)
RDW: 13 % (ref 11.5–15.5)
WBC: 5.3 10*3/uL (ref 4.0–10.5)

## 2019-11-15 LAB — COMPREHENSIVE METABOLIC PANEL
ALT: 20 U/L (ref 0–35)
AST: 28 U/L (ref 0–37)
Albumin: 4.4 g/dL (ref 3.5–5.2)
Alkaline Phosphatase: 79 U/L (ref 39–117)
BUN: 21 mg/dL (ref 6–23)
CO2: 31 mEq/L (ref 19–32)
Calcium: 10 mg/dL (ref 8.4–10.5)
Chloride: 102 mEq/L (ref 96–112)
Creatinine, Ser: 0.95 mg/dL (ref 0.40–1.20)
GFR: 57.9 mL/min — ABNORMAL LOW (ref >60.00)
Glucose, Bld: 85 mg/dL (ref 70–99)
Potassium: 4.3 mEq/L (ref 3.5–5.1)
Sodium: 139 mEq/L (ref 135–145)
Total Bilirubin: 0.4 mg/dL (ref 0.2–1.2)
Total Protein: 7.4 g/dL (ref 6.0–8.3)

## 2019-11-15 LAB — VITAMIN B12: Vitamin B-12: 303 pg/mL (ref 211–911)

## 2019-11-15 LAB — LIPID PANEL
Cholesterol: 210 mg/dL — ABNORMAL HIGH (ref 0–200)
HDL: 78.6 mg/dL (ref >39.00)
LDL Cholesterol: 109 mg/dL — ABNORMAL HIGH (ref 0–99)
NonHDL: 131.65
Total CHOL/HDL Ratio: 3
Triglycerides: 114 mg/dL (ref 0.0–149.0)
VLDL: 22.8 mg/dL (ref 0.0–40.0)

## 2019-11-15 LAB — VITAMIN D 25 HYDROXY (VIT D DEFICIENCY, FRACTURES): VITD: 72.08 ng/mL (ref 30.00–100.00)

## 2019-11-15 NOTE — Progress Notes (Signed)
Subjective:   Alexis Bentley is a 74 y.o. female who presents for Medicare Annual (Subsequent) preventive examination.  Review of Systems: N/A   This visit is being conducted through telemedicine via telephone at the nurse health advisor's home address due to the COVID-19 pandemic. This patient has given me verbal consent via doximity to conduct this visit, patient states they are participating from their home address. Patient and myself are on the telephone call. There is no referral for this visit. Some vital signs may be absent or patient reported.    Patient identification: identified by name, DOB, and current address   Cardiac Risk Factors include: advanced age (>53men, >52 women);hypertension;dyslipidemia     Objective:     Vitals: BP (!) 143/74   Pulse 70   There is no height or weight on file to calculate BMI.  Advanced Directives 11/15/2019 11/13/2018 11/08/2017 07/08/2016  Does Patient Have a Medical Advance Directive? Yes No No No  Type of Advance Directive Living will;Healthcare Power of Attorney - - -  Copy of [place] in Chart? No - copy requested - - -  Would patient like information on creating a medical advance directive? - No - Patient declined No - Patient declined Yes - Scientist, clinical (histocompatibility and immunogenetics) given    Tobacco Social History   Tobacco Use  Smoking Status Never Smoker  Smokeless Tobacco Never Used     Counseling given: Not Answered   Clinical Intake:  Pre-visit preparation completed: Yes  Pain : No/denies pain     Nutritional Risks: None Diabetes: No  How often do you need to have someone help you when you read instructions, pamphlets, or other written materials from your doctor or pharmacy?: 1 - Never What is the last grade level you completed in school?: 12th  Interpreter Needed?: No  Information entered by :: CJohnson, LPN  Past Medical History:  Diagnosis Date  . Chest pain   . Erosive gastritis   . Gallbladder polyp  10/2016   by [place] 4.55mm  . GERD (gastroesophageal reflux disease)   . Hemorrhoids   . Hiatal hernia   . Hypertension   . Lumbar back pain    With radiculopathy  . Osteoporosis 01/07/2015   T-2.5 hip  . Paroxysmal atrial fibrillation ([place]) 2010   one episode  . Peptic ulcer disease   . PVD (posterior vitreous detachment), bilateral 10/13/2016   Followed by ophtho Dr Kathlen Mody @ [facility]  . Rectal prolapse   . Syncope 03/2015   stable head CT, echo, carotids, labwork, EKG  . Transaminitis 2015   normal iron, viral hep panel, stable abd [place] (benign gallbladder polyp)   Past Surgical History:  Procedure Laterality Date  . cardiac catherization  08/07/2009   NML  . CATARACT EXTRACTION Bilateral 2010  . COLONOSCOPY  12/03/2006   Internal/ external hemorrhoids, no polyps  . COLONOSCOPY  01/2018   WNL, no rpt recommended [name])  . DEXA  03/2015   T -2.5 hip  . ESOPHAGOGASTRODUODENOSCOPY  04/27/2007   Erosive and ulcerative gastritis; small H.H.  . ESOPHAGOGASTRODUODENOSCOPY  10/2016   WNL - sxs attributed to stress [name])  . OOPHORECTOMY    . PARTIAL HYSTERECTOMY  1981   Bleeding, cyst, bladder tack  . RECTAL PROLAPSE REPAIR  2005  . TOTAL ABDOMINAL HYSTERECTOMY  2008   With a bilateral salpingo- oophorectomy   Family History  Problem Relation Age of Onset  . Hypertension Mother   . COPD Mother  Prior smoker  . Coronary artery disease Mother        stents  . Heart attack Father   . Hypertension Father   . Stroke Father        Ministrokes  . Coronary artery disease Father        x4 stents, Pacer  . Colon cancer Father   . Alzheimer's disease Father   . Heart disease Sister   . Testicular cancer Brother   . Prostate cancer Brother   . Diabetes Mellitus I Son   . Breast cancer Cousin   . Stomach cancer Neg Hx   . Rectal cancer Neg Hx   . Esophageal cancer Neg Hx   . Liver cancer Neg Hx    Social History   Socioeconomic History  . Marital status: Married     Spouse name: Not on file  . Number of children: 2  . Years of education: Not on file  . Highest education level: Not on file  Occupational History  . Occupation: retired    Fish farm manager: [employer]  Tobacco Use  . Smoking status: Never Smoker  . Smokeless tobacco: Never Used  Substance and Sexual Activity  . Alcohol use: No    Alcohol/week: 0.0 standard drinks  . Drug use: No  . Sexual activity: Yes    Birth control/protection: Surgical, Post-menopausal  Other Topics Concern  . Not on file  Social History Narrative   Married   Lives with husband.  2 grown children   Daily caffeine use   Retired 2013 was [employer]   Activity: walking   Diet: good water intake, fruits/vegetables daily   Social Determinants of Radio broadcast assistant Strain: Low Risk   . Difficulty of Paying Living Expenses: Not hard at all  Food Insecurity: No Food Insecurity  . Worried About Charity fundraiser in the Last Year: Never true  . Ran Out of Food in the Last Year: Never true  Transportation Needs: No Transportation Needs  . Lack of Transportation (Medical): No  . Lack of Transportation (Non-Medical): No  Physical Activity: Sufficiently Active  . Days of Exercise per Week: 3 days  . Minutes of Exercise per Session: 60 min  Stress: No Stress Concern Present  . Feeling of Stress : Only a little  Social Connections:   . Frequency of Communication with Friends and Family: Not on file  . Frequency of Social Gatherings with Friends and Family: Not on file  . Attends Religious Services: Not on file  . Active Member of Clubs or Organizations: Not on file  . Attends Archivist Meetings: Not on file  . Marital Status: Not on file    Outpatient Encounter Medications as of 11/15/2019  Medication Sig  . Calcium Carbonate-Vitamin D (CALTRATE 600+D) 600-400 MG-UNIT per tablet Take 1 tablet by mouth 2 (two) times daily.   Marland Kitchen ELIQUIS 5 MG TABS tablet TAKE 1 TABLET BY MOUTH  TWICE A DAY  . lisinopril (ZESTRIL) 20 MG tablet TAKE 1 TABLET BY MOUTH DAILY. PLEASE SCHEDULE YEARLY APPT WITH AMBER SEILER, NP FOR FUTURE REFILLS!  . metoprolol tartrate (LOPRESSOR) 25 MG tablet Take 1 tablet (25 mg total) by mouth 2 (two) times daily. Pt needs to call and make appt before anymore refills.  Marland Kitchen omeprazole (PRILOSEC) 40 MG capsule Take 1 capsule (40 mg total) by mouth daily.  . vitamin B-12 (CYANOCOBALAMIN) 500 MCG tablet Take 1 tablet (500 mcg total) by mouth once a week.  No facility-administered encounter medications on file as of 11/15/2019.    Activities of Daily Living In your present state of health, do you have any difficulty performing the following activities: 11/15/2019  Hearing? N  Vision? N  Difficulty concentrating or making decisions? N  Walking or climbing stairs? N  Dressing or bathing? N  Doing errands, shopping? N  Preparing Food and eating ? N  Using the Toilet? N  In the past six months, have you accidently leaked urine? Y  Comment wears liners  Do you have problems with loss of bowel control? N  Managing your Medications? N  Managing your Finances? N  Housekeeping or managing your Housekeeping? N  Some recent data might be hidden    Patient Care Team: Ria Bush, MD as PCP - General (Family Medicine) Gatha Mayer, MD as Consulting Physician (Gastroenterology) Thompson Grayer, MD as Consulting Physician (Cardiology) Monna Fam, MD as Consulting Physician (Ophthalmology)    Assessment:   This is a routine wellness examination for [facility]  Exercise Activities and Dietary recommendations Current Exercise Habits: Home exercise routine, Type of exercise: walking, Time (Minutes): 60, Frequency (Times/Week): 3, Weekly Exercise (Minutes/Week): 180, Intensity: Moderate, Exercise limited by: None identified  Goals    . Increase physical activity     When schedule permits, I will resume exercising at least 60 minutes 3 days per week.      . Patient Stated     11/15/2019, I will continue walking 3 miles a week.       Fall Risk Fall Risk  11/15/2019 11/13/2018 11/08/2017 07/08/2016 05/19/2015  Falls in the past year? 0 0 No No No  Number falls in past yr: 0 - - - -  Injury with Fall? 0 - - - -  Risk for fall due to : No Fall Risks - - - -  Follow up Falls evaluation completed;Falls prevention discussed - - - -   Is the patient's home free of loose throw rugs in walkways, pet beds, electrical cords, etc?   yes      Grab bars in the bathroom? yes      Handrails on the stairs?   no      Adequate lighting?   yes  Timed Get Up and Go performed: N/A  Depression Screen PHQ 2/9 Scores 11/15/2019 11/13/2018 11/08/2017 07/08/2016  PHQ - 2 Score 0 0 0 0  PHQ- 9 Score 0 0 0 -     Cognitive Function MMSE - Mini Mental State Exam 11/15/2019 11/13/2018 11/08/2017 07/08/2016  Orientation to time 5 5 5 5   Orientation to Place 5 5 5 5   Registration 3 3 3 3   Attention/ Calculation 5 0 0 0  Recall 3 3 3 3   Language- name 2 objects - 0 0 0  Language- repeat 1 1 1 1   Language- follow 3 step command - 3 3 3   Language- read & follow direction - 0 0 0  Write a sentence - 0 0 0  Copy design - 0 0 0  Total score - 20 20 20   Mini Cog  Mini-Cog screen was completed. Maximum score is 22. A value of 0 denotes this part of the MMSE was not completed or the patient failed this part of the Mini-Cog screening.       Immunization History  Administered Date(s) Administered  . Fluad Quad(high Dose 65+) 07/24/2019  . Influenza, High Dose Seasonal PF 07/04/2018  . Influenza,inj,Quad PF,6+ Mos 07/10/2014, 07/02/2015, 07/08/2016, 07/13/2017  .  Influenza-Unspecified 07/03/2013  . Pneumococcal Conjugate-13 05/19/2015  . Pneumococcal Polysaccharide-23 07/08/2016  . Td 10/03/1993, 06/10/2008    Qualifies for Shingles Vaccine: Yes   Screening Tests Health Maintenance  Topic Date Due  . DTAP VACCINES (1) 06/28/1946  . DTaP/Tdap/Td (3 - Tdap) 10/02/2020  (Originally 06/10/2018)  . TETANUS/TDAP  10/02/2020 (Originally 06/10/2018)  . MAMMOGRAM  03/19/2020  . INFLUENZA VACCINE  Completed  . DEXA SCAN  Completed  . Hepatitis C Screening  Completed  . PNA vac Low Risk Adult  Completed  . COLONOSCOPY  Discontinued    Cancer Screenings: Lung: Low Dose CT Chest recommended if Age 48-80 years, 30 pack-year currently smoking OR have quit w/in 15 years. Patient does not qualify. Breast:  Up to date on Mammogram: Yes, completed 03/20/2019   Up to date of Bone Density/Dexa: Yes, completed 12/06/2017 Colorectal: completed 02/13/2018  Additional Screenings:  Hepatitis C Screening: 03/18/2014     Plan:   Patient will continue to walk 3 miles a week.   I have personally reviewed and noted the following in the patient's chart:   . Medical and social history . Use of alcohol, tobacco or illicit drugs  . Current medications and supplements . Functional ability and status . Nutritional status . Physical activity . Advanced directives . List of other physicians . Hospitalizations, surgeries, and ER visits in previous 12 months . Vitals . Screenings to include cognitive, depression, and falls . Referrals and appointments  In addition, I have reviewed and discussed with patient certain preventive protocols, quality metrics, and best practice recommendations. A written personalized care plan for preventive services as well as general preventive health recommendations were provided to patient.     Andrez Grime, LPN  [id number]

## 2019-11-15 NOTE — Patient Instructions (Signed)
Alexis Bentley , Thank you for taking time to come for your Medicare Wellness Visit. I appreciate your ongoing commitment to your health goals. Please review the following plan we discussed and let me know if I can assist you in the future.   Screening recommendations/referrals: Colonoscopy: Up to date, completed 02/13/2018 Mammogram: Up to date, completed 03/20/2019 Bone Density: Up to date, completed 12/06/2017 Recommended yearly ophthalmology/optometry visit for glaucoma screening and checkup Recommended yearly dental visit for hygiene and checkup  Vaccinations: Influenza vaccine: Up to date, completed 07/24/2019 Pneumococcal vaccine: Completed series Tdap vaccine: decline Shingles vaccine: discussed    Advanced directives: Please bring a copy of your POA (Power of Attorney) and/or Living Will to your next appointment.  Conditions/risks identified: hypertension, hyperlipidemia  Next appointment: 11/21/2019 @ 10:30 am    Preventive Care 65 Years and Older, Female Preventive care refers to lifestyle choices and visits with your health care provider that can promote health and wellness. What does preventive care include?  A yearly physical exam. This is also called an annual well check.  Dental exams once or twice a year.  Routine eye exams. Ask your health care provider how often you should have your eyes checked.  Personal lifestyle choices, including:  Daily care of your teeth and gums.  Regular physical activity.  Eating a healthy diet.  Avoiding tobacco and drug use.  Limiting alcohol use.  Practicing safe sex.  Taking low-dose aspirin every day.  Taking vitamin and mineral supplements as recommended by your health care provider. What happens during an annual well check? The services and screenings done by your health care provider during your annual well check will depend on your age, overall health, lifestyle risk factors, and family history of disease. Counseling    Your health care provider may ask you questions about your:  Alcohol use.  Tobacco use.  Drug use.  Emotional well-being.  Home and relationship well-being.  Sexual activity.  Eating habits.  History of falls.  Memory and ability to understand (cognition).  Work and work Statistician.  Reproductive health. Screening  You may have the following tests or measurements:  Height, weight, and BMI.  Blood pressure.  Lipid and cholesterol levels. These may be checked every 5 years, or more frequently if you are over 67 years old.  Skin check.  Lung cancer screening. You may have this screening every year starting at age 45 if you have a 30-pack-year history of smoking and currently smoke or have quit within the past 15 years.  Fecal occult blood test (FOBT) of the stool. You may have this test every year starting at age 81.  Flexible sigmoidoscopy or colonoscopy. You may have a sigmoidoscopy every 5 years or a colonoscopy every 10 years starting at age 69.  Hepatitis C blood test.  Hepatitis B blood test.  Sexually transmitted disease (STD) testing.  Diabetes screening. This is done by checking your blood sugar (glucose) after you have not eaten for a while (fasting). You may have this done every 1-3 years.  Bone density scan. This is done to screen for osteoporosis. You may have this done starting at age 104.  Mammogram. This may be done every 1-2 years. Talk to your health care provider about how often you should have regular mammograms. Talk with your health care provider about your test results, treatment options, and if necessary, the need for more tests. Vaccines  Your health care provider may recommend certain vaccines, such as:  Influenza vaccine. This  is recommended every year.  Tetanus, diphtheria, and acellular pertussis (Tdap, Td) vaccine. You may need a Td booster every 10 years.  Zoster vaccine. You may need this after age 87.  Pneumococcal 13-valent  conjugate (PCV13) vaccine. One dose is recommended after age 43.  Pneumococcal polysaccharide (PPSV23) vaccine. One dose is recommended after age 80. Talk to your health care provider about which screenings and vaccines you need and how often you need them. This information is not intended to replace advice given to you by your health care provider. Make sure you discuss any questions you have with your health care provider. Document Released: 10/16/2015 Document Revised: 06/08/2016 Document Reviewed: 07/21/2015 Elsevier Interactive Patient Education  2017 [place] Prevention in the Home Falls can cause injuries. They can happen to people of all ages. There are many things you can do to make your home safe and to help prevent falls. What can I do on the outside of my home?  Regularly fix the edges of walkways and driveways and fix any cracks.  Remove anything that might make you trip as you walk through a door, such as a raised step or threshold.  Trim any bushes or trees on the path to your home.  Use bright outdoor lighting.  Clear any walking paths of anything that might make someone trip, such as rocks or tools.  Regularly check to see if handrails are loose or broken. Make sure that both sides of any steps have handrails.  Any raised decks and porches should have guardrails on the edges.  Have any leaves, snow, or ice cleared regularly.  Use sand or salt on walking paths during winter.  Clean up any spills in your garage right away. This includes oil or grease spills. What can I do in the bathroom?  Use night lights.  Install grab bars by the toilet and in the tub and shower. Do not use towel bars as grab bars.  Use non-skid mats or decals in the tub or shower.  If you need to sit down in the shower, use a plastic, non-slip stool.  Keep the floor dry. Clean up any water that spills on the floor as soon as it happens.  Remove soap buildup in the tub or  shower regularly.  Attach bath mats securely with double-sided non-slip rug tape.  Do not have throw rugs and other things on the floor that can make you trip. What can I do in the bedroom?  Use night lights.  Make sure that you have a light by your bed that is easy to reach.  Do not use any sheets or blankets that are too big for your bed. They should not hang down onto the floor.  Have a firm chair that has side arms. You can use this for support while you get dressed.  Do not have throw rugs and other things on the floor that can make you trip. What can I do in the kitchen?  Clean up any spills right away.  Avoid walking on wet floors.  Keep items that you use a lot in easy-to-reach places.  If you need to reach something above you, use a strong step stool that has a grab bar.  Keep electrical cords out of the way.  Do not use floor polish or wax that makes floors slippery. If you must use wax, use non-skid floor wax.  Do not have throw rugs and other things on the floor that can make you  trip. What can I do with my stairs?  Do not leave any items on the stairs.  Make sure that there are handrails on both sides of the stairs and use them. Fix handrails that are broken or loose. Make sure that handrails are as long as the stairways.  Check any carpeting to make sure that it is firmly attached to the stairs. Fix any carpet that is loose or worn.  Avoid having throw rugs at the top or bottom of the stairs. If you do have throw rugs, attach them to the floor with carpet tape.  Make sure that you have a light switch at the top of the stairs and the bottom of the stairs. If you do not have them, ask someone to add them for you. What else can I do to help prevent falls?  Wear shoes that:  Do not have high heels.  Have rubber bottoms.  Are comfortable and fit you well.  Are closed at the toe. Do not wear sandals.  If you use a stepladder:  Make sure that it is fully  opened. Do not climb a closed stepladder.  Make sure that both sides of the stepladder are locked into place.  Ask someone to hold it for you, if possible.  Clearly mark and make sure that you can see:  Any grab bars or handrails.  First and last steps.  Where the edge of each step is.  Use tools that help you move around (mobility aids) if they are needed. These include:  Canes.  Walkers.  Scooters.  Crutches.  Turn on the lights when you go into a dark area. Replace any light bulbs as soon as they burn out.  Set up your furniture so you have a clear path. Avoid moving your furniture around.  If any of your floors are uneven, fix them.  If there are any pets around you, be aware of where they are.  Review your medicines with your doctor. Some medicines can make you feel dizzy. This can increase your chance of falling. Ask your doctor what other things that you can do to help prevent falls. This information is not intended to replace advice given to you by your health care provider. Make sure you discuss any questions you have with your health care provider. Document Released: 07/16/2009 Document Revised: 02/25/2016 Document Reviewed: 10/24/2014 Elsevier Interactive Patient Education  2017 [employer].

## 2019-11-15 NOTE — Progress Notes (Signed)
PCP notes:  Health Maintenance: No gaps noted   Abnormal Screenings: none   Patient concerns: none   Nurse concerns: none   Next PCP appt.: 11/21/2019 @ 10:30 am

## 2019-11-21 ENCOUNTER — Other Ambulatory Visit: Payer: Self-pay

## 2019-11-21 ENCOUNTER — Encounter: Payer: Self-pay | Admitting: Family Medicine

## 2019-11-21 ENCOUNTER — Ambulatory Visit (INDEPENDENT_AMBULATORY_CARE_PROVIDER_SITE_OTHER): Payer: Medicare Other | Admitting: Family Medicine

## 2019-11-21 VITALS — BP 132/74 | HR 63

## 2019-11-21 DIAGNOSIS — K219 Gastro-esophageal reflux disease without esophagitis: Secondary | ICD-10-CM

## 2019-11-21 DIAGNOSIS — E538 Deficiency of other specified B group vitamins: Secondary | ICD-10-CM | POA: Diagnosis not present

## 2019-11-21 DIAGNOSIS — I4891 Unspecified atrial fibrillation: Secondary | ICD-10-CM

## 2019-11-21 DIAGNOSIS — I10 Essential (primary) hypertension: Secondary | ICD-10-CM | POA: Diagnosis not present

## 2019-11-21 DIAGNOSIS — Z8711 Personal history of peptic ulcer disease: Secondary | ICD-10-CM | POA: Diagnosis not present

## 2019-11-21 DIAGNOSIS — E785 Hyperlipidemia, unspecified: Secondary | ICD-10-CM | POA: Diagnosis not present

## 2019-11-21 DIAGNOSIS — M810 Age-related osteoporosis without current pathological fracture: Secondary | ICD-10-CM

## 2019-11-21 DIAGNOSIS — Z7189 Other specified counseling: Secondary | ICD-10-CM

## 2019-11-21 MED ORDER — OMEPRAZOLE 40 MG PO CPDR: 40.0000 mg | DELAYED_RELEASE_CAPSULE | Freq: Every day | ORAL | 3 refills | Status: DC

## 2019-11-21 MED ORDER — VITAMIN B-12 500 MCG PO TABS: 500.0000 ug | ORAL_TABLET | ORAL | Status: AC

## 2019-11-21 NOTE — Assessment & Plan Note (Signed)
Advanced directive: completed at home, asked to bring a copy. Would want husband then youngest son (Alexis Bentley) to be HCPOA.  

## 2019-11-21 NOTE — Assessment & Plan Note (Signed)
Again low on weekly replacement - will increase to MWF dosing.

## 2019-11-21 NOTE — Assessment & Plan Note (Addendum)
Chronic, off medication. Reviewed low chol diet. Reviewed benefits of statin - she declines at this time.  The 10-year ASCVD risk score Mikey Bussing DC Brooke Bonito., et al., 2013) is: 14.2%   Values used to calculate the score:     Age: 74 years     Sex: Female     Is Non-Hispanic African American: No     Diabetic: No     Tobacco smoker: No     Systolic Blood Pressure: [id number] mmHg     Is BP treated: Yes     HDL Cholesterol: 78.6 mg/dL     Total Cholesterol: 210 mg/dL

## 2019-11-21 NOTE — Assessment & Plan Note (Signed)
Sees EP, appreciate their care. Continue eliquis.

## 2019-11-21 NOTE — Assessment & Plan Note (Signed)
Chronic, due for updated DEXA. If progression noted, will recommend OP med.

## 2019-11-21 NOTE — Assessment & Plan Note (Signed)
Chronic, stable. Continue current regimen. 

## 2019-11-21 NOTE — Progress Notes (Addendum)
Virtual visit completed through [employer]. Due to national recommendations of social distancing due to COVID-19, a virtual visit is felt to be most appropriate for this patient at this time. Reviewed limitations of a virtual visit.   Patient location: home Provider location: in his home If any vitals were documented, they were collected by patient at home unless specified below.    BP 132/74 (BP Location: Left Arm, Patient Position: Sitting, Cuff Size: Normal)   Pulse 63    CC: AMW f/u visit Subjective:    Patient ID: Alexis Bentley, female    DOB: 08-12-1946, 74 y.o.   MRN: [id number]  HPI: Alexis Bentley is a 74 y.o. female presenting on 11/21/2019 for Annual Exam (No new concerns)   Saw health advisor last week for medicare wellness visit. Note reviewed.    No exam data present    Clinical Support from 11/15/2019 in [place] at [facility] Total Score  0      Fall Risk  11/15/2019 11/13/2018 11/08/2017 07/08/2016 05/19/2015  Falls in the past year? 0 0 No No No  Number falls in past yr: 0 - - - -  Injury with Fall? 0 - - - -  Risk for fall due to : No Fall Risks - - - -  Follow up Falls evaluation completed;Falls prevention discussed - - - -    Preventative: COLONOSCOPY 01/2018 - WNL, no rpt recommended [name]) Well woman exam -withOBGYN last with Dr. VanDalen11/2016([place]). He retired. S/p complete hysterectomy. S/p rectal prolapse surgery 2005.  Breast cancer screening - mammo 03/2019 [place]1. Does breast exams at home without concerns.  DEXA T score -2.8 R femur (12/2017). Reviewed cal/vit D intake, encouraged regular walking. Will order rpt DEXA today.  Lung cancer screen - not eligible Flu shot yearly  Prevnar 2016, pneumovax 2017  Td 2009 [place] covid vaccine 11/2019 Shingrix - discussed  Advanced directive: completed at home, asked to bring a copy. Would want husband then youngest son Alexis Bentley) to be HCPOA.  Seat belt use  discussed Sunscreen use discussed. No changing moles on skin. Non smoker Alcohol - none Dentist q6 mo  Eye exam yearly  Bowel - no diarrhea, constipation Bladder - occasional leaking - wears liner - urge > stress incontinence, not currently bothersome  Married Lives with husband. 2 grown children Daily caffeine use - 3 cups/day in cold weather Retired 2013 from [employer]  Activity: walkingas well as water therapy with husband  Diet: good water intake, fruits/vegetables daily      Relevant past medical, surgical, family and social history reviewed and updated as indicated. Interim medical history since our last visit reviewed. Allergies and medications reviewed and updated. Outpatient Medications Prior to Visit  Medication Sig Dispense Refill  . Calcium Carbonate-Vitamin D (CALTRATE 600+D) 600-400 MG-UNIT per tablet Take 1 tablet by mouth 2 (two) times daily.     Marland Kitchen ELIQUIS 5 MG TABS tablet TAKE 1 TABLET BY MOUTH TWICE A DAY 180 tablet 3  . lisinopril (ZESTRIL) 20 MG tablet TAKE 1 TABLET BY MOUTH DAILY. PLEASE SCHEDULE YEARLY APPT WITH AMBER SEILER, NP FOR FUTURE REFILLS! 90 tablet 3  . metoprolol tartrate (LOPRESSOR) 25 MG tablet Take 1 tablet (25 mg total) by mouth 2 (two) times daily. Pt needs to call and make appt before anymore refills. 180 tablet 3  . omeprazole (PRILOSEC) 40 MG capsule Take 1 capsule (40 mg total) by mouth daily. 90 capsule 3  . vitamin  B-12 (CYANOCOBALAMIN) 500 MCG tablet Take 1 tablet (500 mcg total) by mouth once a week.     No facility-administered medications prior to visit.     Per HPI unless specifically indicated in ROS section below Review of Systems Objective:    BP 132/74 (BP Location: Left Arm, Patient Position: Sitting, Cuff Size: Normal)   Pulse 63   Wt Readings from Last 3 Encounters:  03/13/19 123 lb (55.8 kg)  11/15/18 125 lb 2 oz (56.8 kg)  11/13/18 124 lb 12 oz (56.6 kg)     Physical exam: Gen: alert, NAD, not ill  appearing Pulm: speaks in complete sentences without increased work of breathing Psych: normal mood, normal thought content      Results for orders placed or performed in visit on 11/15/19  CBC with Differential/Platelet  Result Value Ref Range   WBC 5.3 4.0 - 10.5 K/uL   RBC 4.07 3.87 - 5.11 Mil/uL   Hemoglobin 12.6 12.0 - 15.0 g/dL   HCT 37.8 36.0 - 46.0 %   MCV 92.7 78.0 - 100.0 fl   MCHC 33.4 30.0 - 36.0 g/dL   RDW 13.0 11.5 - 15.5 %   Platelets 269.0 150.0 - 400.0 K/uL   Neutrophils Relative % 54.0 43.0 - 77.0 %   Lymphocytes Relative 32.8 12.0 - 46.0 %   Monocytes Relative 7.8 3.0 - 12.0 %   Eosinophils Relative 3.2 0.0 - 5.0 %   Basophils Relative 2.2 0.0 - 3.0 %   Neutro Abs 2.8 1.4 - 7.7 K/uL   Lymphs Abs 1.7 0.7 - 4.0 K/uL   Monocytes Absolute 0.4 0.1 - 1.0 K/uL   Eosinophils Absolute 0.2 0.0 - 0.7 K/uL   Basophils Absolute 0.1 0.0 - 0.1 K/uL  VITAMIN D 25 Hydroxy (Vit-D Deficiency, Fractures)  Result Value Ref Range   VITD 72.08 30.00 - 100.00 ng/mL  Vitamin B12  Result Value Ref Range   Vitamin B-12 303 211 - 911 pg/mL  Comprehensive metabolic panel  Result Value Ref Range   Sodium 139 135 - 145 mEq/L   Potassium 4.3 3.5 - 5.1 mEq/L   Chloride 102 96 - 112 mEq/L   CO2 31 19 - 32 mEq/L   Glucose, Bld 85 70 - 99 mg/dL   BUN 21 6 - 23 mg/dL   Creatinine, Ser 0.95 0.40 - 1.20 mg/dL   Total Bilirubin 0.4 0.2 - 1.2 mg/dL   Alkaline Phosphatase 79 39 - 117 U/L   AST 28 0 - 37 U/L   ALT 20 0 - 35 U/L   Total Protein 7.4 6.0 - 8.3 g/dL   Albumin 4.4 3.5 - 5.2 g/dL   GFR 57.90 (L) >60.00 mL/min   Calcium 10.0 8.4 - 10.5 mg/dL  Lipid panel  Result Value Ref Range   Cholesterol 210 (H) 0 - 200 mg/dL   Triglycerides 114.0 0.0 - 149.0 mg/dL   HDL 78.60 >39.00 mg/dL   VLDL 22.8 0.0 - 40.0 mg/dL   LDL Cholesterol 109 (H) 0 - 99 mg/dL   Total CHOL/HDL Ratio 3    NonHDL 131.65    Assessment & Plan:  I asked her to call us back to schedule 1 year f/u visit.   Problem List Items Addressed This Visit    Osteoporosis    Chronic, due for updated DEXA. If progression noted, will recommend OP med.       Relevant Orders   DG Bone Density   Low serum vitamin B12  Again low on weekly replacement - will increase to MWF dosing.       Lone atrial fibrillation (HCC)    Sees EP, appreciate their care. Continue eliquis.       HLD (hyperlipidemia)    Chronic, off medication. Reviewed low chol diet. Reviewed benefits of statin - she declines at this time.  The 10-year ASCVD risk score Mikey Bussing DC Brooke Bonito., et al., 2013) is: 14.2%   Values used to calculate the score:     Age: 85 years     Sex: Female     Is Non-Hispanic African American: No     Diabetic: No     Tobacco smoker: No     Systolic Blood Pressure: [id number] mmHg     Is BP treated: Yes     HDL Cholesterol: 78.6 mg/dL     Total Cholesterol: 210 mg/dL       History of peptic ulcer disease   GERD    Chronic, stable on daily PPI.  H/o HH, PUD, erosive gastritis on prior EGD      Relevant Medications   omeprazole (PRILOSEC) 40 MG capsule   Essential hypertension    Chronic, stable. Continue current regimen.       Advanced care planning/counseling discussion - Primary    Advanced directive: completed at home, asked to bring a copy. Would want husband then youngest son Alexis Bentley) to be HCPOA.           Meds ordered this encounter  Medications  . omeprazole (PRILOSEC) 40 MG capsule    Sig: Take 1 capsule (40 mg total) by mouth daily.    Dispense:  90 capsule    Refill:  3  . vitamin B-12 (CYANOCOBALAMIN) 500 MCG tablet    Sig: Take 1 tablet (500 mcg total) by mouth every Monday, Wednesday, and Friday.   Orders Placed This Encounter  Procedures  . DG Bone Density    Standing Status:   Future    Standing Expiration Date:   01/18/2021    Order Specific Question:   Reason for Exam (SYMPTOM  OR DIAGNOSIS REQUIRED)    Answer:   osteoporosis f/u    Order Specific Question:   Preferred  imaging location?    Answer:   [place]    I discussed the assessment and treatment plan with the patient. The patient was provided an opportunity to ask questions and all were answered. The patient agreed with the plan and demonstrated an understanding of the instructions. The patient was advised to call back or seek an in-person evaluation if the symptoms worsen or if the condition fails to improve as anticipated.  Follow up plan: No follow-ups on file.  Ria Bush, MD

## 2019-11-21 NOTE — Assessment & Plan Note (Signed)
Chronic, stable on daily PPI. H/o HH, PUD, erosive gastritis on prior EGD.  

## 2019-12-16 ENCOUNTER — Ambulatory Visit
Admission: RE | Admit: 2019-12-16 | Discharge: 2019-12-16 | Disposition: A | Discharge status: Home / Self Care | Payer: Medicare Other | Source: Ambulatory Visit | Attending: Family Medicine | Admitting: Family Medicine

## 2019-12-16 DIAGNOSIS — M810 Age-related osteoporosis without current pathological fracture: Secondary | ICD-10-CM | POA: Insufficient documentation

## 2019-12-16 DIAGNOSIS — Z780 Asymptomatic menopausal state: Secondary | ICD-10-CM | POA: Diagnosis not present

## 2020-03-03 ENCOUNTER — Other Ambulatory Visit: Payer: Self-pay | Admitting: Family Medicine

## 2020-03-03 DIAGNOSIS — Z1231 Encounter for screening mammogram for malignant neoplasm of breast: Secondary | ICD-10-CM

## 2020-03-20 ENCOUNTER — Ambulatory Visit (INDEPENDENT_AMBULATORY_CARE_PROVIDER_SITE_OTHER): Payer: Medicare Other | Admitting: Nurse Practitioner

## 2020-03-20 ENCOUNTER — Encounter: Payer: Self-pay | Admitting: Nurse Practitioner

## 2020-03-20 ENCOUNTER — Other Ambulatory Visit: Payer: Self-pay

## 2020-03-20 VITALS — BP 118/62 | HR 54 | Ht 65.0 in | Wt 113.6 lb

## 2020-03-20 DIAGNOSIS — I10 Essential (primary) hypertension: Secondary | ICD-10-CM | POA: Diagnosis not present

## 2020-03-20 DIAGNOSIS — I480 Paroxysmal atrial fibrillation: Secondary | ICD-10-CM | POA: Diagnosis not present

## 2020-03-20 DIAGNOSIS — Z79899 Other long term (current) drug therapy: Secondary | ICD-10-CM

## 2020-03-20 DIAGNOSIS — D696 Thrombocytopenia, unspecified: Secondary | ICD-10-CM | POA: Diagnosis not present

## 2020-03-20 LAB — CBC WITH DIFFERENTIAL/PLATELET
Basophils Absolute: 0.1 10*3/uL (ref 0.0–0.2)
Basos: 1 %
EOS (ABSOLUTE): 0.1 10*3/uL (ref 0.0–0.4)
Eos: 2 %
Hematocrit: 34.1 % (ref 34.0–46.6)
Hemoglobin: 12 g/dL (ref 11.1–15.9)
Immature Grans (Abs): 0 10*3/uL (ref 0.0–0.1)
Immature Granulocytes: 1 %
Lymphocytes Absolute: 1.7 10*3/uL (ref 0.7–3.1)
Lymphs: 31 %
MCH: 31.2 pg (ref 26.6–33.0)
MCHC: 35.2 g/dL (ref 31.5–35.7)
MCV: 89 fL (ref 79–97)
Monocytes Absolute: 0.4 10*3/uL (ref 0.1–0.9)
Monocytes: 7 %
Neutrophils Absolute: 3.3 10*3/uL (ref 1.4–7.0)
Neutrophils: 58 %
Platelets: 257 10*3/uL (ref 150–450)
RBC: 3.85 x10E6/uL (ref 3.77–5.28)
RDW: 12.6 % (ref 11.7–15.4)
WBC: 5.7 10*3/uL (ref 3.4–10.8)

## 2020-03-20 LAB — BASIC METABOLIC PANEL
BUN/Creatinine Ratio: 23 (ref 12–28)
BUN: 26 mg/dL (ref 8–27)
CO2: 22 mmol/L (ref 20–29)
Calcium: 9.5 mg/dL (ref 8.7–10.3)
Chloride: 101 mmol/L (ref 96–106)
Creatinine, Ser: 1.13 mg/dL — ABNORMAL HIGH (ref 0.57–1.00)
GFR calc Af Amer: 57 mL/min/{1.73_m2} — ABNORMAL LOW (ref >59)
GFR calc non Af Amer: 49 mL/min/{1.73_m2} — ABNORMAL LOW (ref >59)
Glucose: 82 mg/dL (ref 65–99)
Potassium: 4.2 mmol/L (ref 3.5–5.2)
Sodium: 141 mmol/L (ref 134–144)

## 2020-03-20 NOTE — Progress Notes (Signed)
Electrophysiology Office Note Date: 03/20/2020  ID:  Bentley, Alexis Feb 26, 1946, MRN [id number]  PCP: Alexis Bush, MD Electrophysiologist: Alexis Bentley  CC: AF follow up  Alexis Bentley is a 74 y.o. female seen today for Dr Alexis Bentley.  She presents today for routine electrophysiology followup.  Since last being seen in our clinic, the patient reports doing very well.  She denies chest pain, palpitations, dyspnea, PND, orthopnea, nausea, vomiting, dizziness, syncope, edema, weight gain, or early satiety.  Past Medical History:  Diagnosis Date  . Chest pain   . Erosive gastritis   . Gallbladder polyp 10/2016   by [place] 4.35mm  . GERD (gastroesophageal reflux disease)   . Hemorrhoids   . Hiatal hernia   . Hypertension   . Lumbar back pain    With radiculopathy  . Osteoporosis 01/07/2015   T-2.5 hip  . Paroxysmal atrial fibrillation ([place]) 2010   one episode  . Peptic ulcer disease   . PVD (posterior vitreous detachment), bilateral 10/13/2016   Followed by ophtho Dr Kathlen Mody @ [place]  . Rectal prolapse   . Syncope 03/2015   stable head CT, echo, carotids, labwork, EKG  . Transaminitis 2015   normal iron, viral hep panel, stable abd [place] (benign gallbladder polyp)   Past Surgical History:  Procedure Laterality Date  . cardiac catherization  08/07/2009   NML  . CATARACT EXTRACTION Bilateral 2010  . COLONOSCOPY  12/03/2006   Internal/ external hemorrhoids, no polyps  . COLONOSCOPY  01/2018   WNL, no rpt recommended [name])  . DEXA  03/2015   T -2.5 hip  . ESOPHAGOGASTRODUODENOSCOPY  04/27/2007   Erosive and ulcerative gastritis; small H.H.  . ESOPHAGOGASTRODUODENOSCOPY  10/2016   WNL - sxs attributed to stress [name])  . OOPHORECTOMY    . PARTIAL HYSTERECTOMY  1981   Bleeding, cyst, bladder tack  . RECTAL PROLAPSE REPAIR  2005  . TOTAL ABDOMINAL HYSTERECTOMY  2008   With a bilateral salpingo- oophorectomy    Current Outpatient Medications  Medication Sig Dispense  Refill  . Calcium Carbonate-Vitamin D (CALTRATE 600+D) 600-400 MG-UNIT per tablet Take 1 tablet by mouth 2 (two) times daily.     Marland Kitchen ELIQUIS 5 MG TABS tablet TAKE 1 TABLET BY MOUTH TWICE A DAY 180 tablet 3  . lisinopril (ZESTRIL) 20 MG tablet TAKE 1 TABLET BY MOUTH DAILY. PLEASE SCHEDULE YEARLY APPT WITH Leane Loring, NP FOR FUTURE REFILLS! 90 tablet 3  . metoprolol tartrate (LOPRESSOR) 25 MG tablet Take 1 tablet (25 mg total) by mouth 2 (two) times daily. Pt needs to call and make appt before anymore refills. 180 tablet 3  . omeprazole (PRILOSEC) 40 MG capsule Take 1 capsule (40 mg total) by mouth daily. 90 capsule 3  . vitamin B-12 (CYANOCOBALAMIN) 500 MCG tablet Take 1 tablet (500 mcg total) by mouth every Monday, Wednesday, and Friday.     No current facility-administered medications for this visit.    Allergies:   Patient has no known allergies.   Social History: Social History   Socioeconomic History  . Marital status: Married    Spouse name: Not on file  . Number of children: 2  . Years of education: Not on file  . Highest education level: Not on file  Occupational History  . Occupation: retired    Fish farm manager: [employer]  Tobacco Use  . Smoking status: Never Smoker  . Smokeless tobacco: Never Used  Vaping Use  . Vaping Use: Never used  Substance and Sexual Activity  . Alcohol use: No    Alcohol/week: 0.0 standard drinks  . Drug use: No  . Sexual activity: Yes    Birth control/protection: Surgical, Post-menopausal  Other Topics Concern  . Not on file  Social History Narrative   Married   Lives with husband.  2 grown children   Daily caffeine use   Retired 2013 was [employer]   Activity: walking   Diet: good water intake, fruits/vegetables daily   Social Determinants of Radio broadcast assistant Strain: Low Risk   . Difficulty of Paying Living Expenses: Not hard at all  Food Insecurity: No Food Insecurity  . Worried About Sales executive in the Last Year: Never true  . Ran Out of Food in the Last Year: Never true  Transportation Needs: No Transportation Needs  . Lack of Transportation (Medical): No  . Lack of Transportation (Non-Medical): No  Physical Activity: Sufficiently Active  . Days of Exercise per Week: 3 days  . Minutes of Exercise per Session: 60 min  Stress: No Stress Concern Present  . Feeling of Stress : Only a little  Social Connections:   . Frequency of Communication with Friends and Family:   . Frequency of Social Gatherings with Friends and Family:   . Attends Religious Services:   . Active Member of Clubs or Organizations:   . Attends Archivist Meetings:   Marland Kitchen Marital Status:   Intimate Partner Violence: Not At Risk  . Fear of Current or Ex-Partner: No  . Emotionally Abused: No  . Physically Abused: No  . Sexually Abused: No    Family History: Family History  Problem Relation Age of Onset  . Hypertension Mother   . COPD Mother        Prior smoker  . Coronary artery disease Mother        stents  . Heart attack Father   . Hypertension Father   . Stroke Father        Ministrokes  . Coronary artery disease Father        x4 stents, Pacer  . Colon cancer Father   . Alzheimer's disease Father   . Heart disease Sister   . Testicular cancer Brother   . Prostate cancer Brother   . Diabetes Mellitus I Son   . Breast cancer Cousin   . Stomach cancer Neg Hx   . Rectal cancer Neg Hx   . Esophageal cancer Neg Hx   . Liver cancer Neg Hx     Review of Systems: All other systems reviewed and are otherwise negative except as noted above.   Physical Exam: VS:  BP 118/62   Pulse (!) 54   Ht 5\' 5"  (1.651 m)   Wt 113 lb 9.6 oz (51.5 kg)   SpO2 97%   BMI 18.90 kg/m  , BMI Body mass index is 18.9 kg/m. Wt Readings from Last 3 Encounters:  03/20/20 113 lb 9.6 oz (51.5 kg)  03/13/19 123 lb (55.8 kg)  11/15/18 125 lb 2 oz (56.8 kg)    GEN- The patient is well appearing, alert  and oriented x 3 today.   HEENT: normocephalic, atraumatic; sclera clear, conjunctiva pink; hearing intact; oropharynx clear; neck supple  Lungs- Clear to ausculation bilaterally, normal work of breathing.  No wheezes, rales, rhonchi Heart- Regular rate and rhythm  GI- soft, non-tender, non-distended, bowel sounds present  Extremities- no clubbing, cyanosis, or edema  MS- no significant  deformity or atrophy Skin- warm and dry, no rash or lesion  Psych- euthymic mood, full affect Neuro- strength and sensation are intact   EKG:  EKG is ordered today. The ekg ordered today shows sinus brady, rate 54, normal intervals  Recent Labs: 11/15/2019: ALT 20; BUN 21; Creatinine, Ser 0.95; Hemoglobin 12.6; Platelets 269.0; Potassium 4.3; Sodium 139    Assessment and Plan:  1.  Paroxysmal atrial fibrillation Continue Eliquis for CHADS2VASC of 3 Maintaining SR by symptoms CBC, BMET today  2.  HTN Stable No change required today    Current medicines are reviewed at length with the patient today.   The patient does not have concerns regarding her medicines.  The following changes were made today:  none  Labs/ tests ordered today include:  Orders Placed This Encounter  Procedures  . Basic Metabolic Panel (BMET)  . CBC w/Diff  . EKG 12-Lead     Disposition:   Follow up with 1 year with me or Dr Alexis Bentley    Signed, Chanetta Marshall, NP 03/20/2020 10:51 AM   [facility] [address] [place] [address] [phone] (office) [phone] (fax)

## 2020-03-20 NOTE — Patient Instructions (Addendum)
Medication Instructions:  *If you need a refill on your cardiac medications before your next appointment, please call your pharmacy*  Lab Work: Your physician has recommended that you have lab work today: CBC and BMET If you have labs (blood work) drawn today and your tests are completely normal, you will receive your results only by: Marland Kitchen MyChart Message (if you have MyChart) OR . A paper copy in the mail If you have any lab test that is abnormal or we need to change your treatment, we will call you to review the results.  Testing/Procedures: None Ordered  Follow-Up: At [facility], you and your health needs are our priority.  As part of our continuing mission to provide you with exceptional heart care, we have created designated Provider Care Teams.  These Care Teams include your primary Cardiologist (physician) and Advanced Practice Providers (APPs -  Physician Assistants and Nurse Practitioners) who all work together to provide you with the care you need, when you need it.  We recommend signing up for the patient portal called "MyChart".  Sign up information is provided on this After Visit Summary.  MyChart is used to connect with patients for Virtual Visits (Telemedicine).  Patients are able to view lab/test results, encounter notes, upcoming appointments, etc.  Non-urgent messages can be sent to your provider as well.   To learn more about what you can do with MyChart, go to [email or web address].    Your next appointment:   Your physician wants you to follow-up in: 1 YEAR with Chanetta Marshall, NP. You will receive a reminder letter in the mail two months in advance. If you don't receive a letter, please call our office to schedule the follow-up appointment.  The format for your next appointment:   In Person  Provider:   Chanetta Marshall, NP

## 2020-03-24 ENCOUNTER — Telehealth: Payer: Self-pay

## 2020-03-24 ENCOUNTER — Ambulatory Visit
Admission: RE | Admit: 2020-03-24 | Discharge: 2020-03-24 | Disposition: A | Discharge status: Home / Self Care | Payer: Medicare Other | Source: Ambulatory Visit | Attending: Family Medicine | Admitting: Family Medicine

## 2020-03-24 DIAGNOSIS — Z1231 Encounter for screening mammogram for malignant neoplasm of breast: Secondary | ICD-10-CM | POA: Diagnosis present

## 2020-03-24 NOTE — Telephone Encounter (Signed)
Per DPR left VM on mobile phone

## 2020-03-24 NOTE — Telephone Encounter (Signed)
-----   Message from Patsey Berthold, NP sent at 03/20/2020  5:38 PM EDT ----- Please notify patient of stable labs. Encourage adequate hydration

## 2020-03-25 LAB — HM MAMMOGRAPHY

## 2020-03-26 ENCOUNTER — Encounter: Payer: Self-pay | Admitting: Family Medicine

## 2020-04-15 ENCOUNTER — Other Ambulatory Visit: Payer: Self-pay | Admitting: Nurse Practitioner

## 2020-04-15 NOTE — Telephone Encounter (Signed)
Eliquis 5mg  refill request received. Patient is 74 years old, weight-51.5kg, Crea-1.13 on 03/20/2020, Diagnosis-Afib, and last seen by Chanetta Marshall on 03/20/2020. Dose is appropriate based on dosing criteria. Will send in refill to requested pharmacy.

## 2020-04-17 ENCOUNTER — Other Ambulatory Visit: Payer: Self-pay | Admitting: Nurse Practitioner

## 2020-04-17 DIAGNOSIS — I480 Paroxysmal atrial fibrillation: Secondary | ICD-10-CM

## 2020-05-05 DIAGNOSIS — C44319 Basal cell carcinoma of skin of other parts of face: Secondary | ICD-10-CM | POA: Diagnosis not present

## 2020-05-05 DIAGNOSIS — Z85828 Personal history of other malignant neoplasm of skin: Secondary | ICD-10-CM | POA: Diagnosis not present

## 2020-05-27 ENCOUNTER — Other Ambulatory Visit: Payer: Self-pay | Admitting: Nurse Practitioner

## 2020-05-27 DIAGNOSIS — I480 Paroxysmal atrial fibrillation: Secondary | ICD-10-CM

## 2020-06-04 DIAGNOSIS — C44719 Basal cell carcinoma of skin of left lower limb, including hip: Secondary | ICD-10-CM | POA: Diagnosis not present

## 2020-06-04 DIAGNOSIS — L570 Actinic keratosis: Secondary | ICD-10-CM | POA: Diagnosis not present

## 2020-06-04 DIAGNOSIS — Z85828 Personal history of other malignant neoplasm of skin: Secondary | ICD-10-CM | POA: Diagnosis not present

## 2020-06-04 DIAGNOSIS — C4441 Basal cell carcinoma of skin of scalp and neck: Secondary | ICD-10-CM | POA: Diagnosis not present

## 2020-06-04 DIAGNOSIS — C44519 Basal cell carcinoma of skin of other part of trunk: Secondary | ICD-10-CM | POA: Diagnosis not present

## 2020-06-04 DIAGNOSIS — C44319 Basal cell carcinoma of skin of other parts of face: Secondary | ICD-10-CM | POA: Diagnosis not present

## 2020-06-30 DIAGNOSIS — C44719 Basal cell carcinoma of skin of left lower limb, including hip: Secondary | ICD-10-CM | POA: Diagnosis not present

## 2020-06-30 DIAGNOSIS — Z85828 Personal history of other malignant neoplasm of skin: Secondary | ICD-10-CM | POA: Diagnosis not present

## 2020-07-21 DIAGNOSIS — Z23 Encounter for immunization: Secondary | ICD-10-CM | POA: Diagnosis not present

## 2020-07-27 DIAGNOSIS — Z85828 Personal history of other malignant neoplasm of skin: Secondary | ICD-10-CM | POA: Diagnosis not present

## 2020-07-27 DIAGNOSIS — C44519 Basal cell carcinoma of skin of other part of trunk: Secondary | ICD-10-CM | POA: Diagnosis not present

## 2020-09-02 ENCOUNTER — Ambulatory Visit
Admission: EM | Admit: 2020-09-02 | Discharge: 2020-09-02 | Disposition: A | Discharge status: Home / Self Care | Payer: Medicare Other | Attending: Internal Medicine | Admitting: Internal Medicine

## 2020-09-02 DIAGNOSIS — Z1152 Encounter for screening for COVID-19: Secondary | ICD-10-CM | POA: Diagnosis not present

## 2020-09-02 NOTE — ED Notes (Signed)
Pt presents for COVID test only.  Asymptomatic.  °

## 2020-09-04 LAB — SARS-COV-2, NAA 2 DAY TAT

## 2020-09-04 LAB — NOVEL CORONAVIRUS, NAA: SARS-CoV-2, NAA: NOT DETECTED

## 2020-10-12 DIAGNOSIS — Z20828 Contact with and (suspected) exposure to other viral communicable diseases: Secondary | ICD-10-CM | POA: Diagnosis not present

## 2020-10-12 DIAGNOSIS — U071 COVID-19: Secondary | ICD-10-CM | POA: Diagnosis not present

## 2020-10-20 DIAGNOSIS — H02403 Unspecified ptosis of bilateral eyelids: Secondary | ICD-10-CM | POA: Diagnosis not present

## 2020-10-20 DIAGNOSIS — Z961 Presence of intraocular lens: Secondary | ICD-10-CM | POA: Diagnosis not present

## 2020-10-20 DIAGNOSIS — H26491 Other secondary cataract, right eye: Secondary | ICD-10-CM | POA: Diagnosis not present

## 2020-10-20 DIAGNOSIS — D3131 Benign neoplasm of right choroid: Secondary | ICD-10-CM | POA: Diagnosis not present

## 2020-10-20 DIAGNOSIS — H43813 Vitreous degeneration, bilateral: Secondary | ICD-10-CM | POA: Diagnosis not present

## 2020-12-06 ENCOUNTER — Other Ambulatory Visit: Payer: Self-pay | Admitting: Family Medicine

## 2021-01-12 ENCOUNTER — Other Ambulatory Visit: Payer: Self-pay | Admitting: Nurse Practitioner

## 2021-01-12 NOTE — Telephone Encounter (Signed)
Age 75, weight 51.5kg, SCr 1.13 on 03/20/20, afib indication, last visit 03/2020

## 2021-02-23 ENCOUNTER — Other Ambulatory Visit: Payer: Self-pay | Admitting: Family Medicine

## 2021-03-02 ENCOUNTER — Other Ambulatory Visit: Payer: Self-pay

## 2021-03-02 ENCOUNTER — Encounter: Payer: Self-pay | Admitting: Family Medicine

## 2021-03-02 ENCOUNTER — Ambulatory Visit (INDEPENDENT_AMBULATORY_CARE_PROVIDER_SITE_OTHER): Payer: Medicare Other | Admitting: Family Medicine

## 2021-03-02 VITALS — BP 136/84 | HR 62 | Temp 97.5°F | Ht 65.0 in | Wt 113.0 lb

## 2021-03-02 DIAGNOSIS — Z8711 Personal history of peptic ulcer disease: Secondary | ICD-10-CM | POA: Diagnosis not present

## 2021-03-02 DIAGNOSIS — K219 Gastro-esophageal reflux disease without esophagitis: Secondary | ICD-10-CM | POA: Diagnosis not present

## 2021-03-02 DIAGNOSIS — M810 Age-related osteoporosis without current pathological fracture: Secondary | ICD-10-CM | POA: Diagnosis not present

## 2021-03-02 MED ORDER — OMEPRAZOLE 40 MG PO CPDR: 40.0000 mg | DELAYED_RELEASE_CAPSULE | Freq: Every day | ORAL | 3 refills | Status: DC

## 2021-03-02 NOTE — Assessment & Plan Note (Signed)
Likely not good candidate for oral bisphosphonate.

## 2021-03-02 NOTE — Patient Instructions (Addendum)
You should now be able to call and schedule mammogram for next month.  Keep working on calcium in the diet and regular walking routine to keep bones strong.  We discussed different osteoporosis treatment options. Consider Prolia shot as an option for osteoporosis medicine.  Return as needed or in 2023 for wellness visit and follow up.   Osteoporosis  Osteoporosis happens when the bones become thin and less dense than normal. Osteoporosis makes bones more brittle and fragile and more likely to break (fracture). Over time, osteoporosis can cause your bones to become so weak that they fracture after a minor fall. Bones in the hip, wrist, and spine are most likely to fracture due to osteoporosis. What are the causes? The exact cause of this condition is not known. What increases the risk? You are more likely to develop this condition if you:  Have family members with this condition.  Have poor nutrition.  Use the following: ? Steroid medicines, such as prednisone. ? Anti-seizure medicines. ? Nicotine or tobacco, such as cigarettes, e-cigarettes, and chewing tobacco.  Are female.  Are age 73 or older.  Are not physically active (are sedentary).  Are of European or Asian descent.  Have a small body frame. What are the signs or symptoms? A fracture might be the first sign of osteoporosis, especially if the fracture results from a fall or injury that usually would not cause a bone to break. Other signs and symptoms include:  Pain in the neck or low back.  Stooped posture.  Loss of height. How is this diagnosed? This condition may be diagnosed based on:  Your medical history.  A physical exam.  A bone mineral density test, also called a DXA or DEXA test (dual-energy X-ray absorptiometry test). This test uses X-rays to measure the amount of minerals in your bones. How is this treated? This condition may be treated by:  Making lifestyle changes, such as: ? Including foods  with more calcium and vitamin D in your diet. ? Doing weight-bearing and muscle-strengthening exercises. ? Stopping tobacco use. ? Limiting alcohol intake.  Taking medicine to slow the process of bone loss or to increase bone density.  Taking daily supplements of calcium and vitamin D.  Taking hormone replacement medicines, such as estrogen for women and testosterone for men.  Monitoring your levels of calcium and vitamin D. The goal of treatment is to strengthen your bones and lower your risk for a fracture. Follow these instructions at home: Eating and drinking Include calcium and vitamin D in your diet. Calcium is important for bone health, and vitamin D helps your body absorb calcium. Good sources of calcium and vitamin D include:  Certain fatty fish, such as salmon and tuna.  Products that have calcium and vitamin D added to them (are fortified), such as fortified cereals.  Egg yolks.  Cheese.  Liver.   Activity Do exercises as told by your health care provider. Ask your health care provider what exercises and activities are safe for you. You should do:  Exercises that make you work against gravity (weight-bearing exercises), such as tai chi, yoga, or walking.  Exercises to strengthen muscles, such as lifting weights. Lifestyle  Do not drink alcohol if: ? Your health care provider tells you not to drink. ? You are pregnant, may be pregnant, or are planning to become pregnant.  If you drink alcohol: ? Limit how much you use to:  0-1 drink a day for women.  0-2 drinks a day for  men.  Know how much alcohol is in your drink. In the [place] one drink equals one 12 oz bottle of beer (355 mL), one 5 oz glass of wine (148 mL), or one 1 oz glass of hard liquor (44 mL).  Do not use any products that contain nicotine or tobacco, such as cigarettes, e-cigarettes, and chewing tobacco. If you need help quitting, ask your health care provider. Preventing falls  Use devices to  help you move around (mobility aids) as needed, such as canes, walkers, scooters, or crutches.  Keep rooms well-lit and clutter-free.  Remove tripping hazards from walkways, including cords and throw rugs.  Install grab bars in bathrooms and safety rails on stairs.  Use rubber mats in the bathroom and other areas that are often wet or slippery.  Wear closed-toe shoes that fit well and support your feet. Wear shoes that have rubber soles or low heels.  Review your medicines with your health care provider. Some medicines can cause dizziness or changes in blood pressure, which can increase your risk of falling. General instructions  Take over-the-counter and prescription medicines only as told by your health care provider.  Keep all follow-up visits. This is important. Contact a health care provider if:  You have never been screened for osteoporosis and you are: ? A woman who is age 6 or older. ? A man who is age 30 or older. Get help right away if:  You fall or injure yourself. Summary  Osteoporosis is thinning and loss of density in your bones. This makes bones more brittle and fragile and more likely to break (fracture),even with minor falls.  The goal of treatment is to strengthen your bones and lower your risk for a fracture.  Include calcium and vitamin D in your diet. Calcium is important for bone health, and vitamin D helps your body absorb calcium.  Talk with your health care provider about screening for osteoporosis if you are a woman who is age 73 or older, or a man who is age 72 or older. This information is not intended to replace advice given to you by your health care provider. Make sure you discuss any questions you have with your health care provider. Document Revised: 03/05/2020 Document Reviewed: 03/05/2020 Elsevier Patient Education  [place].

## 2021-03-02 NOTE — Assessment & Plan Note (Signed)
Chronic. Discussed last year's DEXA scan. Discussed calcium, vit D intake, and regular weight bearing exercises.  Reviewed osteoporosis treatment options and their risks and benefits, including bisphopshonates, prolia, and estrogenic medications.  She opts out of osteoporosis medication at this time, but if decided to pursue would likely be interested in [place].  She will focus on dietary and lifestyle modifications to control osteoporosis at this time.

## 2021-03-02 NOTE — Progress Notes (Signed)
Patient ID: Alexis Bentley, female    DOB: 04/15/1946, 75 y.o.   MRN: [id number]  This visit was conducted in person.  BP 136/84   Pulse 62   Temp (!) 97.5 F (36.4 C) (Temporal)   Ht 5\' 5"  (1.651 m)   Wt 113 lb (51.3 kg)   SpO2 98%   BMI 18.80 kg/m    CC: discuss mammogram  Subjective:   HPI: Alexis Bentley is a 75 y.o. female presenting on 03/02/2021 for Follow-up (States she was trying to scheduled mammogram but was told she needed to see PCP first. )   Last wellness visit 11/2019.  COVID infection 10/2020. Fully recovered. She has had 3 Pfizer vaccines.   Saw dermatology - had several basal cell cancers removed.   Osteoporosis DEXA 12/2019 - T -0.7 spine, -3.1 R femur, -3.6 L forearm.  She continues calcium/vit D 600/400 1 tab BID.  She does walk regularly 3x/wk.  Not interested in bone strengthening medications.   Last mammogram 03/24/2020 Birads1 @ [place] - rpt 1 yr   Shingrix - has not received. Discussed to check at pharmacy.      Relevant past medical, surgical, family and social history reviewed and updated as indicated. Interim medical history since our last visit reviewed. Allergies and medications reviewed and updated. Outpatient Medications Prior to Visit  Medication Sig Dispense Refill  . Calcium Carbonate-Vitamin D 600-400 MG-UNIT tablet Take 1 tablet by mouth 2 (two) times daily.    Marland Kitchen ELIQUIS 5 MG TABS tablet TAKE 1 TABLET BY MOUTH TWICE A DAY 60 tablet 5  . lisinopril (ZESTRIL) 20 MG tablet TAKE 1 TABLET BY MOUTH DAILY. 90 tablet 3  . metoprolol tartrate (LOPRESSOR) 25 MG tablet Take 1 tablet (25 mg total) by mouth 2 (two) times daily. 180 tablet 3  . vitamin B-12 (CYANOCOBALAMIN) 500 MCG tablet Take 1 tablet (500 mcg total) by mouth every Monday, Wednesday, and Friday.    Marland Kitchen omeprazole (PRILOSEC) 40 MG capsule TAKE 1 CAPSULE BY MOUTH EVERY DAY 90 capsule 1   No facility-administered medications prior to visit.     Per HPI unless specifically  indicated in ROS section below Review of Systems Objective:  BP 136/84   Pulse 62   Temp (!) 97.5 F (36.4 C) (Temporal)   Ht 5\' 5"  (1.651 m)   Wt 113 lb (51.3 kg)   SpO2 98%   BMI 18.80 kg/m   Wt Readings from Last 3 Encounters:  03/02/21 113 lb (51.3 kg)  03/20/20 113 lb 9.6 oz (51.5 kg)  03/13/19 123 lb (55.8 kg)      Physical Exam Vitals and nursing note reviewed.  Constitutional:      Appearance: Normal appearance. She is not ill-appearing.  HENT:     Head: Normocephalic and atraumatic.  Eyes:     Extraocular Movements: Extraocular movements intact.     Conjunctiva/sclera: Conjunctivae normal.     Pupils: Pupils are equal, round, and reactive to light.  Cardiovascular:     Rate and Rhythm: Normal rate and regular rhythm.     Pulses: Normal pulses.     Heart sounds: Normal heart sounds. No murmur heard.     Comments: Sounds regular Pulmonary:     Effort: Pulmonary effort is normal. No respiratory distress.     Breath sounds: Normal breath sounds. No wheezing, rhonchi or rales.  Musculoskeletal:     Right lower leg: No edema.     Left lower leg: No  edema.  Skin:    General: Skin is warm and dry.     Findings: No rash.  Neurological:     Mental Status: She is alert.  Psychiatric:        Mood and Affect: Mood normal.        Behavior: Behavior normal.        Assessment & Plan:  This visit occurred during the SARS-CoV-2 public health emergency.  Safety protocols were in place, including screening questions prior to the visit, additional usage of staff PPE, and extensive cleaning of exam room while observing appropriate contact time as indicated for disinfecting solutions.   She will call to reschedule mammogram at [facility].  Problem List Items Addressed This Visit    GERD    Chronic, stable on daily PPI. H/o HH, PUD, erosive gastritis on prior EGD.       Relevant Medications   omeprazole (PRILOSEC) 40 MG capsule   History of peptic ulcer  disease    Likely not good candidate for oral bisphosphonate.      Osteoporosis - Primary    Chronic. Discussed last year's DEXA scan. Discussed calcium, vit D intake, and regular weight bearing exercises.  Reviewed osteoporosis treatment options and their risks and benefits, including bisphopshonates, prolia, and estrogenic medications.  She opts out of osteoporosis medication at this time, but if decided to pursue would likely be interested in [place].  She will focus on dietary and lifestyle modifications to control osteoporosis at this time.           Meds ordered this encounter  Medications  . omeprazole (PRILOSEC) 40 MG capsule    Sig: Take 1 capsule (40 mg total) by mouth daily.    Dispense:  90 capsule    Refill:  3   No orders of the defined types were placed in this encounter.   Patient instructions: You should now be able to call and schedule mammogram for next month.  Keep working on calcium in the diet and regular walking routine to keep bones strong.  We discussed different osteoporosis treatment options. Consider Prolia shot as an option for osteoporosis medicine.  Return as needed or in 2023 for wellness visit and follow up.   Follow up plan: Return for medicare wellness visit, follow up visit.  Ria Bush, MD

## 2021-03-02 NOTE — Assessment & Plan Note (Signed)
Chronic, stable on daily PPI. H/o HH, PUD, erosive gastritis on prior EGD.

## 2021-03-24 ENCOUNTER — Ambulatory Visit: Payer: Medicare Other | Admitting: Internal Medicine

## 2021-04-08 ENCOUNTER — Other Ambulatory Visit: Payer: Self-pay | Admitting: Internal Medicine

## 2021-04-08 DIAGNOSIS — I480 Paroxysmal atrial fibrillation: Secondary | ICD-10-CM

## 2021-04-09 ENCOUNTER — Other Ambulatory Visit: Payer: Self-pay | Admitting: Family Medicine

## 2021-04-09 DIAGNOSIS — Z1231 Encounter for screening mammogram for malignant neoplasm of breast: Secondary | ICD-10-CM

## 2021-04-11 DIAGNOSIS — Z20822 Contact with and (suspected) exposure to covid-19: Secondary | ICD-10-CM | POA: Diagnosis not present

## 2021-04-16 ENCOUNTER — Other Ambulatory Visit: Payer: Self-pay

## 2021-04-16 ENCOUNTER — Ambulatory Visit
Admission: RE | Admit: 2021-04-16 | Discharge: 2021-04-16 | Disposition: A | Discharge status: Home / Self Care | Payer: Medicare Other | Source: Ambulatory Visit | Attending: Family Medicine | Admitting: Family Medicine

## 2021-04-16 DIAGNOSIS — Z1231 Encounter for screening mammogram for malignant neoplasm of breast: Secondary | ICD-10-CM | POA: Insufficient documentation

## 2021-05-19 ENCOUNTER — Other Ambulatory Visit: Payer: Self-pay

## 2021-05-19 ENCOUNTER — Ambulatory Visit (INDEPENDENT_AMBULATORY_CARE_PROVIDER_SITE_OTHER): Payer: Medicare Other | Admitting: Internal Medicine

## 2021-05-19 VITALS — BP 166/80 | HR 70 | Ht 65.0 in | Wt 113.8 lb

## 2021-05-19 DIAGNOSIS — I480 Paroxysmal atrial fibrillation: Secondary | ICD-10-CM | POA: Diagnosis not present

## 2021-05-19 DIAGNOSIS — I10 Essential (primary) hypertension: Secondary | ICD-10-CM | POA: Diagnosis not present

## 2021-05-19 DIAGNOSIS — I491 Atrial premature depolarization: Secondary | ICD-10-CM | POA: Diagnosis not present

## 2021-05-19 DIAGNOSIS — I493 Ventricular premature depolarization: Secondary | ICD-10-CM | POA: Diagnosis not present

## 2021-05-19 NOTE — Patient Instructions (Addendum)
Medication Instructions:  Your physician recommends that you continue on your current medications as directed. Please refer to the Current Medication list given to you today.  Labwork: None ordered.  Testing/Procedures: None ordered.  Follow-Up: Your physician wants you to follow-up in: 12 months with  James Allred, MD or one of the following Advanced Practice Providers on your designated Care Team:    Renee Ursuy, PA-C    You will receive a reminder letter in the mail two months in advance. If you don't receive a letter, please call our office to schedule the follow-up appointment.   Any Other Special Instructions Will Be Listed Below (If Applicable).  If you need a refill on your cardiac medications before your next appointment, please call your pharmacy.        

## 2021-05-19 NOTE — Progress Notes (Signed)
PCP: Ria Bush, MD   Primary EP: Dr Rayann Heman  Alexis Bentley is a 75 y.o. female who presents today for routine electrophysiology followup.  Since last being seen in our clinic, the patient reports doing very well.  She has rare palpitations, typically lasting a second or two with associated feeling being "weak in the knees".  Today, she denies symptoms of chest pain, shortness of breath,  lower extremity edema, dizziness, presyncope, or syncope.  The patient is otherwise without complaint today.   Past Medical History:  Diagnosis Date   Chest pain    Erosive gastritis    Gallbladder polyp 10/2016   by [place] 4.59m   GERD (gastroesophageal reflux disease)    Hemorrhoids    Hiatal hernia    Hypertension    Lumbar back pain    With radiculopathy   Osteoporosis 01/07/2015   T-2.5 hip   [place]   one episode   Peptic ulcer disease    PVD (posterior vitreous detachment), bilateral 10/13/2016   Followed by ophtho Dr WKathlen Mody@ [facility]   Rectal prolapse    Syncope 03/2015   stable head CT, echo, carotids, labwork, EKG   Transaminitis 2015   normal iron, viral hep panel, stable abd [place](benign gallbladder polyp)   Past Surgical History:  Procedure Laterality Date   cardiac catherization  08/07/2009   NML   CATARACT EXTRACTION Bilateral 2010   COLONOSCOPY  12/03/2006   Internal/ external hemorrhoids, no polyps   COLONOSCOPY  01/2018   WNL, no rpt recommended ([name]   DEXA  03/2015   T -2.5 hip   ESOPHAGOGASTRODUODENOSCOPY  04/27/2007   Erosive and ulcerative gastritis; small H.H.   ESOPHAGOGASTRODUODENOSCOPY  10/2016   WNL - sxs attributed to stress ([name]   [place]  Bleeding, cyst, bladder tack   RECTAL PROLAPSE REPAIR  2005   TOTAL ABDOMINAL HYSTERECTOMY  2008   With a bilateral salpingo- oophorectomy    ROS- all systems are reviewed and negatives except as per HPI above  Current Outpatient  Medications  Medication Sig Dispense Refill   Calcium Carbonate-Vitamin D 600-400 MG-UNIT tablet Take 1 tablet by mouth 2 (two) times daily.     ELIQUIS 5 MG TABS tablet TAKE 1 TABLET BY MOUTH TWICE A DAY 60 tablet 5   lisinopril (ZESTRIL) 20 MG tablet TAKE 1 TABLET BY MOUTH DAILY. 90 tablet 3   metoprolol tartrate (LOPRESSOR) 25 MG tablet TAKE 1 TABLET BY MOUTH TWICE A DAY 180 tablet 3   omeprazole (PRILOSEC) 40 MG capsule Take 1 capsule (40 mg total) by mouth daily. 90 capsule 3   vitamin B-12 (CYANOCOBALAMIN) 500 MCG tablet Take 1 tablet (500 mcg total) by mouth every Monday, Wednesday, and Friday.     No current facility-administered medications for this visit.    Physical Exam: Vitals:   05/19/21 1528  BP: (!) 166/80  Pulse: 70  SpO2: 94%  Weight: 113 lb 12.8 oz (51.6 kg)  Height: '5\' 5"'$  (1.651 m)    GEN- The patient is well appearing, alert and oriented x 3 today.   Head- normocephalic, atraumatic Eyes-  Sclera clear, conjunctiva pink Ears- hearing intact Oropharynx- clear Lungs- Clear to ausculation bilaterally, normal work of breathing Heart- Regular rate and rhythm, no murmurs, rubs or gallops, PMI not laterally displaced GI- soft, NT, ND, + BS Extremities- no clubbing, cyanosis, or edema  Wt Readings from Last 3 Encounters:  05/19/21 113 lb 12.8 oz (51.6 kg)  03/02/21 113 lb (51.3 kg)  03/20/20 113 lb 9.6 oz (51.5 kg)    EKG tracing ordered today is personally reviewed and shows sinus rhythm  Assessment and Plan:  Paroxysmal atrial fibrillation Well controlled Continue eliquis for chads2vasc score of 3 Bmet, cbc today  2. HTN Elevated today  She reports good BP at home and does not wish to make changes at this time Sodium restriction advised Bmet  Check fasting lipids  Risks, benefits and potential toxicities for medications prescribed and/or refilled reviewed with patient today.   Return to see EP APP in a year  Thompson Grayer MD,  [facility] 05/19/2021 3:52 PM

## 2021-05-24 ENCOUNTER — Other Ambulatory Visit: Payer: Medicare Other | Admitting: *Deleted

## 2021-05-24 ENCOUNTER — Other Ambulatory Visit: Payer: Self-pay

## 2021-05-24 DIAGNOSIS — I480 Paroxysmal atrial fibrillation: Secondary | ICD-10-CM | POA: Diagnosis not present

## 2021-05-24 DIAGNOSIS — I491 Atrial premature depolarization: Secondary | ICD-10-CM

## 2021-05-24 DIAGNOSIS — I10 Essential (primary) hypertension: Secondary | ICD-10-CM

## 2021-05-24 DIAGNOSIS — I493 Ventricular premature depolarization: Secondary | ICD-10-CM

## 2021-05-24 LAB — BASIC METABOLIC PANEL
BUN/Creatinine Ratio: 24 (ref 12–28)
BUN: 23 mg/dL (ref 8–27)
CO2: 26 mmol/L (ref 20–29)
Calcium: 10.2 mg/dL (ref 8.7–10.3)
Chloride: 99 mmol/L (ref 96–106)
Creatinine, Ser: 0.95 mg/dL (ref 0.57–1.00)
Glucose: 86 mg/dL (ref 65–99)
Potassium: 4.6 mmol/L (ref 3.5–5.2)
Sodium: 137 mmol/L (ref 134–144)
eGFR: 63 mL/min/{1.73_m2} (ref >59)

## 2021-05-24 LAB — LIPID PANEL
Chol/HDL Ratio: 2.6 ratio (ref 0.0–4.4)
Cholesterol, Total: 217 mg/dL — ABNORMAL HIGH (ref 100–199)
HDL: 83 mg/dL (ref >39)
LDL Chol Calc (NIH): 116 mg/dL — ABNORMAL HIGH (ref 0–99)
Triglycerides: 106 mg/dL (ref 0–149)
VLDL Cholesterol Cal: 18 mg/dL (ref 5–40)

## 2021-05-24 LAB — CBC
Hematocrit: 36.4 % (ref 34.0–46.6)
Hemoglobin: 12.1 g/dL (ref 11.1–15.9)
MCH: 30.9 pg (ref 26.6–33.0)
MCHC: 33.2 g/dL (ref 31.5–35.7)
MCV: 93 fL (ref 79–97)
Platelets: 262 10*3/uL (ref 150–450)
RBC: 3.92 x10E6/uL (ref 3.77–5.28)
RDW: 12.6 % (ref 11.7–15.4)
WBC: 4.8 10*3/uL (ref 3.4–10.8)

## 2021-06-04 ENCOUNTER — Telehealth: Payer: Self-pay | Admitting: Internal Medicine

## 2021-06-04 DIAGNOSIS — I480 Paroxysmal atrial fibrillation: Secondary | ICD-10-CM

## 2021-06-04 MED ORDER — LISINOPRIL 20 MG PO TABS: ORAL_TABLET | ORAL | 3 refills | Status: DC

## 2021-06-04 NOTE — Telephone Encounter (Signed)
Pt's medication was sent to pt's pharmacy as requested. Confirmation received.  °

## 2021-06-04 NOTE — Telephone Encounter (Signed)
*  STAT* If patient is at the pharmacy, call can be transferred to refill team.   1. Which medications need to be refilled? (please list name of each medication and dose if known)  lisinopril (ZESTRIL) 20 MG tablet  2. Which pharmacy/location (including street and city if local pharmacy) is medication to be sent to? [id number]- [place], [place] - [address]  3. Do they need a 30 day or 90 day supply? 90 with refills  Patient is out of medication

## 2021-07-04 ENCOUNTER — Other Ambulatory Visit: Payer: Self-pay | Admitting: Internal Medicine

## 2021-07-05 NOTE — Telephone Encounter (Signed)
Prescription refill request for Eliquis received. Indication: afib  Last office visit: Allred, 05/19/2021 Scr: 0.95, 05/24/2021 Age: 75 yo  Weight: 51.6 kg   Refill sent.

## 2021-07-14 DIAGNOSIS — Z23 Encounter for immunization: Secondary | ICD-10-CM | POA: Diagnosis not present

## 2021-09-09 DIAGNOSIS — Z20822 Contact with and (suspected) exposure to covid-19: Secondary | ICD-10-CM | POA: Diagnosis not present

## 2021-10-09 DIAGNOSIS — U071 COVID-19: Secondary | ICD-10-CM | POA: Diagnosis not present

## 2021-10-14 ENCOUNTER — Ambulatory Visit (INDEPENDENT_AMBULATORY_CARE_PROVIDER_SITE_OTHER): Payer: Medicare Other | Admitting: Nurse Practitioner

## 2021-10-14 ENCOUNTER — Other Ambulatory Visit (INDEPENDENT_AMBULATORY_CARE_PROVIDER_SITE_OTHER): Payer: Medicare Other

## 2021-10-14 ENCOUNTER — Encounter: Payer: Self-pay | Admitting: Nurse Practitioner

## 2021-10-14 VITALS — BP 110/60 | HR 79 | Ht 64.0 in | Wt 117.0 lb

## 2021-10-14 DIAGNOSIS — K645 Perianal venous thrombosis: Secondary | ICD-10-CM

## 2021-10-14 DIAGNOSIS — Z86010 Personal history of colonic polyps: Secondary | ICD-10-CM | POA: Diagnosis not present

## 2021-10-14 DIAGNOSIS — K648 Other hemorrhoids: Secondary | ICD-10-CM

## 2021-10-14 LAB — CBC
HCT: 36.8 % (ref 36.0–46.0)
Hemoglobin: 12.1 g/dL (ref 12.0–15.0)
MCHC: 33 g/dL (ref 30.0–36.0)
MCV: 92.9 fl (ref 78.0–100.0)
Platelets: 236 10*3/uL (ref 150.0–400.0)
RBC: 3.96 Mil/uL (ref 3.87–5.11)
RDW: 13.1 % (ref 11.5–15.5)
WBC: 8.2 10*3/uL (ref 4.0–10.5)

## 2021-10-14 NOTE — Progress Notes (Signed)
10/14/2021 Alexis Bentley [id number] [birth year]   CHIEF COMPLAINT: Rectal bleeding  HISTORY OF PRESENT ILLNESS: Alexis Bentley is a 76 year old female with a past medical history of hypertension, paroxysmal atrial fibrillation on Eliquis GERD and rectal prolapse. She is followed by Dr. [name].  She presents to our office today for further evaluation regarding rectal bleeding.  She was shopping during the Christmas holiday season and was on her feet all day and she passed a normal brown bowel movement with a moderate amount of bright red blood in the toilet water.  No straining or constipation.  She continued to have similar rectal bleeding with each bowel movement for the next 4 to 5 days then the bleeding abruptly stopped.  No further rectal bleeding since then.  She described having minor anal tenderness, no significant anorectal pain.  He remains active.  She drinks 6 to 8 glasses of water daily.  She eats a fairly healthy high-fiber diet.  Her most recent colonoscopy was 02/13/2018 which identified internal and external hemorrhoids, no polyps.  No further colonoscopies were recommended due to her age.  No prior history of colon polyps on any other past colonoscopies.  Father had colon cancer.  EGD 10/18/2016: - The esophagus was normal. - The stomach was normal. - The examined duodenum was normal. - The cardia and gastric fundus were normal on retroflexion.  Colonoscopy 02/13/2018: - External and internal hemorrhoids. - The examination was otherwise normal on direct and retroflexion views. - No specimens collected. - No further colonoscopies due to age  Past Medical History:  Diagnosis Date   Chest pain    Erosive gastritis    Gallbladder polyp 10/2016   by [place] 4.66mm   GERD (gastroesophageal reflux disease)    Hemorrhoids    Hiatal hernia    Hypertension    Lumbar back pain    With radiculopathy   Osteoporosis 01/07/2015   T-2.5 hip   Paroxysmal atrial fibrillation ([place]) 2010    one episode   Peptic ulcer disease    PVD (posterior vitreous detachment), bilateral 10/13/2016   Followed by ophtho Dr Kathlen Mody @ [facility]   Rectal prolapse    Syncope 03/2015   stable head CT, echo, carotids, labwork, EKG   Transaminitis 2015   normal iron, viral hep panel, stable abd [place] (benign gallbladder polyp)   Past Surgical History:  Procedure Laterality Date   cardiac catherization  08/07/2009   NML   CATARACT EXTRACTION Bilateral 2010   COLONOSCOPY  12/03/2006   Internal/ external hemorrhoids, no polyps   COLONOSCOPY  01/2018   WNL, no rpt recommended [name])   DEXA  03/2015   T -2.5 hip   ESOPHAGOGASTRODUODENOSCOPY  04/27/2007   Erosive and ulcerative gastritis; small H.H.   ESOPHAGOGASTRODUODENOSCOPY  10/2016   WNL - sxs attributed to stress [name])   [place]   Bleeding, cyst, bladder tack   RECTAL PROLAPSE REPAIR  2005   TOTAL ABDOMINAL HYSTERECTOMY  2008   With a bilateral salpingo- oophorectomy   Social History: She is married.  She has 1 son.  She is retired.  Non-smoker.  No alcohol use.  She drinks 2 cups of coffee daily.  Family History: family history includes Alzheimer's disease in her father; Breast cancer in her cousin; COPD in her mother; Colon cancer in her father; Coronary artery disease in her father and mother; Diabetes Mellitus I in her son; Heart attack in  her father; Heart disease in her sister; Hypertension in her father and mother; Prostate cancer in her brother; Stroke in her father; Testicular cancer in her brother. No Known Allergies    Outpatient Encounter Medications as of 10/14/2021  Medication Sig   Calcium Carbonate-Vitamin D 600-400 MG-UNIT tablet Take 1 tablet by mouth 2 (two) times daily.   ELIQUIS 5 MG TABS tablet TAKE 1 TABLET BY MOUTH TWICE A DAY   lisinopril (ZESTRIL) 20 MG tablet TAKE 1 TABLET BY MOUTH DAILY.   metoprolol tartrate (LOPRESSOR) 25 MG tablet TAKE 1 TABLET BY MOUTH TWICE A DAY    omeprazole (PRILOSEC) 40 MG capsule Take 1 capsule (40 mg total) by mouth daily.   vitamin B-12 (CYANOCOBALAMIN) 500 MCG tablet Take 1 tablet (500 mcg total) by mouth every Monday, Wednesday, and Friday.   No facility-administered encounter medications on file as of 10/14/2021.   REVIEW OF SYSTEMS:  Gen: Denies fever, sweats or chills. No weight loss.  CV: Denies chest pain, palpitations or edema. Resp: Denies cough, shortness of breath of hemoptysis.  GI: See HPI.  No GERD symptoms.   GU : Denies urinary burning, blood in urine, increased urinary frequency or incontinence. MS: Denies joint pain, muscles aches or weakness. Derm: Denies rash, itchiness, skin lesions or unhealing ulcers. Psych: Denies depression, anxiety or significant memory loss. Heme: Denies bruising, bleeding. Neuro:  Denies headaches, dizziness or paresthesias. Endo:  Denies any problems with DM, thyroid or adrenal function.  PHYSICAL EXAM: Ht 5\' 4"  (1.626 m)    Wt 117 lb (53.1 kg)    BMI 20.08 kg/m  General: 76 year old female in no acute distress. Head: Normocephalic and atraumatic. Eyes:  Sclerae non-icteric, conjunctive pink. Ears: Normal auditory acuity. Mouth: Dentition intact. No ulcers or lesions.  Neck: Supple, no lymphadenopathy or thyromegaly.  Lungs: Clear bilaterally to auscultation without wheezes, crackles or rhonchi. Heart: Regular rate and rhythm. No murmur, rub or gallop appreciated.  Abdomen: Soft, nontender, non distended. No masses. No hepatosplenomegaly. Normoactive bowel sounds x 4 quadrants.  Rectal: Circumferential external hemorrhoids. Inflamed external hemorrhoids with a small area of thrombosis to the right external hemorrhoid.  No active bleeding.  Internal hemorrhoids present without significant prolapse.  No stool or mass in the rectal vault.  [place] present during exam Musculoskeletal: Symmetrical with no gross deformities. Skin: Warm and dry. No rash or lesions on visible  extremities. Extremities: No edema. Neurological: Alert oriented x 4, no focal deficits.  Psychological:  Alert and cooperative. Normal mood and affect.  ASSESSMENT AND PLAN:  68) 76 year old female with internal and external hemorrhoids with recent rectal bleeding, likely due to right external hemorrhoid which was inflamed and friable with a small thrombosis on exam today.  No active bleeding. -Apply a small amount of Desitin inside the anal opening and to the external anal area tid as needed for anal or hemorrhoidal irritation/bleeding.  -Apply a small amount of Vaseline inside the anal opening to the external area prior to exercise or walking or days when standing on feet all day to avoid extreme hemorrhoidal irritation -CBC-patient to call office if rectal bleeding recurs -Follow-up with Dr. Cecilie Kicks in 6 weeks for repeat rectal exam, I do not think her internal hemorrhoids need banding at this point -No plans for diagnostic colonoscopy at this time -Maintain a high-fiber diet, drink 64 ounces of water daily  2) History of atrial fibrillation on Eliquis  3) History of GERD, asymptomatic on Omeprazole 40 mg daily  CC:  Ria Bush, MD

## 2021-10-14 NOTE — Patient Instructions (Signed)
LABS:  Lab work has been ordered for you today. Our lab is located in the basement. Press "B" on the elevator. The lab is located at the first door on the left as you exit the elevator.  HEALTHCARE LAWS AND MY CHART RESULTS: Due to recent changes in healthcare laws, you may see the results of your imaging and laboratory studies on MyChart before your provider has had a chance to review them.   We understand that in some cases there may be results that are confusing or concerning to you. Not all laboratory results come back in the same time frame and the provider may be waiting for multiple results in order to interpret others.  Please give [place] 48 hours in order for your provider to thoroughly review all the results before contacting the office for clarification of your results.   RECOMMENDATIONS: Desitin: Apply a small amount to the external and internal anal area at night for the next 7 days. May use Vaseline to the anorectal area as instructed 2-3 times a day as needed. Follow up with Dr. [name] We have scheduled you a follow up with Dr. [name] on 11/17/21 at 1:30pm.  It was great seeing you today! Thank you for entrusting me with your care and choosing [facility].  Noralyn Pick, CRNP  The [place] [facility] providers would like to encourage you to use [facility] to communicate with providers for non-urgent requests or questions.  Due to long hold times on the telephone, sending your provider a message by [facility] may be faster and more efficient way to get a response. Please allow 48 business hours for a response.  Please remember that this is for non-urgent requests/questions. If you are age 34 or older, your body mass index should be between 23-30. Your Body mass index is 20.08 kg/m. If this is out of the aforementioned range listed, please consider follow up with your Primary Care Provider.  If you are age 62 or younger, your body mass index should be between 19-25. Your Body  mass index is 20.08 kg/m. If this is out of the aformentioned range listed, please consider follow up with your Primary Care Provider.

## 2021-10-19 DIAGNOSIS — H35371 Puckering of macula, right eye: Secondary | ICD-10-CM | POA: Diagnosis not present

## 2021-10-19 DIAGNOSIS — H04123 Dry eye syndrome of bilateral lacrimal glands: Secondary | ICD-10-CM | POA: Diagnosis not present

## 2021-10-19 DIAGNOSIS — H26493 Other secondary cataract, bilateral: Secondary | ICD-10-CM | POA: Diagnosis not present

## 2021-10-19 DIAGNOSIS — D3132 Benign neoplasm of left choroid: Secondary | ICD-10-CM | POA: Diagnosis not present

## 2021-10-19 DIAGNOSIS — H31021 Solar retinopathy, right eye: Secondary | ICD-10-CM | POA: Diagnosis not present

## 2021-11-03 HISTORY — PX: HEMORRHOID BANDING: SHX5850

## 2021-11-17 ENCOUNTER — Encounter: Payer: Self-pay | Admitting: Internal Medicine

## 2021-11-17 ENCOUNTER — Ambulatory Visit (INDEPENDENT_AMBULATORY_CARE_PROVIDER_SITE_OTHER): Payer: Medicare Other | Admitting: Internal Medicine

## 2021-11-17 VITALS — BP 124/62 | HR 64 | Ht 64.75 in | Wt 119.0 lb

## 2021-11-17 DIAGNOSIS — K642 Third degree hemorrhoids: Secondary | ICD-10-CM | POA: Diagnosis not present

## 2021-11-17 DIAGNOSIS — R143 Flatulence: Secondary | ICD-10-CM | POA: Diagnosis not present

## 2021-11-17 DIAGNOSIS — Z7901 Long term (current) use of anticoagulants: Secondary | ICD-10-CM | POA: Diagnosis not present

## 2021-11-17 NOTE — Patient Instructions (Signed)
We provided you with a gas diet book to read and follow.  HEMORRHOID BANDING PROCEDURE    FOLLOW-UP CARE   The procedure you have had should have been relatively painless since the banding of the area involved does not have nerve endings and there is no pain sensation.  The rubber band cuts off the blood supply to the hemorrhoid and the band may fall off as soon as 48 hours after the banding (the band may occasionally be seen in the toilet bowl following a bowel movement). You may notice a temporary feeling of fullness in the rectum which should respond adequately to plain Tylenol or Motrin.  Following the banding, avoid strenuous exercise that evening and resume full activity the next day.  A sitz bath (soaking in a warm tub) or bidet is soothing, and can be useful for cleansing the area after bowel movements.     To avoid constipation, take one tablespoons of natural wheat bran, natural oat bran, flax, Benefiber or any over the counter fiber supplement and increase your water intake to 7-8 glasses daily.    Unless you have been prescribed anorectal medication, do not put anything inside your rectum for two weeks: No suppositories, enemas, fingers, etc.  Occasionally, you may have more bleeding than usual after the banding procedure.  This is often from the untreated hemorrhoids rather than the treated one.  Dont be concerned if there is a tablespoon or so of blood.  If there is more blood than this, lie flat with your bottom higher than your head and apply an ice pack to the area. If the bleeding does not stop within a half an hour or if you feel faint, call our office at [phone] or go to the emergency room.  Problems are not common; however, if there is a substantial amount of bleeding, severe pain, chills, fever or difficulty passing urine (very rare) or other problems, you should call us at [phone] or report to the nearest emergency room.  Do not stay seated continuously  for more than 2-3 hours for a day or two after the procedure.  Tighten your buttock muscles 10-15 times every two hours and take 10-15 deep breaths every 1-2 hours.  Do not spend more than a few minutes on the toilet if you cannot empty your bowel; instead re-visit the toilet at a later time.   I appreciate the opportunity to care for you. Silvano Rusk, MD, [facility]

## 2021-11-17 NOTE — Progress Notes (Signed)
Alexis Bentley 76 y.o. 1945/11/25 [id number]  Assessment & Plan:   Encounter Diagnoses  Name Primary?   Prolapsed internal hemorrhoids, grade 3 Yes   Flatulence    Long term current use of anticoagulant      The following adjunctive treatments were recommended:  Benefiber 1 tablespoon daily  The patient will return in March for  follow-up and possible additional banding as required. No complications were encountered and the patient tolerated the procedure well.  Diet handout regarding gas and flatulence reduction provided to the patient.  This is only been going on 2 weeks so maybe it is just a transient phenomenon.  Subjective:   Chief Complaint: Hemorrhoid follow-up and gas  HPI  76 year old white woman on Eliquis for A-fib here for follow-up after she saw Alexis Bentley with external thrombosed hemorrhoid which was treated locally.  See the note of 10/14/2021 for further details.  She was treated with Desitin and Vaseline.  Bleeding has significantly reduced she has some slight tenderness depending upon activity and has a small amount of bright red blood on toilet paper after defecation today only.  Walking will still aggravate her hemorrhoids.  She is also had a 2-week history of increased borborygmi and gas/flatus with some mild abdominal distention relieved by passage of flatus.  She is tried some Gas-X with mixed results.  No change in medications or diet noted.  Not constipated.  No Known Allergies Current Meds  Medication Sig   Calcium Carbonate-Vitamin D 600-400 MG-UNIT tablet Take 1 tablet by mouth 2 (two) times daily.   ELIQUIS 5 MG TABS tablet TAKE 1 TABLET BY MOUTH TWICE A DAY   lisinopril (ZESTRIL) 20 MG tablet TAKE 1 TABLET BY MOUTH DAILY.   metoprolol tartrate (LOPRESSOR) 25 MG tablet TAKE 1 TABLET BY MOUTH TWICE A DAY   omeprazole (PRILOSEC) 40 MG capsule Take 1 capsule (40 mg total) by mouth daily.   vitamin B-12 (CYANOCOBALAMIN) 500 MCG tablet  Take 1 tablet (500 mcg total) by mouth every Monday, Wednesday, and Friday.   Past Medical History:  Diagnosis Date   Chest pain    Erosive gastritis    Gallbladder polyp 10/2016   by [place] 4.29mm   GERD (gastroesophageal reflux disease)    Hemorrhoids    Hiatal hernia    Hypertension    Lumbar back pain    With radiculopathy   Osteoporosis 01/07/2015   T-2.5 hip   Paroxysmal atrial fibrillation ([place]) 2010   one episode   Peptic ulcer disease    PVD (posterior vitreous detachment), bilateral 10/13/2016   Followed by ophtho Dr Kathlen Mody @ [facility]   Rectal prolapse    Syncope 03/2015   stable head CT, echo, carotids, labwork, EKG   Transaminitis 2015   normal iron, viral hep panel, stable abd [place] (benign gallbladder polyp)   Past Surgical History:  Procedure Laterality Date   cardiac catherization  08/07/2009   NML   CATARACT EXTRACTION Bilateral 2010   COLONOSCOPY  12/03/2006   Internal/ external hemorrhoids, no polyps   COLONOSCOPY  01/2018   WNL, no rpt recommended Alexis Bentley)   DEXA  03/2015   T -2.5 hip   ESOPHAGOGASTRODUODENOSCOPY  04/27/2007   Erosive and ulcerative gastritis; small H.H.   ESOPHAGOGASTRODUODENOSCOPY  10/2016   WNL - sxs attributed to stress Alexis Bentley)   [place]   Bleeding, cyst, bladder tack   RECTAL PROLAPSE REPAIR  2005   TOTAL ABDOMINAL  HYSTERECTOMY  2008   With a bilateral salpingo- oophorectomy   Social History   Social History Narrative   Married   Lives with husband.  2 grown children   Daily caffeine use   Retired 2013 was [employer]   Activity: walking   Diet: good water intake, fruits/vegetables daily   family history includes Alzheimer's disease in her father; Breast cancer in her cousin; COPD in her mother; Colon cancer in her father; Coronary artery disease in her father and mother; Diabetes Mellitus I in her son; Heart attack in her father; Heart disease in her sister; Hypertension in her  father and mother; Prostate cancer in her brother; Stroke in her father; Testicular cancer in her brother.   Review of Systems   Objective:   Physical Exam BP 124/62    Pulse 64    Ht 5' 4.75" (1.645 m)    Wt 119 lb (54 kg)    BMI 19.96 kg/m  Abd flat soft and nontender  Alexis Bentley, CMA present.  Rectal - prolapsed Gr 3 RA internal hemorrhoid w/ heme. Old soft external hemorrhoids all positions  Anoscopy - Gr 2 RP and the grade 3 RA + Gr 1-2 LL   PROCEDURE NOTE: The patient presents with symptomatic grade 2 and 3 hemorrhoids, requesting rubber band ligation of his/her hemorrhoidal disease.  All risks, benefits and alternative forms of therapy were described and informed consent was obtained.   The anorectum was pre-medicated with 0.125% nitroglycerin and 5% lidocaine The decision was made to band the right anterior internal hemorrhoid, and the [place] was used to perform band ligation without complication.  Digital anorectal examination was then performed to assure proper positioning of the band, and to adjust the banded tissue as required.  The patient was discharged home without pain or other issues.  Dietary and behavioral recommendations were given and along with follow-up instructions.

## 2021-11-29 DIAGNOSIS — Z20822 Contact with and (suspected) exposure to covid-19: Secondary | ICD-10-CM | POA: Diagnosis not present

## 2021-12-08 DIAGNOSIS — Z20828 Contact with and (suspected) exposure to other viral communicable diseases: Secondary | ICD-10-CM | POA: Diagnosis not present

## 2021-12-10 ENCOUNTER — Encounter: Payer: Self-pay | Admitting: Internal Medicine

## 2021-12-10 ENCOUNTER — Ambulatory Visit (INDEPENDENT_AMBULATORY_CARE_PROVIDER_SITE_OTHER): Payer: Medicare Other | Admitting: Internal Medicine

## 2021-12-10 VITALS — BP 144/62 | HR 65 | Ht 65.0 in | Wt 120.0 lb

## 2021-12-10 DIAGNOSIS — K642 Third degree hemorrhoids: Secondary | ICD-10-CM | POA: Diagnosis not present

## 2021-12-10 NOTE — Patient Instructions (Signed)
HEMORRHOID BANDING PROCEDURE  ? ? FOLLOW-UP CARE ? ? ?The procedure you have had should have been relatively painless since the banding of the area involved does not have nerve endings and there is no pain sensation.  The rubber band cuts off the blood supply to the hemorrhoid and the band may fall off as soon as 48 hours after the banding (the band may occasionally be seen in the toilet bowl following a bowel movement). You may notice a temporary feeling of fullness in the rectum which should respond adequately to plain Tylenol? or Motrin?. ? ?Following the banding, avoid strenuous exercise that evening and resume full activity the next day.  A sitz bath (soaking in a warm tub) or bidet is soothing, and can be useful for cleansing the area after bowel movements.   ? ? ?To avoid constipation, take two tablespoons of natural wheat bran, natural oat bran, flax, Benefiber? or any over the counter fiber supplement and increase your water intake to 7-8 glasses daily.   ? ?Unless you have been prescribed anorectal medication, do not put anything inside your rectum for two weeks: No suppositories, enemas, fingers, etc. ? ?Occasionally, you may have more bleeding than usual after the banding procedure.  This is often from the untreated hemorrhoids rather than the treated one.  Don?t be concerned if there is a tablespoon or so of blood.  If there is more blood than this, lie flat with your bottom higher than your head and apply an ice pack to the area. If the bleeding does not stop within a half an hour or if you feel faint, call our office at [phone] or go to the emergency room. ? ?Problems are not common; however, if there is a substantial amount of bleeding, severe pain, chills, fever or difficulty passing urine (very rare) or other problems, you should call us at [phone] or report to the nearest emergency room. ? ?Do not stay seated continuously for more than 2-3 hours for a day or two after the procedure.   Tighten your buttock muscles 10-15 times every two hours and take 10-15 deep breaths every 1-2 hours.  Do not spend more than a few minutes on the toilet if you cannot empty your bowel; instead re-visit the toilet at a later time. ? ? Follow up as needed ? ?I appreciate the  opportunity to care for you ? ?Thank You  ? ?Ronney Lion  ?

## 2021-12-10 NOTE — Progress Notes (Signed)
? ?  Patient presents for follow-up of bleeding prolapse grade 3 internal hemorrhoid banded on 11/17/2021.  Her bleeding has stopped though she still senses prolapse. ? ?Genella Mech CMA present ? ?Examination of rectum demonstrates a grade 3 prolapsed right anterior hemorrhoid with some external hemorrhoid/tags again.  It is inflamed but not friable or bleeding.   ? ?Anoscopy confirms the grade 3 prolapsed right anterior hemorrhoid and the other hemorrhoid columns are grade 1. ? ? ? ?PROCEDURE NOTE: ?The patient presents with symptomatic grade 3  hemorrhoids, requesting rubber band ligation of his/her hemorrhoidal disease.  All risks, benefits and alternative forms of therapy were described and informed consent was obtained. ? ? ?The anorectum was pre-medicated with 0.125% nitroglycerin and 5% lidocaine ?The decision was made to band the right anterior internal hemorrhoid, and the [place] O?Regan System was used to perform band ligation without complication.  ?Digital anorectal examination was then performed to assure proper positioning of the band, and to adjust the banded tissue as required. ? The patient was discharged home without pain or other issues.  Dietary and behavioral recommendations were given and along with follow-up instructions.   ?  ?The following adjunctive treatments were recommended: ? ?Benefiber 1 tablespoon daily ? ?The patient will return as needed for  follow-up and possible additional banding as required. ?No complications were encountered and the patient tolerated the procedure well. ? ?

## 2021-12-15 DIAGNOSIS — Z20822 Contact with and (suspected) exposure to covid-19: Secondary | ICD-10-CM | POA: Diagnosis not present

## 2021-12-21 DIAGNOSIS — Z20822 Contact with and (suspected) exposure to covid-19: Secondary | ICD-10-CM | POA: Diagnosis not present

## 2022-01-06 DIAGNOSIS — Z20822 Contact with and (suspected) exposure to covid-19: Secondary | ICD-10-CM | POA: Diagnosis not present

## 2022-01-07 ENCOUNTER — Other Ambulatory Visit: Payer: Self-pay | Admitting: Internal Medicine

## 2022-01-10 NOTE — Telephone Encounter (Signed)
Eliquis 5 mg refill request received. Patient is 76 years old, weight-54.4 kg, Crea- 0.95 on 05/24/21 , Diagnosis- PAF, and last seen by Dr. Rayann Heman on 05/19/21. Dose is appropriate based on dosing criteria. Will send in refill to requested pharmacy.   ?

## 2022-01-17 DIAGNOSIS — Z20822 Contact with and (suspected) exposure to covid-19: Secondary | ICD-10-CM | POA: Diagnosis not present

## 2022-02-05 DIAGNOSIS — Z20822 Contact with and (suspected) exposure to covid-19: Secondary | ICD-10-CM | POA: Diagnosis not present

## 2022-02-07 DIAGNOSIS — Z20822 Contact with and (suspected) exposure to covid-19: Secondary | ICD-10-CM | POA: Diagnosis not present

## 2022-02-28 ENCOUNTER — Other Ambulatory Visit: Payer: Self-pay | Admitting: Family Medicine

## 2022-03-15 ENCOUNTER — Ambulatory Visit: Payer: Medicare Other

## 2022-03-15 ENCOUNTER — Telehealth: Payer: Self-pay

## 2022-03-15 ENCOUNTER — Ambulatory Visit (INDEPENDENT_AMBULATORY_CARE_PROVIDER_SITE_OTHER): Payer: Medicare Other

## 2022-03-15 VITALS — Ht 64.75 in | Wt 120.0 lb

## 2022-03-15 DIAGNOSIS — Z1231 Encounter for screening mammogram for malignant neoplasm of breast: Secondary | ICD-10-CM

## 2022-03-15 DIAGNOSIS — Z0000 Encounter for general adult medical examination without abnormal findings: Secondary | ICD-10-CM | POA: Diagnosis not present

## 2022-03-15 DIAGNOSIS — Z780 Asymptomatic menopausal state: Secondary | ICD-10-CM

## 2022-03-15 NOTE — Progress Notes (Signed)
Subjective:   Alexis Bentley is a 76 y.o. female who presents for Medicare Annual (Subsequent) preventive examination. Virtual Visit via Telephone Note  I connected with  Alexis Bentley on 03/15/22 at 12:00 PM EDT by telephone and verified that I am speaking with the correct person using two identifiers.  Location: Patient: HOME Provider: [facility] Persons participating in the virtual visit: patient/Nurse Health Advisor   I discussed the limitations, risks, security and privacy concerns of performing an evaluation and management service by telephone and the availability of in person appointments. The patient expressed understanding and agreed to proceed.  Interactive audio and video telecommunications were attempted between this nurse and patient, however failed, due to patient having technical difficulties OR patient did not have access to video capability.  We continued and completed visit with audio only.  Some vital signs may be absent or patient reported.   Chriss Driver, LPN  Review of Systems     Cardiac Risk Factors include: advanced age (>49mn, >>44women);hypertension;dyslipidemia;sedentary lifestyle     Objective:    Today's Vitals   03/15/22 1201  Weight: 120 lb (54.4 kg)  Height: 5' 4.75" (1.645 m)   Body mass index is 20.12 kg/m.     03/15/2022   12:06 PM 11/15/2019    2:49 PM 11/13/2018   10:54 AM 11/08/2017   11:56 AM 07/08/2016   11:45 AM  Advanced Directives  Does Patient Have a Medical Advance Directive? Yes Yes No No No  Type of AParamedicof AVadoLiving will Living will;Healthcare Power of [place]in Chart? No - copy requested No - copy requested     Would patient like information on creating a medical advance directive?   No - Patient declined No - Patient declined Yes - Educational materials given    Current Medications (verified) Outpatient Encounter Medications as of 03/15/2022   Medication Sig   Calcium Carbonate-Vitamin D 600-400 MG-UNIT tablet Take 1 tablet by mouth 2 (two) times daily.   ELIQUIS 5 MG TABS tablet TAKE 1 TABLET BY MOUTH TWICE A DAY   lisinopril (ZESTRIL) 20 MG tablet TAKE 1 TABLET BY MOUTH DAILY.   metoprolol tartrate (LOPRESSOR) 25 MG tablet TAKE 1 TABLET BY MOUTH TWICE A DAY   omeprazole (PRILOSEC) 40 MG capsule TAKE 1 CAPSULE (40 MG TOTAL) BY MOUTH DAILY.   vitamin B-12 (CYANOCOBALAMIN) 500 MCG tablet Take 1 tablet (500 mcg total) by mouth every Monday, Wednesday, and Friday.   No facility-administered encounter medications on file as of 03/15/2022.    Allergies (verified) Patient has no known allergies.   History: Past Medical History:  Diagnosis Date   Chest pain    Erosive gastritis    Gallbladder polyp 10/2016   by [place]4.578[place]  GERD (gastroesophageal reflux disease)    Hemorrhoids    Hiatal hernia    Hypertension    Lumbar back pain    With radiculopathy   Osteoporosis 01/07/2015   T-2.5 hip   [place]   one episode   Peptic ulcer disease    PVD (posterior vitreous detachment), bilateral 10/13/2016   Followed by ophtho Dr WeKathlen Mody [place]   Rectal prolapse    Syncope 03/2015   stable head CT, echo, carotids, labwork, EKG   Transaminitis 2015   normal iron, viral hep panel, stable abd [place] gallbladder polyp)   Past Surgical History:  Procedure  Laterality Date   cardiac catherization  08/07/2009   NML   CATARACT EXTRACTION Bilateral 2010   COLONOSCOPY  12/03/2006   Internal/ external hemorrhoids, no polyps   COLONOSCOPY  01/2018   WNL, no rpt recommended [name])   DEXA  03/2015   T -2.5 hip   ESOPHAGOGASTRODUODENOSCOPY  04/27/2007   Erosive and ulcerative gastritis; small H.H.   ESOPHAGOGASTRODUODENOSCOPY  10/2016   WNL - sxs attributed to stress [name])   HEMORRHOID BANDING  11/2021   Repeat March 2023   OOPHORECTOMY     PARTIAL HYSTERECTOMY  1981   Bleeding, cyst, bladder  tack   RECTAL PROLAPSE REPAIR  2005   TOTAL ABDOMINAL HYSTERECTOMY  2008   With a bilateral salpingo- oophorectomy   Family History  Problem Relation Age of Onset   Hypertension Mother    COPD Mother        Prior smoker   Coronary artery disease Mother        stents   Heart attack Father    Hypertension Father    Stroke Father        Ministrokes   Coronary artery disease Father        x4 stents, Pacer   Colon cancer Father    Alzheimer's disease Father    Heart disease Sister    Testicular cancer Brother    Prostate cancer Brother    Diabetes Mellitus I Son    Breast cancer Cousin    Stomach cancer Neg Hx    Rectal cancer Neg Hx    Esophageal cancer Neg Hx    Liver cancer Neg Hx    Social History   Socioeconomic History   Marital status: Married    Spouse name: Not on file   Number of children: 2   Years of education: Not on file   Highest education level: Not on file  Occupational History   Occupation: retired    Fish farm manager: [employer]  Tobacco Use   Smoking status: Never   Smokeless tobacco: Never  Vaping Use   Vaping Use: Never used  Substance and Sexual Activity   Alcohol use: No    Alcohol/week: 0.0 standard drinks of alcohol   Drug use: No   Sexual activity: Yes    Birth control/protection: Surgical, Post-menopausal  Other Topics Concern   Not on file  Social History Narrative   Married   Lives with husband.  2 grown children   Daily caffeine use   Retired 2013 was [place]   Activity: walking   Diet: good water intake, fruits/vegetables daily   Social Determinants of Health   Financial Resource Strain: Low Risk  (03/15/2022)   Overall Financial Resource Strain (CARDIA)    Difficulty of Paying Living Expenses: Not hard at all  Food Insecurity: No Food Insecurity (03/15/2022)   Hunger Vital Sign    Worried About Running Out of Food in the Last Year: Never true    Ran Out of Food in the Last Year: Never true   Transportation Needs: No Transportation Needs (03/15/2022)   PRAPARE - Hydrologist (Medical): No    Lack of Transportation (Non-Medical): No  Physical Activity: Insufficiently Active (03/15/2022)   Exercise Vital Sign    Days of Exercise per Week: 2 days    Minutes of Exercise per Session: 30 min  Stress: No Stress Concern Present (03/15/2022)   [place]    Feeling  of Stress : Not at all  Social Connections: Socially Integrated (03/15/2022)   Social Connection and Isolation Panel [NHANES]    Frequency of Communication with Friends and Family: More than three times a week    Frequency of Social Gatherings with Friends and Family: More than three times a week    Attends Religious Services: More than 4 times per year    Active Member of [employer] or Organizations: Yes    Attends Music therapist: More than 4 times per year    Marital Status: Married    Tobacco Counseling Counseling given: Not Answered   Clinical Intake:  Pre-visit preparation completed: Yes  Pain : No/denies pain     BMI - recorded: 20.12 Nutritional Status: BMI of 19-24  Normal Nutritional Risks: None Diabetes: No  How often do you need to have someone help you when you read instructions, pamphlets, or other written materials from your doctor or pharmacy?: 1 - Never  Diabetic?no  Interpreter Needed?: No  Information entered by :: mj Risha Barretta, lpn   Activities of Daily Living    03/15/2022   12:07 PM  In your present state of health, do you have any difficulty performing the following activities:  Hearing? 0  Vision? 0  Difficulty concentrating or making decisions? 0  Walking or climbing stairs? 0  Dressing or bathing? 0  Doing errands, shopping? 0  Preparing Food and eating ? N  Using the Toilet? N  In the past six months, have you accidently leaked urine? N  Do you have problems with loss of  bowel control? N  Managing your Medications? N  Managing your Finances? N  Housekeeping or managing your Housekeeping? N    Patient Care Team: Ria Bush, MD as PCP - General (Family Medicine) Gatha Mayer, MD as Consulting Physician (Gastroenterology) Thompson Grayer, MD as Consulting Physician (Cardiology) Monna Fam, MD as Consulting Physician (Ophthalmology)  Indicate any recent Medical Services you may have received from other than [facility] providers in the past year (date may be approximate).     Assessment:   This is a routine wellness examination for [facility].  Hearing/Vision screen Hearing Screening - Comments:: No hearing issues.  Vision Screening - Comments:: Readers. [facility]. 12/2021.  Dietary issues and exercise activities discussed: Current Exercise Habits: Home exercise routine, Type of exercise: walking, Time (Minutes): 30, Frequency (Times/Week): 2, Weekly Exercise (Minutes/Week): 60, Intensity: Mild, Exercise limited by: cardiac condition(s);orthopedic condition(s)   Goals Addressed             This Visit's Progress    Increase physical activity   Not on track    When schedule permits, I will resume exercising at least 60 minutes 3 days per week.      Patient Stated   Not on track    11/15/2019, I will continue walking 3 miles a week.       Depression Screen    03/15/2022   12:04 PM 11/15/2019    2:52 PM 11/13/2018   10:36 AM 11/08/2017   11:35 AM 07/08/2016   11:47 AM 05/19/2015    8:36 AM  PHQ 2/9 Scores  PHQ - 2 Score 0 0 0 0 0 0  PHQ- 9 Score  0 0 0      Fall Risk    03/15/2022   12:06 PM 11/15/2019    2:50 PM 11/13/2018   10:36 AM 11/08/2017   11:35 AM 07/08/2016   11:47 AM  Fall Risk  Falls in the past year? 0 0 0 No No  Number falls in past yr: 0 0     Injury with Fall? 0 0     Risk for fall due to : No Fall Risks No Fall Risks     Follow up Falls prevention discussed Falls evaluation completed;Falls prevention discussed        FALL RISK PREVENTION PERTAINING TO THE HOME:  Any stairs in or around the home? Yes  If so, are there any without handrails? No  Home free of loose throw rugs in walkways, pet beds, electrical cords, etc? Yes  Adequate lighting in your home to reduce risk of falls? Yes   ASSISTIVE DEVICES UTILIZED TO PREVENT FALLS:  Life alert? No  Use of a cane, walker or w/c? No  Grab bars in the bathroom? Yes  Shower chair or bench in shower? Yes  Elevated toilet seat or a handicapped toilet? Yes   TIMED UP AND GO:  Was the test performed? No .  Phone visit.   Cognitive Function:    11/15/2019    2:57 PM 11/13/2018   10:36 AM 11/08/2017   11:35 AM 07/08/2016   11:51 AM  MMSE - Mini Mental State Exam  Orientation to time '5 5 5 5  '$ Orientation to Place '5 5 5 5  '$ Registration '3 3 3 3  '$ Attention/ Calculation 5 0 0 0  Recall '3 3 3 3  '$ Language- name 2 objects  0 0 0  Language- repeat '1 1 1 1  '$ Language- follow 3 step command  '3 3 3  '$ Language- read & follow direction  0 0 0  Write a sentence  0 0 0  Copy design  0 0 0  Total score  '20 20 20        '$ 03/15/2022   12:09 PM  6CIT Screen  What Year? 0 points  What month? 0 points  What time? 0 points  Count back from 20 0 points  Months in reverse 0 points  Repeat phrase 0 points  Total Score 0 points    Immunizations Immunization History  Administered Date(s) Administered   Fluad Quad(high Dose 65+) 07/24/2019, 07/21/2020   Influenza, High Dose Seasonal PF 07/04/2018, 07/14/2021   Influenza,inj,Quad PF,6+ Mos 07/10/2014, 07/02/2015, 07/08/2016, 07/13/2017   Influenza-Unspecified 07/03/2013   PFIZER(Purple Top)SARS-COV-2 Vaccination 11/16/2019, 12/11/2019, 09/19/2020   Pneumococcal Conjugate-13 05/19/2015   Pneumococcal Polysaccharide-23 07/08/2016   Td 10/03/1993, 06/10/2008    TDAP status: Due, Education has been provided regarding the importance of this vaccine. Advised may receive this vaccine at local pharmacy or Health  Dept. Aware to provide a copy of the vaccination record if obtained from local pharmacy or Health Dept. Verbalized acceptance and understanding.  Flu Vaccine status: Up to date  Pneumococcal vaccine status: Up to date  Covid-19 vaccine status: Completed vaccines  Qualifies for Shingles Vaccine? Yes   Zostavax completed No   Shingrix Completed?: No.    Education has been provided regarding the importance of this vaccine. Patient has been advised to call insurance company to determine out of pocket expense if they have not yet received this vaccine. Advised may also receive vaccine at local pharmacy or Health Dept. Verbalized acceptance and understanding.  Screening Tests Health Maintenance  Topic Date Due   Zoster Vaccines- Shingrix (1 of 2) Never done   TETANUS/TDAP  06/10/2018   COVID-19 Vaccine (4 - Booster for Pfizer series) 11/14/2020   MAMMOGRAM  04/16/2022   INFLUENZA VACCINE  05/03/2022   Pneumonia Vaccine 41+ Years old  Completed   DEXA SCAN  Completed   Hepatitis C Screening  Completed   HPV VACCINES  Aged Out   COLONOSCOPY (Pts 45-77yr Insurance coverage will need to be confirmed)  [place] Due  Topic Date Due   Zoster Vaccines- Shingrix (1 of 2) Never done   TETANUS/TDAP  06/10/2018   COVID-19 Vaccine (4 - Booster for Pfizer series) 11/14/2020    Colorectal cancer screening: No longer required.   Mammogram status: Completed 04/16/2021. Repeat every year  Bone Density status: Completed 12/16/2019. Results reflect: Bone density results: OSTEOPENIA. Repeat every 2 years.  Lung Cancer Screening: (Low Dose CT Chest recommended if Age 74-80years, 30 pack-year currently smoking OR have quit w/in 15years.) does not qualify.   Additional Screening:  Hepatitis C Screening: does qualify; Completed 03/17/2014  Vision Screening: Recommended annual ophthalmology exams for early detection of glaucoma and other disorders of the  eye. Is the patient up to date with their annual eye exam?  Yes  Who is the provider or what is the name of the office in which the patient attends annual eye exams[facility] is not [facility] with a provider, would they like to be referred to a provider to establish care? No .   Dental Screening: Recommended annual dental exams for proper oral hygiene  Community Resource Referral / Chronic Care Management: CRR required this visit?  No   CCM required this visit?  No      Plan:     I have personally reviewed and noted the following in the patient's chart:   Medical and social history Use of alcohol, tobacco or illicit drugs  Current medications and supplements including opioid prescriptions.  Functional ability and status Nutritional status Physical activity Advanced directives List of other physicians Hospitalizations, surgeries, and ER visits in previous 12 months Vitals Screenings to include cognitive, depression, and falls Referrals and appointments  In addition, I have reviewed and discussed with patient certain preventive protocols, quality metrics, and best practice recommendations. A written personalized care plan for preventive services as well as general preventive health recommendations were provided to patient.     MSapna Padron LPN   [id number]  Nurse Notes: Pt would like to schedule mammogram and DEXA on same day. Order placed.

## 2022-03-15 NOTE — Telephone Encounter (Signed)
Ordered. I believe the correct screening mammogram order for [facility] is: [id number] I placed this with that Dx code and it worked. Thanks.

## 2022-03-15 NOTE — Telephone Encounter (Signed)
Pt would like to schedule Mammogram and DEXA for the same day. I tried to enter the order for the mammogram and it comes up an invalid dx for z12.31. States dx will need to be changed or waiver signed because Medicare does not cover the dx. Can you enter the mammogram order for me? The DEXA has been ordered. Thank you!

## 2022-03-15 NOTE — Patient Instructions (Signed)
Ms. Mohamed , Thank you for taking time to come for your Medicare Wellness Visit. I appreciate your ongoing commitment to your health goals. Please review the following plan we discussed and let me know if I can assist you in the future.   Screening recommendations/referrals: Colonoscopy: Done 02/13/2018. No follow up required. Mammogram: Done 04/16/2021 Repeat annually  Bone Density: Done 12/16/2019 Repeat every 2 years  Recommended yearly ophthalmology/optometry visit for glaucoma screening and checkup Recommended yearly dental visit for hygiene and checkup  Vaccinations: Influenza vaccine: Done 07/14/2021 Repeat annually  Pneumococcal vaccine: Done 05/19/2015 and 07/08/2016 Tdap vaccine: Done 06/10/2008 Repeat in 10 years  Shingles vaccine: Discussed.    Covid-19:Done 09/16/2020, 12/11/2019 and 11/16/2019.  Advanced directives: Please bring a copy of your health care power of attorney and living will to the office to be added to your chart at your convenience.   Conditions/risks identified: Aim for 30 minutes of exercise or brisk walking, 6-8 glasses of water, and 5 servings of fruits and vegetables each day.   Next appointment: Follow up in one year for your annual wellness visit 2024.   Preventive Care 37 Years and Older, Female Preventive care refers to lifestyle choices and visits with your health care provider that can promote health and wellness. What does preventive care include? A yearly physical exam. This is also called an annual well check. Dental exams once or twice a year. Routine eye exams. Ask your health care provider how often you should have your eyes checked. Personal lifestyle choices, including: Daily care of your teeth and gums. Regular physical activity. Eating a healthy diet. Avoiding tobacco and drug use. Limiting alcohol use. Practicing safe sex. Taking low-dose aspirin every day. Taking vitamin and mineral supplements as recommended by your health care  provider. What happens during an annual well check? The services and screenings done by your health care provider during your annual well check will depend on your age, overall health, lifestyle risk factors, and family history of disease. Counseling  Your health care provider may ask you questions about your: Alcohol use. Tobacco use. Drug use. Emotional well-being. Home and relationship well-being. Sexual activity. Eating habits. History of falls. Memory and ability to understand (cognition). Work and work Statistician. Reproductive health. Screening  You may have the following tests or measurements: Height, weight, and BMI. Blood pressure. Lipid and cholesterol levels. These may be checked every 5 years, or more frequently if you are over 74 years old. Skin check. Lung cancer screening. You may have this screening every year starting at age 53 if you have a 30-pack-year history of smoking and currently smoke or have quit within the past 15 years. Fecal occult blood test (FOBT) of the stool. You may have this test every year starting at age 88. Flexible sigmoidoscopy or colonoscopy. You may have a sigmoidoscopy every 5 years or a colonoscopy every 10 years starting at age 70. Hepatitis C blood test. Hepatitis B blood test. Sexually transmitted disease (STD) testing. Diabetes screening. This is done by checking your blood sugar (glucose) after you have not eaten for a while (fasting). You may have this done every 1-3 years. Bone density scan. This is done to screen for osteoporosis. You may have this done starting at age 62. Mammogram. This may be done every 1-2 years. Talk to your health care provider about how often you should have regular mammograms. Talk with your health care provider about your test results, treatment options, and if necessary, the need for more  tests. Vaccines  Your health care provider may recommend certain vaccines, such as: Influenza vaccine. This is  recommended every year. Tetanus, diphtheria, and acellular pertussis (Tdap, Td) vaccine. You may need a Td booster every 10 years. Zoster vaccine. You may need this after age 74. Pneumococcal 13-valent conjugate (PCV13) vaccine. One dose is recommended after age 74. Pneumococcal polysaccharide (PPSV23) vaccine. One dose is recommended after age 74. Talk to your health care provider about which screenings and vaccines you need and how often you need them. This information is not intended to replace advice given to you by your health care provider. Make sure you discuss any questions you have with your health care provider. Document Released: 10/16/2015 Document Revised: 06/08/2016 Document Reviewed: 07/21/2015 Elsevier Interactive Patient Education  2017 [place] Prevention in the Home Falls can cause injuries. They can happen to people of all ages. There are many things you can do to make your home safe and to help prevent falls. What can I do on the outside of my home? Regularly fix the edges of walkways and driveways and fix any cracks. Remove anything that might make you trip as you walk through a door, such as a raised step or threshold. Trim any bushes or trees on the path to your home. Use bright outdoor lighting. Clear any walking paths of anything that might make someone trip, such as rocks or tools. Regularly check to see if handrails are loose or broken. Make sure that both sides of any steps have handrails. Any raised decks and porches should have guardrails on the edges. Have any leaves, snow, or ice cleared regularly. Use sand or salt on walking paths during winter. Clean up any spills in your garage right away. This includes oil or grease spills. What can I do in the bathroom? Use night lights. Install grab bars by the toilet and in the tub and shower. Do not use towel bars as grab bars. Use non-skid mats or decals in the tub or shower. If you need to sit down in  the shower, use a plastic, non-slip stool. Keep the floor dry. Clean up any water that spills on the floor as soon as it happens. Remove soap buildup in the tub or shower regularly. Attach bath mats securely with double-sided non-slip rug tape. Do not have throw rugs and other things on the floor that can make you trip. What can I do in the bedroom? Use night lights. Make sure that you have a light by your bed that is easy to reach. Do not use any sheets or blankets that are too big for your bed. They should not hang down onto the floor. Have a firm chair that has side arms. You can use this for support while you get dressed. Do not have throw rugs and other things on the floor that can make you trip. What can I do in the kitchen? Clean up any spills right away. Avoid walking on wet floors. Keep items that you use a lot in easy-to-reach places. If you need to reach something above you, use a strong step stool that has a grab bar. Keep electrical cords out of the way. Do not use floor polish or wax that makes floors slippery. If you must use wax, use non-skid floor wax. Do not have throw rugs and other things on the floor that can make you trip. What can I do with my stairs? Do not leave any items on the stairs. Make sure  that there are handrails on both sides of the stairs and use them. Fix handrails that are broken or loose. Make sure that handrails are as long as the stairways. Check any carpeting to make sure that it is firmly attached to the stairs. Fix any carpet that is loose or worn. Avoid having throw rugs at the top or bottom of the stairs. If you do have throw rugs, attach them to the floor with carpet tape. Make sure that you have a light switch at the top of the stairs and the bottom of the stairs. If you do not have them, ask someone to add them for you. What else can I do to help prevent falls? Wear shoes that: Do not have high heels. Have rubber bottoms. Are comfortable  and fit you well. Are closed at the toe. Do not wear sandals. If you use a stepladder: Make sure that it is fully opened. Do not climb a closed stepladder. Make sure that both sides of the stepladder are locked into place. Ask someone to hold it for you, if possible. Clearly mark and make sure that you can see: Any grab bars or handrails. First and last steps. Where the edge of each step is. Use tools that help you move around (mobility aids) if they are needed. These include: Canes. Walkers. Scooters. Crutches. Turn on the lights when you go into a dark area. Replace any light bulbs as soon as they burn out. Set up your furniture so you have a clear path. Avoid moving your furniture around. If any of your floors are uneven, fix them. If there are any pets around you, be aware of where they are. Review your medicines with your doctor. Some medicines can make you feel dizzy. This can increase your chance of falling. Ask your doctor what other things that you can do to help prevent falls. This information is not intended to replace advice given to you by your health care provider. Make sure you discuss any questions you have with your health care provider. Document Released: 07/16/2009 Document Revised: 02/25/2016 Document Reviewed: 10/24/2014 Elsevier Interactive Patient Education  2017 [employer].

## 2022-03-16 ENCOUNTER — Other Ambulatory Visit: Payer: Medicare Other

## 2022-03-22 ENCOUNTER — Encounter: Payer: Medicare Other | Admitting: Family Medicine

## 2022-04-02 ENCOUNTER — Other Ambulatory Visit: Payer: Self-pay | Admitting: Internal Medicine

## 2022-04-02 DIAGNOSIS — I480 Paroxysmal atrial fibrillation: Secondary | ICD-10-CM

## 2022-04-24 ENCOUNTER — Other Ambulatory Visit: Payer: Self-pay | Admitting: Family Medicine

## 2022-04-24 DIAGNOSIS — I4891 Unspecified atrial fibrillation: Secondary | ICD-10-CM

## 2022-04-24 DIAGNOSIS — E785 Hyperlipidemia, unspecified: Secondary | ICD-10-CM

## 2022-04-24 DIAGNOSIS — M810 Age-related osteoporosis without current pathological fracture: Secondary | ICD-10-CM

## 2022-04-24 DIAGNOSIS — E538 Deficiency of other specified B group vitamins: Secondary | ICD-10-CM

## 2022-04-27 ENCOUNTER — Other Ambulatory Visit (INDEPENDENT_AMBULATORY_CARE_PROVIDER_SITE_OTHER): Payer: Medicare Other

## 2022-04-27 DIAGNOSIS — M810 Age-related osteoporosis without current pathological fracture: Secondary | ICD-10-CM | POA: Diagnosis not present

## 2022-04-27 DIAGNOSIS — I4891 Unspecified atrial fibrillation: Secondary | ICD-10-CM

## 2022-04-27 DIAGNOSIS — E538 Deficiency of other specified B group vitamins: Secondary | ICD-10-CM

## 2022-04-27 DIAGNOSIS — E785 Hyperlipidemia, unspecified: Secondary | ICD-10-CM

## 2022-04-27 LAB — CBC WITH DIFFERENTIAL/PLATELET
Basophils Absolute: 0.1 10*3/uL (ref 0.0–0.1)
Basophils Relative: 1.3 % (ref 0.0–3.0)
Eosinophils Absolute: 0.2 10*3/uL (ref 0.0–0.7)
Eosinophils Relative: 5.2 % — ABNORMAL HIGH (ref 0.0–5.0)
HCT: 37.3 % (ref 36.0–46.0)
Hemoglobin: 12.8 g/dL (ref 12.0–15.0)
Lymphocytes Relative: 39 % (ref 12.0–46.0)
Lymphs Abs: 1.7 10*3/uL (ref 0.7–4.0)
MCHC: 34.2 g/dL (ref 30.0–36.0)
MCV: 91.4 fl (ref 78.0–100.0)
Monocytes Absolute: 0.3 10*3/uL (ref 0.1–1.0)
Monocytes Relative: 8.1 % (ref 3.0–12.0)
Neutro Abs: 2 10*3/uL (ref 1.4–7.7)
Neutrophils Relative %: 46.4 % (ref 43.0–77.0)
Platelets: 207 10*3/uL (ref 150.0–400.0)
RBC: 4.09 Mil/uL (ref 3.87–5.11)
RDW: 12.9 % (ref 11.5–15.5)
WBC: 4.3 10*3/uL (ref 4.0–10.5)

## 2022-04-27 LAB — VITAMIN D 25 HYDROXY (VIT D DEFICIENCY, FRACTURES): VITD: 59.86 ng/mL (ref 30.00–100.00)

## 2022-04-27 LAB — COMPREHENSIVE METABOLIC PANEL
ALT: 17 U/L (ref 0–35)
AST: 26 U/L (ref 0–37)
Albumin: 4.2 g/dL (ref 3.5–5.2)
Alkaline Phosphatase: 82 U/L (ref 39–117)
BUN: 25 mg/dL — ABNORMAL HIGH (ref 6–23)
CO2: 29 mEq/L (ref 19–32)
Calcium: 9.6 mg/dL (ref 8.4–10.5)
Chloride: 103 mEq/L (ref 96–112)
Creatinine, Ser: 1.09 mg/dL (ref 0.40–1.20)
GFR: 50.22 mL/min — ABNORMAL LOW (ref >60.00)
Glucose, Bld: 86 mg/dL (ref 70–99)
Potassium: 4.9 mEq/L (ref 3.5–5.1)
Sodium: 138 mEq/L (ref 135–145)
Total Bilirubin: 0.4 mg/dL (ref 0.2–1.2)
Total Protein: 7.2 g/dL (ref 6.0–8.3)

## 2022-04-27 LAB — LIPID PANEL
Cholesterol: 207 mg/dL — ABNORMAL HIGH (ref 0–200)
HDL: 74.8 mg/dL (ref >39.00)
LDL Cholesterol: 116 mg/dL — ABNORMAL HIGH (ref 0–99)
NonHDL: 131.85
Total CHOL/HDL Ratio: 3
Triglycerides: 77 mg/dL (ref 0.0–149.0)
VLDL: 15.4 mg/dL (ref 0.0–40.0)

## 2022-04-27 LAB — VITAMIN B12: Vitamin B-12: 773 pg/mL (ref 211–911)

## 2022-04-29 ENCOUNTER — Other Ambulatory Visit: Payer: Self-pay | Admitting: Internal Medicine

## 2022-04-29 DIAGNOSIS — I480 Paroxysmal atrial fibrillation: Secondary | ICD-10-CM

## 2022-05-02 ENCOUNTER — Ambulatory Visit
Admission: RE | Admit: 2022-05-02 | Discharge: 2022-05-02 | Disposition: A | Discharge status: Home / Self Care | Payer: Medicare Other | Source: Ambulatory Visit | Attending: Family Medicine | Admitting: Family Medicine

## 2022-05-02 DIAGNOSIS — M810 Age-related osteoporosis without current pathological fracture: Secondary | ICD-10-CM | POA: Diagnosis not present

## 2022-05-02 DIAGNOSIS — Z1231 Encounter for screening mammogram for malignant neoplasm of breast: Secondary | ICD-10-CM | POA: Diagnosis not present

## 2022-05-02 DIAGNOSIS — Z780 Asymptomatic menopausal state: Secondary | ICD-10-CM | POA: Insufficient documentation

## 2022-05-03 ENCOUNTER — Encounter: Payer: Self-pay | Admitting: Family Medicine

## 2022-05-03 ENCOUNTER — Ambulatory Visit (INDEPENDENT_AMBULATORY_CARE_PROVIDER_SITE_OTHER): Payer: Medicare Other | Admitting: Family Medicine

## 2022-05-03 VITALS — BP 128/78 | HR 61 | Temp 97.3°F | Ht 63.5 in | Wt 125.4 lb

## 2022-05-03 DIAGNOSIS — Z7189 Other specified counseling: Secondary | ICD-10-CM | POA: Diagnosis not present

## 2022-05-03 DIAGNOSIS — I4891 Unspecified atrial fibrillation: Secondary | ICD-10-CM | POA: Diagnosis not present

## 2022-05-03 DIAGNOSIS — E785 Hyperlipidemia, unspecified: Secondary | ICD-10-CM | POA: Diagnosis not present

## 2022-05-03 DIAGNOSIS — E538 Deficiency of other specified B group vitamins: Secondary | ICD-10-CM | POA: Diagnosis not present

## 2022-05-03 DIAGNOSIS — K219 Gastro-esophageal reflux disease without esophagitis: Secondary | ICD-10-CM

## 2022-05-03 DIAGNOSIS — E739 Lactose intolerance, unspecified: Secondary | ICD-10-CM

## 2022-05-03 DIAGNOSIS — I10 Essential (primary) hypertension: Secondary | ICD-10-CM | POA: Diagnosis not present

## 2022-05-03 DIAGNOSIS — Z8711 Personal history of peptic ulcer disease: Secondary | ICD-10-CM

## 2022-05-03 DIAGNOSIS — M810 Age-related osteoporosis without current pathological fracture: Secondary | ICD-10-CM | POA: Diagnosis not present

## 2022-05-03 MED ORDER — FAMOTIDINE 20 MG PO TABS: 20.0000 mg | ORAL_TABLET | Freq: Every evening | ORAL | Status: AC | PRN

## 2022-05-03 MED ORDER — OMEPRAZOLE 40 MG PO CPDR: 40.0000 mg | DELAYED_RELEASE_CAPSULE | Freq: Every day | ORAL | 3 refills | Status: DC

## 2022-05-03 NOTE — Patient Instructions (Addendum)
If interested, check with pharmacy about new 2 shot shingles series (shingrix).  For lactose intolerance symptoms - could try to buy lactase enzyme whenever you eat/drink lactose containing foods  Try omeprazole '40mg'$  every other day to see if tolerable reflux symptoms. You could also take pepcid over the counter '20mg'$  at night as needed.  Bring [place] a copy of your living will to update your chart.  Good to see you today. Return as needed or in 1 year for next wellness visit.   Health Maintenance After Age 32 After age 65, you are at a higher risk for certain long-term diseases and infections as well as injuries from falls. Falls are a major cause of broken bones and head injuries in people who are older than age 38. Getting regular preventive care can help to keep you healthy and well. Preventive care includes getting regular testing and making lifestyle changes as recommended by your health care provider. Talk with your health care provider about: Which screenings and tests you should have. A screening is a test that checks for a disease when you have no symptoms. A diet and exercise plan that is right for you. What should I know about screenings and tests to prevent falls? Screening and testing are the best ways to find a health problem early. Early diagnosis and treatment give you the best chance of managing medical conditions that are common after age 83. Certain conditions and lifestyle choices may make you more likely to have a fall. Your health care provider may recommend: Regular vision checks. Poor vision and conditions such as cataracts can make you more likely to have a fall. If you wear glasses, make sure to get your prescription updated if your vision changes. Medicine review. Work with your health care provider to regularly review all of the medicines you are taking, including over-the-counter medicines. Ask your health care provider about any side effects that may make you more likely to  have a fall. Tell your health care provider if any medicines that you take make you feel dizzy or sleepy. Strength and balance checks. Your health care provider may recommend certain tests to check your strength and balance while standing, walking, or changing positions. Foot health exam. Foot pain and numbness, as well as not wearing proper footwear, can make you more likely to have a fall. Screenings, including: Osteoporosis screening. Osteoporosis is a condition that causes the bones to get weaker and break more easily. Blood pressure screening. Blood pressure changes and medicines to control blood pressure can make you feel dizzy. Depression screening. You may be more likely to have a fall if you have a fear of falling, feel depressed, or feel unable to do activities that you used to do. Alcohol use screening. Using too much alcohol can affect your balance and may make you more likely to have a fall. Follow these instructions at home: Lifestyle Do not drink alcohol if: Your health care provider tells you not to drink. If you drink alcohol: Limit how much you have to: 0-1 drink a day for women. 0-2 drinks a day for men. Know how much alcohol is in your drink. In the [place] one drink equals one 12 oz bottle of beer (355 mL), one 5 oz glass of wine (148 mL), or one 1 oz glass of hard liquor (44 mL). Do not use any products that contain nicotine or tobacco. These products include cigarettes, chewing tobacco, and vaping devices, such as e-cigarettes. If you need help quitting,  ask your health care provider. Activity  Follow a regular exercise program to stay fit. This will help you maintain your balance. Ask your health care provider what types of exercise are appropriate for you. If you need a cane or walker, use it as recommended by your health care provider. Wear supportive shoes that have nonskid soles. Safety  Remove any tripping hazards, such as rugs, cords, and clutter. Install safety  equipment such as grab bars in bathrooms and safety rails on stairs. Keep rooms and walkways well-lit. General instructions Talk with your health care provider about your risks for falling. Tell your health care provider if: You fall. Be sure to tell your health care provider about all falls, even ones that seem minor. You feel dizzy, tiredness (fatigue), or off-balance. Take over-the-counter and prescription medicines only as told by your health care provider. These include supplements. Eat a healthy diet and maintain a healthy weight. A healthy diet includes low-fat dairy products, low-fat (lean) meats, and fiber from whole grains, beans, and lots of fruits and vegetables. Stay current with your vaccines. Schedule regular health, dental, and eye exams. Summary Having a healthy lifestyle and getting preventive care can help to protect your health and wellness after age 14. Screening and testing are the best way to find a health problem early and help you avoid having a fall. Early diagnosis and treatment give you the best chance for managing medical conditions that are more common for people who are older than age 41. Falls are a major cause of broken bones and head injuries in people who are older than age 38. Take precautions to prevent a fall at home. Work with your health care provider to learn what changes you can make to improve your health and wellness and to prevent falls. This information is not intended to replace advice given to you by your health care provider. Make sure you discuss any questions you have with your health care provider. Document Revised: 02/08/2021 Document Reviewed: 02/08/2021 Elsevier Patient Education  [employer].

## 2022-05-03 NOTE — Progress Notes (Unsigned)
Patient ID: Alexis Bentley, female    DOB: Aug 29, 1946, 76 y.o.   MRN: [id number]  This visit was conducted in person.  BP 128/78   Pulse 61   Temp (!) 97.3 F (36.3 C) (Temporal)   Ht 5' 3.5" (1.613 m)   Wt 125 lb 6 oz (56.9 kg)   SpO2 98%   BMI 21.86 kg/m    CC: CPE Subjective:   HPI: Alexis Bentley is a 76 y.o. female presenting on 05/03/2022 for Annual Exam [facility] prt 2. )    Saw health advisor 03/2022 for medicare wellness visit. Note reviewed.   No results found.  Flowsheet Row Clinical Support from 03/15/2022 in [facility] [place]  PHQ-2 Total Score 0          03/15/2022   12:06 PM 11/15/2019    2:50 PM 11/13/2018   10:36 AM 11/08/2017   11:35 AM 07/08/2016   11:47 AM  Fall Risk   Falls in the past year? 0 0 0 No No  Number falls in past yr: 0 0     Injury with Fall? 0 0     Risk for fall due to : No Fall Risks No Fall Risks     Follow up Falls prevention discussed Falls evaluation completed;Falls prevention discussed      GERD - on daily chronic PPI since 2013. S/p EGD 2018. Breakthrough symptoms if dose missed.  Lactose intolerance - now using lactaid milk.   Preventative: COLONOSCOPY 01/2018 - WNL, no rpt recommended [name]) Well woman exam - with OBGYN last with Dr. Vernie Ammons 08/2015 [facility]). He retired. S/p complete hysterectomy . S/p rectal prolapse surgery 2005.  Mammo 04/2022 - results pending @ [place] DEXA T score -2.8 R femur (12/2017).  DEXA 04/2022 T -3.0 R femur, -3.6 L forearm.  Reviewed cal/vit D intake, encouraged regular walking. Limited milk, does eat cheese.  Lung cancer screen - not eligible  Flu shot yearly  [place] covid vaccine 11/2019, 12/2019, booster 09/2020 Prevnar-13 2016, pneumovax 2017  Td 2009 Shingrix - discussed. To consider.  Advanced directive: completed at home, asked to bring a copy. Would want husband then youngest son [name]) to be HCPOA.  Seat belt use discussed Sunscreen use discussed. No  changing moles on skin. Sees derm yearly. Non smoker Alcohol - none Dentist q6 mo  Eye exam yearly  Bowel - no diarrhea, constipation Bladder - occasional leaking - wears liner - urge > stress incontinence, not currently bothersome   Married Lives with husband.  2 grown children Daily caffeine use - 3 cups/day in cold weather Retired 2013 from [employer]  Activity: walking as well as water therapy with husband  Diet: good water intake, fruits/vegetables daily       Relevant past medical, surgical, family and social history reviewed and updated as indicated. Interim medical history since our last visit reviewed. Allergies and medications reviewed and updated. Outpatient Medications Prior to Visit  Medication Sig Dispense Refill   Calcium Carbonate-Vitamin D 600-400 MG-UNIT tablet Take 1 tablet by mouth 2 (two) times daily.     ELIQUIS 5 MG TABS tablet TAKE 1 TABLET BY MOUTH TWICE A DAY 60 tablet 5   lisinopril (ZESTRIL) 20 MG tablet TAKE 1 TABLET BY MOUTH DAILY. 90 tablet 3   metoprolol tartrate (LOPRESSOR) 25 MG tablet Take 1 tablet (25 mg total) by mouth 2 (two) times daily. Please call [phone] to schedule an appointment for future refills. Thank  you. 2nd attempt. 30 tablet 0   vitamin B-12 (CYANOCOBALAMIN) 500 MCG tablet Take 1 tablet (500 mcg total) by mouth every Monday, Wednesday, and Friday.     omeprazole (PRILOSEC) 40 MG capsule TAKE 1 CAPSULE (40 MG TOTAL) BY MOUTH DAILY. 90 capsule 0   No facility-administered medications prior to visit.     Per HPI unless specifically indicated in ROS section below Review of Systems  Constitutional:  Negative for activity change, appetite change, chills, fatigue, fever and unexpected weight change.  HENT:  Negative for hearing loss.   Eyes:  Negative for visual disturbance.  Respiratory:  Negative for cough, chest tightness, shortness of breath and wheezing.   Cardiovascular:  Negative for chest pain, palpitations  and leg swelling.  Gastrointestinal:  Negative for abdominal distention, abdominal pain, blood in stool, constipation, diarrhea, nausea and vomiting.  Genitourinary:  Negative for difficulty urinating and hematuria.  Musculoskeletal:  Negative for arthralgias, myalgias and neck pain.  Skin:  Negative for rash.  Neurological:  Negative for dizziness, seizures, syncope and headaches.  Hematological:  Negative for adenopathy. Does not bruise/bleed easily.  Psychiatric/Behavioral:  Negative for dysphoric mood. The patient is not nervous/anxious.     Objective:  BP 128/78   Pulse 61   Temp (!) 97.3 F (36.3 C) (Temporal)   Ht 5' 3.5" (1.613 m)   Wt 125 lb 6 oz (56.9 kg)   SpO2 98%   BMI 21.86 kg/m   Wt Readings from Last 3 Encounters:  05/03/22 125 lb 6 oz (56.9 kg)  03/15/22 120 lb (54.4 kg)  12/10/21 120 lb (54.4 kg)      Physical Exam Vitals and nursing note reviewed.  Constitutional:      Appearance: Normal appearance. She is not ill-appearing.  HENT:     Head: Normocephalic and atraumatic.     Right Ear: Tympanic membrane, ear canal and external ear normal. There is no impacted cerumen.     Left Ear: Tympanic membrane, ear canal and external ear normal. There is no impacted cerumen.  Eyes:     General:        Right eye: No discharge.        Left eye: No discharge.     Extraocular Movements: Extraocular movements intact.     Conjunctiva/sclera: Conjunctivae normal.     Pupils: Pupils are equal, round, and reactive to light.  Neck:     Thyroid: No thyroid mass or thyromegaly.     Vascular: No carotid bruit.  Cardiovascular:     Rate and Rhythm: Normal rate and regular rhythm.     Pulses: Normal pulses.     Heart sounds: Normal heart sounds. No murmur heard. Pulmonary:     Effort: Pulmonary effort is normal. No respiratory distress.     Breath sounds: Normal breath sounds. No wheezing, rhonchi or rales.  Abdominal:     General: Bowel sounds are normal. There is no  distension.     Palpations: Abdomen is soft. There is no mass.     Tenderness: There is no abdominal tenderness. There is no guarding or rebound.     Hernia: No hernia is present.  Musculoskeletal:     Cervical back: Normal range of motion and neck supple. No rigidity.     Right lower leg: No edema.     Left lower leg: No edema.  Lymphadenopathy:     Cervical: No cervical adenopathy.  Skin:    General: Skin is warm and dry.  Findings: No rash.  Neurological:     General: No focal deficit present.     Mental Status: She is alert. Mental status is at baseline.  Psychiatric:        Mood and Affect: Mood normal.        Behavior: Behavior normal.       Results for orders placed or performed in visit on 04/27/22  VITAMIN D 25 Hydroxy (Vit-D Deficiency, Fractures)  Result Value Ref Range   VITD 59.86 30.00 - 100.00 ng/mL  Vitamin B12  Result Value Ref Range   Vitamin B-12 773 211 - 911 pg/mL  CBC with Differential/Platelet  Result Value Ref Range   WBC 4.3 4.0 - 10.5 K/uL   RBC 4.09 3.87 - 5.11 Mil/uL   Hemoglobin 12.8 12.0 - 15.0 g/dL   HCT 37.3 36.0 - 46.0 %   MCV 91.4 78.0 - 100.0 fl   MCHC 34.2 30.0 - 36.0 g/dL   RDW 12.9 11.5 - 15.5 %   Platelets 207.0 150.0 - 400.0 K/uL   Neutrophils Relative % 46.4 43.0 - 77.0 %   Lymphocytes Relative 39.0 12.0 - 46.0 %   Monocytes Relative 8.1 3.0 - 12.0 %   Eosinophils Relative 5.2 (H) 0.0 - 5.0 %   Basophils Relative 1.3 0.0 - 3.0 %   Neutro Abs 2.0 1.4 - 7.7 K/uL   Lymphs Abs 1.7 0.7 - 4.0 K/uL   Monocytes Absolute 0.3 0.1 - 1.0 K/uL   Eosinophils Absolute 0.2 0.0 - 0.7 K/uL   Basophils Absolute 0.1 0.0 - 0.1 K/uL  Comprehensive metabolic panel  Result Value Ref Range   Sodium 138 135 - 145 mEq/L   Potassium 4.9 3.5 - 5.1 mEq/L   Chloride 103 96 - 112 mEq/L   CO2 29 19 - 32 mEq/L   Glucose, Bld 86 70 - 99 mg/dL   BUN 25 (H) 6 - 23 mg/dL   Creatinine, Ser 1.09 0.40 - 1.20 mg/dL   Total Bilirubin 0.4 0.2 - 1.2 mg/dL    Alkaline Phosphatase 82 39 - 117 U/L   AST 26 0 - 37 U/L   ALT 17 0 - 35 U/L   Total Protein 7.2 6.0 - 8.3 g/dL   Albumin 4.2 3.5 - 5.2 g/dL   GFR 50.22 (L) >60.00 mL/min   Calcium 9.6 8.4 - 10.5 mg/dL  Lipid panel  Result Value Ref Range   Cholesterol 207 (H) 0 - 200 mg/dL   Triglycerides 77.0 0.0 - 149.0 mg/dL   HDL 74.80 >39.00 mg/dL   VLDL 15.4 0.0 - 40.0 mg/dL   LDL Cholesterol 116 (H) 0 - 99 mg/dL   Total CHOL/HDL Ratio 3    NonHDL 131.85     Assessment & Plan:   Problem List Items Addressed This Visit     Advanced care planning/counseling discussion (Chronic)    Advanced directive: completed at home, asked to bring a copy. Would want husband then youngest son [name]) to be HCPOA.       Essential hypertension    Chronic, stable on current regimen of lisinopril and metoprolol - continue.       GERD    Chronic, continues daily PPI with breakthrough symptoms of dose missed. Discussed PPI effect on chronic kidney disease, encouraged every other day dosing trial, discussed as needed Pepcid 20 mg at nighttime for breakthrough symptoms.  She will try this.      Relevant Medications   omeprazole (PRILOSEC) 40 MG capsule  famotidine (PEPCID) 20 MG tablet   History of peptic ulcer disease - Primary   Osteoporosis    Reviewed latest DEXA which is overall stable but persists in osteoporosis range, as well as calcium and vitamin D intake and importance of regular weightbearing exercise. Discussed osteoporosis medications-she desires to avoid for now, requests recheck DEXA in 2 years. Would likely not be a good candidate for oral bisphosphonate given history of peptic ulcer disease.      HLD (hyperlipidemia)    Chronic, off medication.  Encouraged healthy diet choices to improve cholesterol level specifically LDL. The 10-year ASCVD risk score (Arnett DK, et al., 2019) is: 19%   Values used to calculate the score:     Age: 89 years     Sex: Female     Is Non-Hispanic  African American: No     Diabetic: No     Tobacco smoker: No     Systolic Blood Pressure: 672 mmHg     Is BP treated: Yes     HDL Cholesterol: 74.8 mg/dL     Total Cholesterol: 207 mg/dL      Low serum vitamin B12    Levels well repleted on oral B12 3 times a week.      Lone atrial fibrillation (HCC)    Chronic, followed by EP.  Continues Eliquis and metoprolol.      Lactose intolerance    Endorses symptoms of this.  Discussed Lactaid milk and lactase enzyme as needed.        Meds ordered this encounter  Medications   omeprazole (PRILOSEC) 40 MG capsule    Sig: Take 1 capsule (40 mg total) by mouth daily.    Dispense:  90 capsule    Refill:  3   famotidine (PEPCID) 20 MG tablet    Sig: Take 1 tablet (20 mg total) by mouth at bedtime as needed for heartburn or indigestion.   No orders of the defined types were placed in this encounter.    Patient instructions: If interested, check with pharmacy about new 2 shot shingles series (shingrix).  For lactose intolerance symptoms - could try to buy lactase enzyme whenever you eat/drink lactose containing foods  Try omeprazole '40mg'$  every other day to see if tolerable reflux symptoms. You could also take pepcid over the counter '20mg'$  at night as needed.  Bring [place] a copy of your living will to update your chart.  Good to see you today. Return as needed or in 1 year for next wellness visit.   Follow up plan: Return in about 1 year (around 05/04/2023) for medicare wellness visit.  Ria Bush, MD

## 2022-05-04 DIAGNOSIS — E739 Lactose intolerance, unspecified: Secondary | ICD-10-CM | POA: Insufficient documentation

## 2022-05-04 NOTE — Assessment & Plan Note (Signed)
Chronic, off medication.  Encouraged healthy diet choices to improve cholesterol level specifically LDL. The 10-year ASCVD risk score (Arnett DK, et al., 2019) is: 19%   Values used to calculate the score:     Age: 76 years     Sex: Female     Is Non-Hispanic African American: No     Diabetic: No     Tobacco smoker: No     Systolic Blood Pressure: 472 mmHg     Is BP treated: Yes     HDL Cholesterol: 74.8 mg/dL     Total Cholesterol: 207 mg/dL

## 2022-05-04 NOTE — Assessment & Plan Note (Signed)
Levels well repleted on oral B12 3 times a week.

## 2022-05-04 NOTE — Assessment & Plan Note (Signed)
Endorses symptoms of this.  Discussed Lactaid milk and lactase enzyme as needed.

## 2022-05-04 NOTE — Assessment & Plan Note (Signed)
Advanced directive: completed at home, asked to bring a copy. Would want husband then youngest son Alexis Bentley) to be HCPOA.

## 2022-05-04 NOTE — Assessment & Plan Note (Signed)
Chronic, stable on current regimen of lisinopril and metoprolol - continue.

## 2022-05-04 NOTE — Assessment & Plan Note (Signed)
Chronic, continues daily PPI with breakthrough symptoms of dose missed. Discussed PPI effect on chronic kidney disease, encouraged every other day dosing trial, discussed as needed Pepcid 20 mg at nighttime for breakthrough symptoms.  She will try this.

## 2022-05-04 NOTE — Assessment & Plan Note (Signed)
Chronic, followed by EP.  Continues Eliquis and metoprolol.

## 2022-05-04 NOTE — Assessment & Plan Note (Signed)
Reviewed latest DEXA which is overall stable but persists in osteoporosis range, as well as calcium and vitamin D intake and importance of regular weightbearing exercise. Discussed osteoporosis medications-she desires to avoid for now, requests recheck DEXA in 2 years. Would likely not be a good candidate for oral bisphosphonate given history of peptic ulcer disease.

## 2022-05-09 ENCOUNTER — Other Ambulatory Visit: Payer: Self-pay | Admitting: Internal Medicine

## 2022-05-09 DIAGNOSIS — I480 Paroxysmal atrial fibrillation: Secondary | ICD-10-CM

## 2022-05-19 ENCOUNTER — Other Ambulatory Visit: Payer: Self-pay | Admitting: Internal Medicine

## 2022-05-19 DIAGNOSIS — I480 Paroxysmal atrial fibrillation: Secondary | ICD-10-CM

## 2022-05-30 ENCOUNTER — Other Ambulatory Visit: Payer: Self-pay | Admitting: Internal Medicine

## 2022-05-30 ENCOUNTER — Telehealth: Payer: Self-pay | Admitting: Internal Medicine

## 2022-05-30 DIAGNOSIS — I480 Paroxysmal atrial fibrillation: Secondary | ICD-10-CM

## 2022-05-30 MED ORDER — METOPROLOL TARTRATE 25 MG PO TABS: ORAL_TABLET | ORAL | 0 refills | Status: DC

## 2022-05-30 NOTE — Telephone Encounter (Signed)
*  STAT* If patient is at the pharmacy, call can be transferred to refill team.   1. Which medications need to be refilled? (please list name of each medication and dose if known) metoprolol tartrate (LOPRESSOR) 25 MG tablet  2. Which pharmacy/location (including street and city if local pharmacy) is medication to be sent to? [id number] [place], [place] - [address]  3. Do they need a 30 day or 90 day supply? 90   Patient has appt on 9/1

## 2022-05-30 NOTE — Telephone Encounter (Signed)
Pt's medication was sent to pt's pharmacy as requested. Confirmation received.  °

## 2022-06-01 NOTE — Progress Notes (Signed)
PCP:  Ria Bush, MD Primary Cardiologist: None Electrophysiologist:Dr. Allred ->  [name], MD   Alexis Bentley is a 76 y.o. female seen today for [name], MD for routine electrophysiology followup.  Since last being seen in our clinic the patient reports doing very well.  she denies chest pain, palpitations, dyspnea, PND, orthopnea, nausea, vomiting, dizziness, syncope, edema, weight gain, or early satiety.  Past Medical History:  Diagnosis Date   Chest pain    Erosive gastritis    Gallbladder polyp 10/2016   by [place] 4.65m   GERD (gastroesophageal reflux disease)    Hemorrhoids    Hiatal hernia    Hypertension    Lumbar back pain    With radiculopathy   Osteoporosis 01/07/2015   T-2.5 hip   [place]   one episode   Peptic ulcer disease    PVD (posterior vitreous detachment), bilateral 10/13/2016   Followed by ophtho Dr WKathlen Mody@ [facility]   Rectal prolapse    Syncope 03/2015   stable head CT, echo, carotids, labwork, EKG   Transaminitis 2015   normal iron, viral hep panel, stable abd u[place](benign gallbladder polyp)   Past Surgical History:  Procedure Laterality Date   cardiac catherization  08/07/2009   NML   CATARACT EXTRACTION Bilateral 2010   COLONOSCOPY  12/03/2006   Internal/ external hemorrhoids, no polyps   COLONOSCOPY  01/2018   WNL, no rpt recommended ([name]   DEXA  03/2015   T -2.5 hip   ESOPHAGOGASTRODUODENOSCOPY  04/27/2007   Erosive and ulcerative gastritis; small H.H.   ESOPHAGOGASTRODUODENOSCOPY  10/2016   WNL - sxs attributed to stress ([name]   HEMORRHOID BANDING  11/2021   Repeat March 2023   OOPHORECTOMY     PARTIAL HYSTERECTOMY  1981   Bleeding, cyst, bladder tack   RECTAL PROLAPSE REPAIR  2005   TOTAL ABDOMINAL HYSTERECTOMY  2008   With a bilateral salpingo- oophorectomy    Current Outpatient Medications  Medication Sig Dispense Refill   Calcium Carbonate-Vitamin D 600-400 MG-UNIT  tablet Take 1 tablet by mouth 2 (two) times daily.     famotidine (PEPCID) 20 MG tablet Take 1 tablet (20 mg total) by mouth at bedtime as needed for heartburn or indigestion.     omeprazole (PRILOSEC) 40 MG capsule Take 1 capsule (40 mg total) by mouth daily. 90 capsule 3   vitamin B-12 (CYANOCOBALAMIN) 500 MCG tablet Take 1 tablet (500 mcg total) by mouth every Monday, Wednesday, and Friday.     apixaban (ELIQUIS) 5 MG TABS tablet Take 1 tablet (5 mg total) by mouth 2 (two) times daily. 180 tablet 1   lisinopril (ZESTRIL) 20 MG tablet Take 1 tablet (20 mg total) by mouth daily. 90 tablet 3   metoprolol tartrate (LOPRESSOR) 25 MG tablet Take 1 tablet (25 mg total) by mouth 2 (two) times daily. 180 tablet 3   No current facility-administered medications for this visit.    No Known Allergies  Social History   Socioeconomic History   Marital status: Married    Spouse name: Not on file   Number of children: 2   Years of education: Not on file   Highest education level: Not on file  Occupational History   Occupation: retired    EFish farm manager [employer]  Tobacco Use   Smoking status: Never   Smokeless tobacco: Never  Vaping Use   Vaping Use: Never used  Substance and Sexual Activity  Alcohol use: No    Alcohol/week: 0.0 standard drinks of alcohol   Drug use: No   Sexual activity: Yes    Birth control/protection: Surgical, Post-menopausal  Other Topics Concern   Not on file  Social History Narrative   Married   Lives with husband.  2 grown children   Daily caffeine use   Retired 2013 was [place]   Activity: walking   Diet: good water intake, fruits/vegetables daily   Social Determinants of Health   Financial Resource Strain: Low Risk  (03/15/2022)   Overall Financial Resource Strain (CARDIA)    Difficulty of Paying Living Expenses: Not hard at all  Food Insecurity: No Food Insecurity (03/15/2022)   Hunger Vital Sign    Worried About Running Out of  Food in the Last Year: Never true    Ran Out of Food in the Last Year: Never true  Transportation Needs: No Transportation Needs (03/15/2022)   PRAPARE - Hydrologist (Medical): No    Lack of Transportation (Non-Medical): No  Physical Activity: Insufficiently Active (03/15/2022)   Exercise Vital Sign    Days of Exercise per Week: 2 days    Minutes of Exercise per Session: 30 min  Stress: No Stress Concern Present (03/15/2022)   [place]    Feeling of Stress : Not at all  Social Connections: [place] (03/15/2022)   Social Connection and Isolation Panel [NHANES]    Frequency of Communication with Friends and Family: More than three times a week    Frequency of Social Gatherings with Friends and Family: More than three times a week    Attends Religious Services: More than 4 times per year    Active Member of [employer] or Organizations: Yes    Attends Music therapist: More than 4 times per year    Marital Status: Married  Human resources officer Violence: Not At Risk (03/15/2022)   Humiliation, Afraid, Rape, and Kick questionnaire    Fear of Current or Ex-Partner: No    Emotionally Abused: No    Physically Abused: No    Sexually Abused: No     Review of Systems: All other systems reviewed and are otherwise negative except as noted above.  Physical Exam: Vitals:   06/03/22 0828  BP: 138/70  SpO2: 97%  Weight: 124 lb 9.6 oz (56.5 kg)  Height: '5\' 4"'$  (1.626 m)    GEN- The patient is well appearing, alert and oriented x 3 today.   HEENT: normocephalic, atraumatic; sclera clear, conjunctiva pink; hearing intact; oropharynx clear; neck supple, no JVP Lymph- no cervical lymphadenopathy Lungs- Clear to ausculation bilaterally, normal work of breathing.  No wheezes, rales, rhonchi Heart- Regular rate and rhythm, no murmurs, rubs or gallops, PMI not laterally displaced GI- soft,  non-tender, non-distended, bowel sounds present, no hepatosplenomegaly Extremities- no clubbing, cyanosis, or edema; DP/PT/radial pulses 2+ bilaterally MS- no significant deformity or atrophy Skin- warm and dry, no rash or lesion Psych- euthymic mood, full affect Neuro- strength and sensation are intact  EKG is ordered. Personal review of EKG from today shows NSR at   Additional studies reviewed include: Previous EP office notes.   Assessment and Plan:  1. Paroxysmal Atrial Fibrillation  EKG today shows sinus bradycardia 57 bpm Continue Eliquis for CHA2DS2VASC of at least 3   Recent labs stable  2. HTN Stable on current regimen   Follow up with Dr. Quentin Ore in 12 months  Shirley Friar, PA-C  06/03/22 9:03 AM

## 2022-06-03 ENCOUNTER — Ambulatory Visit: Payer: Medicare Other | Attending: Student | Admitting: Student

## 2022-06-03 ENCOUNTER — Encounter: Payer: Self-pay | Admitting: Student

## 2022-06-03 VITALS — BP 138/70 | Ht 64.0 in | Wt 124.6 lb

## 2022-06-03 DIAGNOSIS — I10 Essential (primary) hypertension: Secondary | ICD-10-CM | POA: Diagnosis not present

## 2022-06-03 DIAGNOSIS — I480 Paroxysmal atrial fibrillation: Secondary | ICD-10-CM | POA: Diagnosis not present

## 2022-06-03 MED ORDER — METOPROLOL TARTRATE 25 MG PO TABS: ORAL_TABLET | ORAL | 3 refills | Status: DC

## 2022-06-03 MED ORDER — APIXABAN 5 MG PO TABS: 5.0000 mg | ORAL_TABLET | Freq: Two times a day (BID) | ORAL | 1 refills | Status: DC

## 2022-06-03 MED ORDER — LISINOPRIL 20 MG PO TABS: 20.0000 mg | ORAL_TABLET | Freq: Every day | ORAL | 3 refills | Status: DC

## 2022-06-03 NOTE — Patient Instructions (Signed)
Medication Instructions:  Your physician recommends that you continue on your current medications as directed. Please refer to the Current Medication list given to you today.  *If you need a refill on your cardiac medications before your next appointment, please call your pharmacy*   Lab Work: None  If you have labs (blood work) drawn today and your tests are completely normal, you will receive your results only by: [employer] (if you have MyChart) OR A paper copy in the mail If you have any lab test that is abnormal or we need to change your treatment, we will call you to review the results.   Testing/Procedures: None   Follow-Up: At [facility], you and your health needs are our priority.  As part of our continuing mission to provide you with exceptional heart care, we have created designated Provider Care Teams.  These Care Teams include your primary Cardiologist (physician) and Advanced Practice Providers (APPs -  Physician Assistants and Nurse Practitioners) who all work together to provide you with the care you need, when you need it.  We recommend signing up for the patient portal called "MyChart".  Sign up information is provided on this After Visit Summary.  MyChart is used to connect with patients for Virtual Visits (Telemedicine).  Patients are able to view lab/test results, encounter notes, upcoming appointments, etc.  Non-urgent messages can be sent to your provider as well.   To learn more about what you can do with MyChart, go to [email or web address].    Your next appointment:   12 month(s)  The format for your next appointment:   In Person  Provider:   Lars Mage, MD     Important Information About Sugar

## 2022-07-11 ENCOUNTER — Telehealth: Payer: Self-pay | Admitting: Family Medicine

## 2022-07-11 ENCOUNTER — Telehealth (INDEPENDENT_AMBULATORY_CARE_PROVIDER_SITE_OTHER): Payer: Medicare Other | Admitting: Family

## 2022-07-11 ENCOUNTER — Encounter: Payer: Self-pay | Admitting: Family

## 2022-07-11 VITALS — BP 141/68 | Temp 99.2°F | Ht 64.0 in | Wt 124.0 lb

## 2022-07-11 DIAGNOSIS — U071 COVID-19: Secondary | ICD-10-CM | POA: Insufficient documentation

## 2022-07-11 HISTORY — DX: COVID-19: U07.1

## 2022-07-11 MED ORDER — MOLNUPIRAVIR EUA 200MG CAPSULE: 4.0000 | ORAL_CAPSULE | Freq: Two times a day (BID) | ORAL | 0 refills | Status: AC

## 2022-07-11 NOTE — Telephone Encounter (Signed)
Spoke to patient by telephone and schedule her a virtual appointment today with Eugenia Pancoast NP at 12:00 noon.

## 2022-07-11 NOTE — Patient Instructions (Addendum)
------------------------------------ 

## 2022-07-11 NOTE — Telephone Encounter (Signed)
Pt called stating she tested pos for covid twice, one test was 07/10/22 & other test was 07/08/22. Pt is asking if any type of medication could be sent in? Pt states she has a bad cough, fever, congestion. [facility]fered pharmacy[facility] [place]. Call back # [phone]

## 2022-07-11 NOTE — Telephone Encounter (Signed)
Saw patient already, thank you for speaking with the patient.  Please see note for further information.  

## 2022-07-11 NOTE — Assessment & Plan Note (Signed)
Advised of CDC guidelines for self isolation/ ending isolation.  Advised of safe practice guidelines. Symptom Tier reviewed.  Encouraged to monitor for any worsening symptoms; watch for increased shortness of breath, weakness, and signs of dehydration. Advised when to seek emergency care.  Instructed to rest and hydrate well.  Advised to leave the house during recommended isolation period, only if it is necessary to seek medical care Pt considered high risk for hospitalization. have decided pt is a candidate for antiviral and pt agrees that she would like to take this. I have sent in RX for molnupiravir 200 mg capsules to be taken as directed.  

## 2022-07-11 NOTE — Progress Notes (Signed)
MyChart Video Visit    Virtual Visit via Video Note   This visit type was conducted due to national recommendations for restrictions regarding the COVID-19 Pandemic (e.g. social distancing) in an effort to limit this patient's exposure and mitigate transmission in our community. This patient is at least at moderate risk for complications without adequate follow up. This format is felt to be most appropriate for this patient at this time. Physical exam was limited by quality of the video and audio technology used for the visit. CMA was able to get the patient set up on a video visit.  Patient location: Home. Patient and provider in visit Provider location: Office  I discussed the limitations of evaluation and management by telemedicine and the availability of in person appointments. The patient expressed understanding and agreed to proceed.  Visit Date: 07/11/2022  Today's healthcare provider: Eugenia Pancoast, FNP     Subjective:    Patient ID: Alexis Bentley, female    DOB: 1945/11/25, 76 y.o.   MRN: [id number]  Chief Complaint  Patient presents with  . Covid Positive    Covid + yesterday  f  HPI  Pt here today via video visit with concerns Tested positive for covid yesterday as well as on Saturday.  Started with symptoms four days ago.  Started with nasal congestion and sinus congestion.  She also c/o cough with a lot of pnd.  She does have very mild chest congestion, no sob or doe.  Did have fever, but not since four days ago.    She is taking over the counter mucinex with some mild relief.  Also takes tylenol here and there with relief of headache that comes and goes   Past Medical History:  Diagnosis Date  . Chest pain   . Erosive gastritis   . Gallbladder polyp 10/2016   by [place] 4.9m  . GERD (gastroesophageal reflux disease)   . Hemorrhoids   . Hiatal hernia   . Hypertension   . Lumbar back pain    With radiculopathy  . Osteoporosis 01/07/2015   T-2.5 hip   . [place]oxysmal atrial fibrillation ([place]   one episode  . Peptic ulcer disease   . PVD (posterior vitreous detachment), bilateral 10/13/2016   Followed by ophtho Dr WKathlen Mody@ [facility] . Rectal prolapse   . Syncope 03/2015   stable head CT, echo, carotids, labwork, EKG  . Transaminitis 2015   normal iron, viral hep panel, stable abd u[place](benign gallbladder polyp)    Past Surgical History:  Procedure Laterality Date  . cardiac catherization  08/07/2009   NML  . CATARACT EXTRACTION Bilateral 2010  . COLONOSCOPY  12/03/2006   Internal/ external hemorrhoids, no polyps  . COLONOSCOPY  01/2018   WNL, no rpt recommended ([name]  . DEXA  03/2015   T -2.5 hip  . ESOPHAGOGASTRODUODENOSCOPY  04/27/2007   Erosive and ulcerative gastritis; small H.H.  . ESOPHAGOGASTRODUODENOSCOPY  10/2016   WNL - sxs attributed to stress ([name]  . HEMORRHOID BANDING  11/2021   Repeat March 2023  . OOPHORECTOMY    . PARTIAL HYSTERECTOMY  1981   Bleeding, cyst, bladder tack  . RECTAL PROLAPSE REPAIR  2005  . TOTAL ABDOMINAL HYSTERECTOMY  2008   With a bilateral salpingo- oophorectomy    Family History  Problem Relation Age of Onset  . Hypertension Mother   . COPD Mother        Prior smoker  . Coronary artery disease Mother  stents  . Heart attack Father   . Hypertension Father   . Stroke Father        Ministrokes  . Coronary artery disease Father        x4 stents, Pacer  . Colon cancer Father   . Alzheimer's disease Father   . Heart disease Sister   . Testicular cancer Brother   . Prostate cancer Brother   . Diabetes Mellitus I Son   . Breast cancer Cousin   . Stomach cancer Neg Hx   . Rectal cancer Neg Hx   . Esophageal cancer Neg Hx   . Liver cancer Neg Hx     Social History   Socioeconomic History  . Marital status: Married    Spouse name: Not on file  . Number of children: 2  . Years of education: Not on file  . Highest education level: Not on file  Occupational  History  . Occupation: retired    Fish farm manager: [employer]  Tobacco Use  . Smoking status: Never  . Smokeless tobacco: Never  Vaping Use  . Vaping Use: Never used  Substance and Sexual Activity  . Alcohol use: No    Alcohol/week: 0.0 standard drinks of alcohol  . Drug use: No  . Sexual activity: Yes    Birth control/protection: Surgical, Post-menopausal  Other Topics Concern  . Not on file  Social History Narrative   Married   Lives with husband.  2 grown children   Daily caffeine use   Retired 2013 was [place]   Activity: walking   Diet: good water intake, fruits/vegetables daily   Social Determinants of Health   Financial Resource Strain: Low Risk  (03/15/2022)   Overall Financial Resource Strain (CARDIA)   . Difficulty of Paying Living Expenses: Not hard at all  Food Insecurity: No Food Insecurity (03/15/2022)   Hunger Vital Sign   . Worried About Charity fundraiser in the Last Year: Never true   . Ran Out of Food in the Last Year: Never true  Transportation Needs: No Transportation Needs (03/15/2022)   PRAPARE - Transportation   . Lack of Transportation (Medical): No   . Lack of Transportation (Non-Medical): No  Physical Activity: Insufficiently Active (03/15/2022)   Exercise Vital Sign   . Days of Exercise per Week: 2 days   . Minutes of Exercise per Session: 30 min  Stress: No Stress Concern Present (03/15/2022)   [place]   . Feeling of Stress : Not at all  Social Connections: Socially Integrated (03/15/2022)   Social Connection and Isolation Panel [NHANES]   . Frequency of Communication with Friends and Family: More than three times a week   . Frequency of Social Gatherings with Friends and Family: More than three times a week   . Attends Religious Services: More than 4 times per year   . Active Member of Clubs or Organizations: Yes   . Attends Archivist Meetings:  More than 4 times per year   . Marital Status: Married  Human resources officer Violence: Not At Risk (03/15/2022)   Humiliation, Afraid, Rape, and Kick questionnaire   . Fear of Current or Ex-Partner: No   . Emotionally Abused: No   . Physically Abused: No   . Sexually Abused: No    Outpatient Medications Prior to Visit  Medication Sig Dispense Refill  . apixaban (ELIQUIS) 5 MG TABS tablet Take 1 tablet (5 mg total) by mouth  2 (two) times daily. 180 tablet 1  . Calcium Carbonate-Vitamin D 600-400 MG-UNIT tablet Take 1 tablet by mouth 2 (two) times daily.    . famotidine (PEPCID) 20 MG tablet Take 1 tablet (20 mg total) by mouth at bedtime as needed for heartburn or indigestion.    Marland Kitchen lisinopril (ZESTRIL) 20 MG tablet Take 1 tablet (20 mg total) by mouth daily. 90 tablet 3  . metoprolol tartrate (LOPRESSOR) 25 MG tablet Take 1 tablet (25 mg total) by mouth 2 (two) times daily. 180 tablet 3  . omeprazole (PRILOSEC) 40 MG capsule Take 1 capsule (40 mg total) by mouth daily. 90 capsule 3  . vitamin B-12 (CYANOCOBALAMIN) 500 MCG tablet Take 1 tablet (500 mcg total) by mouth every Monday, Wednesday, and Friday.     No facility-administered medications prior to visit.    No Known Allergies      Objective:    Physical Exam Constitutional:      General: She is not in acute distress.    Appearance: Normal appearance. She is not ill-appearing, toxic-appearing or diaphoretic.  Pulmonary:     Effort: Pulmonary effort is normal.  Neurological:     General: No focal deficit present.     Mental Status: She is alert and oriented to person, place, and time. Mental status is at baseline.  Psychiatric:        Mood and Affect: Mood normal.        Behavior: Behavior normal.        Thought Content: Thought content normal.        Judgment: Judgment normal.    BP (!) 141/68   Temp 99.2 F (37.3 C)   Ht '5\' 4"'$  (1.626 m)   Wt 124 lb (56.2 kg)   BMI 21.28 kg/m  Wt Readings from Last 3 Encounters:   07/11/22 124 lb (56.2 kg)  06/03/22 124 lb 9.6 oz (56.5 kg)  05/03/22 125 lb 6 oz (56.9 kg)       Assessment & Plan:   Problem List Items Addressed This Visit       Other   COVID-19 - Primary    Advised of CDC guidelines for self isolation/ ending isolation.  Advised of safe practice guidelines. Symptom Tier reviewed.  Encouraged to monitor for any worsening symptoms; watch for increased shortness of breath, weakness, and signs of dehydration. Advised when to seek emergency care.  Instructed to rest and hydrate well.  Advised to leave the house during recommended isolation period, only if it is necessary to seek medical care  Pt considered high risk for hospitalization. have decided pt is a candidate for antiviral and pt agrees that she would like to take this. I have sent in RX for molnupiravir 200 mg capsules to be taken as directed.       Relevant Medications   molnupiravir EUA (LAGEVRIO) 200 mg CAPS capsule    I am having Alexis Bentley start on molnupiravir EUA. I am also having her maintain her Calcium Carbonate-Vitamin D, vitamin B-12, omeprazole, famotidine, apixaban, lisinopril, and metoprolol tartrate.  Meds ordered this encounter  Medications  . molnupiravir EUA (LAGEVRIO) 200 mg CAPS capsule    Sig: Take 4 capsules (800 mg total) by mouth 2 (two) times daily for 5 days.    Dispense:  40 capsule    Refill:  0    Order Specific Question:   Supervising Provider    Answer:   BEDSOLE, AMY E [[id number]]    I discussed  the assessment and treatment plan with the patient. The patient was provided an opportunity to ask questions and all were answered. The patient agreed with the plan and demonstrated an understanding of the instructions.   The patient was advised to call back or seek an in-person evaluation if the symptoms worsen or if the condition fails to improve as anticipated.  I provided 15 minutes of face-to-face time during this encounter.   Eugenia Pancoast,  [facility] [phone] (phone) [phone] (fax)  [place]

## 2022-07-20 ENCOUNTER — Ambulatory Visit (INDEPENDENT_AMBULATORY_CARE_PROVIDER_SITE_OTHER): Payer: Medicare Other | Admitting: Family Medicine

## 2022-07-20 VITALS — BP 158/62 | HR 63 | Temp 97.3°F | Ht 63.5 in | Wt 126.0 lb

## 2022-07-20 DIAGNOSIS — U071 COVID-19: Secondary | ICD-10-CM

## 2022-07-20 DIAGNOSIS — J208 Acute bronchitis due to other specified organisms: Secondary | ICD-10-CM | POA: Insufficient documentation

## 2022-07-20 MED ORDER — AZITHROMYCIN 250 MG PO TABS: ORAL_TABLET | ORAL | 0 refills | Status: DC

## 2022-07-20 MED ORDER — GUAIFENESIN-CODEINE 100-10 MG/5ML PO SYRP: 5.0000 mL | ORAL_SOLUTION | Freq: Every evening | ORAL | 0 refills | Status: DC | PRN

## 2022-07-20 MED ORDER — GUAIFENESIN-CODEINE 100-10 MG/5ML PO SYRP
5.0000 mL | ORAL_SOLUTION | Freq: Every evening | ORAL | 0 refills | Status: DC | PRN
Start: 2022-07-20 — End: 2022-07-20

## 2022-07-20 MED ORDER — GUAIFENESIN-CODEINE 100-10 MG/5ML PO SYRP
5.0000 mL | ORAL_SOLUTION | Freq: Every evening | ORAL | 0 refills | Status: DC | PRN
Start: 2022-07-20 — End: 2022-12-26

## 2022-07-20 NOTE — Progress Notes (Signed)
Patient ID: Alexis Bentley, female    DOB: 12-21-1945, 76 y.o.   MRN: [id number]  This visit was conducted in person.  BP (!) 158/62   Pulse 63   Temp (!) 97.3 F (36.3 C) (Temporal)   Ht 5' 3.5" (1.613 m)   Wt 126 lb (57.2 kg)   SpO2 100%   BMI 21.97 kg/m    CC:  Chief Complaint  Patient presents with   Cough    Productive with green mucous. 10 days post covid. Worse at night.     Subjective:   HPI: Alexis Bentley is a 76 y.o. female  with history of HTN presenting on 07/20/2022 for Cough (Productive with green mucous. 10 days post covid. Worse at night. )   Recent COVID infection, date of onset > 10 days ago.  She initially noted sinus congestion and pressure. Chest congestion. She has noted her symptoms progressed to productive cough with green mucus in the last 3 days. Cough worsening in last few day, coughing fits.  Sinus pressure has gone away. Cough is keeping her up at night.  She is using robitussin DM but it did not help much.  No fever late in illness.  No SOB except if having a coughing fit, no wheeze  She was treated by Eugenia Pancoast NP on 07/11/22 with molnupiravir.     No history of chronic lung disease.  BP Readings from Last 3 Encounters:  07/20/22 (!) 158/62  07/11/22 (!) 141/68  06/03/22 138/70     Relevant past medical, surgical, family and social history reviewed and updated as indicated. Interim medical history since our last visit reviewed. Allergies and medications reviewed and updated. Outpatient Medications Prior to Visit  Medication Sig Dispense Refill   apixaban (ELIQUIS) 5 MG TABS tablet Take 1 tablet (5 mg total) by mouth 2 (two) times daily. 180 tablet 1   Calcium Carbonate-Vitamin D 600-400 MG-UNIT tablet Take 1 tablet by mouth 2 (two) times daily.     famotidine (PEPCID) 20 MG tablet Take 1 tablet (20 mg total) by mouth at bedtime as needed for heartburn or indigestion.     lisinopril (ZESTRIL) 20 MG tablet Take 1 tablet (20  mg total) by mouth daily. 90 tablet 3   metoprolol tartrate (LOPRESSOR) 25 MG tablet Take 1 tablet (25 mg total) by mouth 2 (two) times daily. 180 tablet 3   omeprazole (PRILOSEC) 40 MG capsule Take 1 capsule (40 mg total) by mouth daily. 90 capsule 3   vitamin B-12 (CYANOCOBALAMIN) 500 MCG tablet Take 1 tablet (500 mcg total) by mouth every Monday, Wednesday, and Friday.     No facility-administered medications prior to visit.     Per HPI unless specifically indicated in ROS section below Review of Systems  Constitutional:  Negative for fatigue and fever.  HENT:  Negative for ear pain.   Eyes:  Negative for pain.  Respiratory:  Positive for cough. Negative for chest tightness and shortness of breath.   Cardiovascular:  Negative for chest pain, palpitations and leg swelling.  Gastrointestinal:  Negative for abdominal pain.  Genitourinary:  Negative for dysuria.   Objective:  BP (!) 158/62   Pulse 63   Temp (!) 97.3 F (36.3 C) (Temporal)   Ht 5' 3.5" (1.613 m)   Wt 126 lb (57.2 kg)   SpO2 100%   BMI 21.97 kg/m   Wt Readings from Last 3 Encounters:  07/20/22 126 lb (57.2 kg)  07/11/22 124 lb (  56.2 kg)  06/03/22 124 lb 9.6 oz (56.5 kg)      Physical Exam Constitutional:      General: She is not in acute distress.    Appearance: Normal appearance. She is well-developed. She is not ill-appearing or toxic-appearing.  HENT:     Head: Normocephalic.     Right Ear: Hearing, tympanic membrane, ear canal and external ear normal. Tympanic membrane is not erythematous, retracted or bulging.     Left Ear: Hearing, tympanic membrane, ear canal and external ear normal. Tympanic membrane is not erythematous, retracted or bulging.     Nose: No mucosal edema or rhinorrhea.     Right Sinus: No maxillary sinus tenderness or frontal sinus tenderness.     Left Sinus: No maxillary sinus tenderness or frontal sinus tenderness.     Mouth/Throat:     Pharynx: Uvula midline.  Eyes:     General:  Lids are normal. Lids are everted, no foreign bodies appreciated.     Conjunctiva/sclera: Conjunctivae normal.     Pupils: Pupils are equal, round, and reactive to light.  Neck:     Thyroid: No thyroid mass or thyromegaly.     Vascular: No carotid bruit.     Trachea: Trachea normal.  Cardiovascular:     Rate and Rhythm: Normal rate and regular rhythm.     Pulses: Normal pulses.     Heart sounds: Normal heart sounds, S1 normal and S2 normal. No murmur heard.    No friction rub. No gallop.  Pulmonary:     Effort: Pulmonary effort is normal. No tachypnea or respiratory distress.     Breath sounds: Normal breath sounds. No decreased breath sounds, wheezing, rhonchi or rales.  Abdominal:     General: Bowel sounds are normal.     Palpations: Abdomen is soft.     Tenderness: There is no abdominal tenderness.  Musculoskeletal:     Cervical back: Normal range of motion and neck supple.  Skin:    General: Skin is warm and dry.     Findings: No rash.  Neurological:     Mental Status: She is alert.  Psychiatric:        Mood and Affect: Mood is not anxious or depressed.        Speech: Speech normal.        Behavior: Behavior normal. Behavior is cooperative.        Thought Content: Thought content normal.        Judgment: Judgment normal.       Results for orders placed or performed in visit on 04/27/22  VITAMIN D 25 Hydroxy (Vit-D Deficiency, Fractures)  Result Value Ref Range   VITD 59.86 30.00 - 100.00 ng/mL  Vitamin B12  Result Value Ref Range   Vitamin B-12 773 211 - 911 pg/mL  CBC with Differential/Platelet  Result Value Ref Range   WBC 4.3 4.0 - 10.5 K/uL   RBC 4.09 3.87 - 5.11 Mil/uL   Hemoglobin 12.8 12.0 - 15.0 g/dL   HCT 37.3 36.0 - 46.0 %   MCV 91.4 78.0 - 100.0 fl   MCHC 34.2 30.0 - 36.0 g/dL   RDW 12.9 11.5 - 15.5 %   Platelets 207.0 150.0 - 400.0 K/uL   Neutrophils Relative % 46.4 43.0 - 77.0 %   Lymphocytes Relative 39.0 12.0 - 46.0 %   Monocytes Relative 8.1  3.0 - 12.0 %   Eosinophils Relative 5.2 (H) 0.0 - 5.0 %   Basophils Relative 1.3  0.0 - 3.0 %   Neutro Abs 2.0 1.4 - 7.7 K/uL   Lymphs Abs 1.7 0.7 - 4.0 K/uL   Monocytes Absolute 0.3 0.1 - 1.0 K/uL   Eosinophils Absolute 0.2 0.0 - 0.7 K/uL   Basophils Absolute 0.1 0.0 - 0.1 K/uL  Comprehensive metabolic panel  Result Value Ref Range   Sodium 138 135 - 145 mEq/L   Potassium 4.9 3.5 - 5.1 mEq/L   Chloride 103 96 - 112 mEq/L   CO2 29 19 - 32 mEq/L   Glucose, Bld 86 70 - 99 mg/dL   BUN 25 (H) 6 - 23 mg/dL   Creatinine, Ser 1.09 0.40 - 1.20 mg/dL   Total Bilirubin 0.4 0.2 - 1.2 mg/dL   Alkaline Phosphatase 82 39 - 117 U/L   AST 26 0 - 37 U/L   ALT 17 0 - 35 U/L   Total Protein 7.2 6.0 - 8.3 g/dL   Albumin 4.2 3.5 - 5.2 g/dL   GFR 50.22 (L) >60.00 mL/min   Calcium 9.6 8.4 - 10.5 mg/dL  Lipid panel  Result Value Ref Range   Cholesterol 207 (H) 0 - 200 mg/dL   Triglycerides 77.0 0.0 - 149.0 mg/dL   HDL 74.80 >39.00 mg/dL   VLDL 15.4 0.0 - 40.0 mg/dL   LDL Cholesterol 116 (H) 0 - 99 mg/dL   Total CHOL/HDL Ratio 3    NonHDL 131.85      COVID 19 screen:  No recent travel or known exposure to COVID19 The patient denies respiratory symptoms of COVID 19 at this time. The importance of social distancing was discussed today.   Assessment and Plan    Problem List Items Addressed This Visit     Acute bronchitis due to other specified organisms   Relevant Medications   azithromycin (ZITHROMAX) 250 MG tablet   COVID-19 - Primary   Relevant Medications   azithromycin (ZITHROMAX) 250 MG tablet   Acute, initial improving COVID symptoms with now significant worsening in cough.  Will treat as possible bacterial superinfection with azithromycin x5 days.  She will fill prescription for cough suppressant to use at bedtime to help her sleep.  She will continue rest fluids and time.  ER and return precautions given.  Eliezer Lofts, MD

## 2022-07-20 NOTE — Patient Instructions (Signed)
Complete course of antibiotics.  Can start prescription cough syrup for cough at night.  Continue with rest and fluids. Call if not continue to improve as expected.  Go to the emergency room if severe shortness of breath.

## 2022-08-09 DIAGNOSIS — Z23 Encounter for immunization: Secondary | ICD-10-CM | POA: Diagnosis not present

## 2022-08-19 DIAGNOSIS — Z23 Encounter for immunization: Secondary | ICD-10-CM | POA: Diagnosis not present

## 2022-10-20 DIAGNOSIS — H04123 Dry eye syndrome of bilateral lacrimal glands: Secondary | ICD-10-CM | POA: Diagnosis not present

## 2022-10-20 DIAGNOSIS — H26493 Other secondary cataract, bilateral: Secondary | ICD-10-CM | POA: Diagnosis not present

## 2022-10-20 DIAGNOSIS — D3132 Benign neoplasm of left choroid: Secondary | ICD-10-CM | POA: Diagnosis not present

## 2022-10-20 DIAGNOSIS — H31021 Solar retinopathy, right eye: Secondary | ICD-10-CM | POA: Diagnosis not present

## 2022-10-21 ENCOUNTER — Encounter: Payer: Self-pay | Admitting: Family Medicine

## 2022-10-21 DIAGNOSIS — D3132 Benign neoplasm of left choroid: Secondary | ICD-10-CM | POA: Insufficient documentation

## 2022-10-30 IMAGING — MG MM DIGITAL SCREENING BILAT W/ TOMO AND CAD
6 of 10 series · 6 of 30 positions shown · non-contrast
Comparison: Previous exam(s).

CLINICAL DATA: Screening.

EXAM:
DIGITAL SCREENING BILATERAL MAMMOGRAM WITH TOMOSYNTHESIS AND CAD
TECHNIQUE: Bilateral screening digital craniocaudal and mediolateral oblique
mammograms were obtained. Bilateral screening digital breast
tomosynthesis was performed. The images were evaluated with
computer-aided detection.

[L MLO synth-2D (1 of 2)]
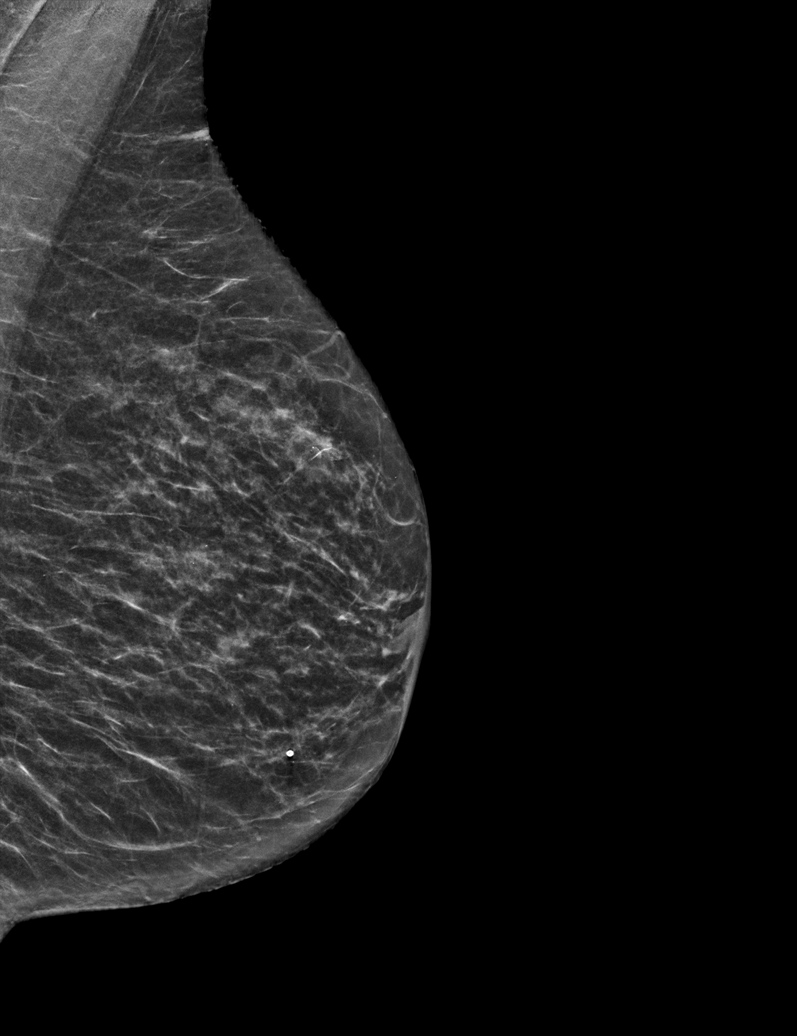

[L MLO synth-2D (2 of 2)]
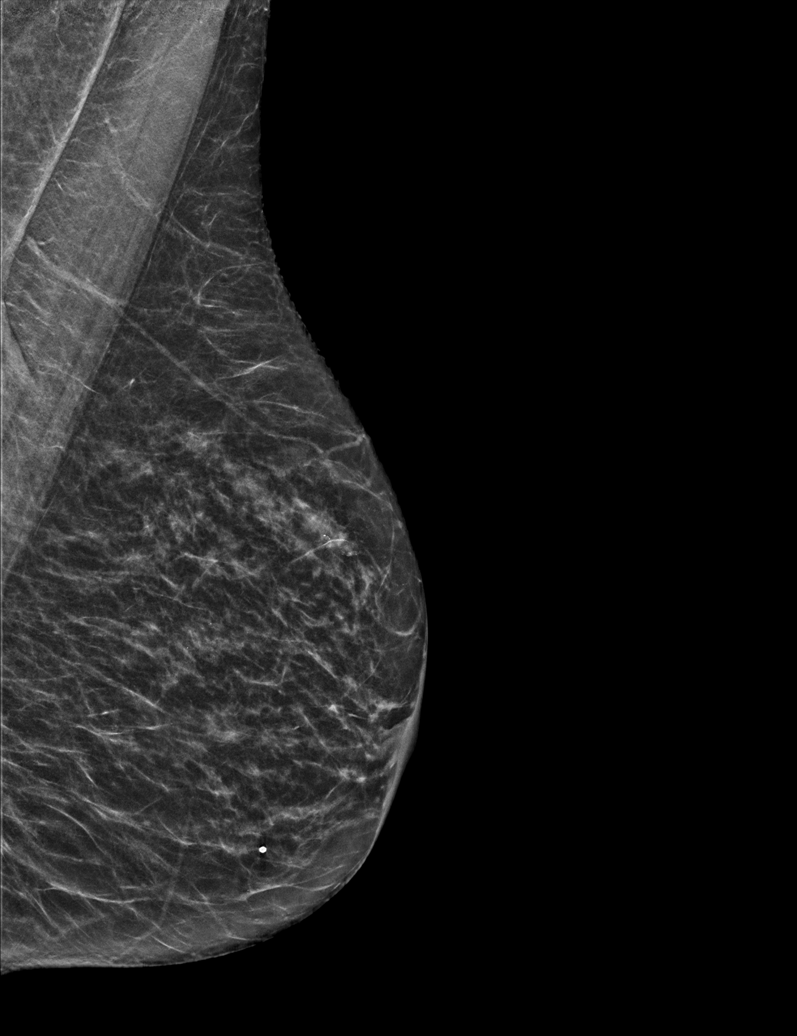

[R CC synth-2D]
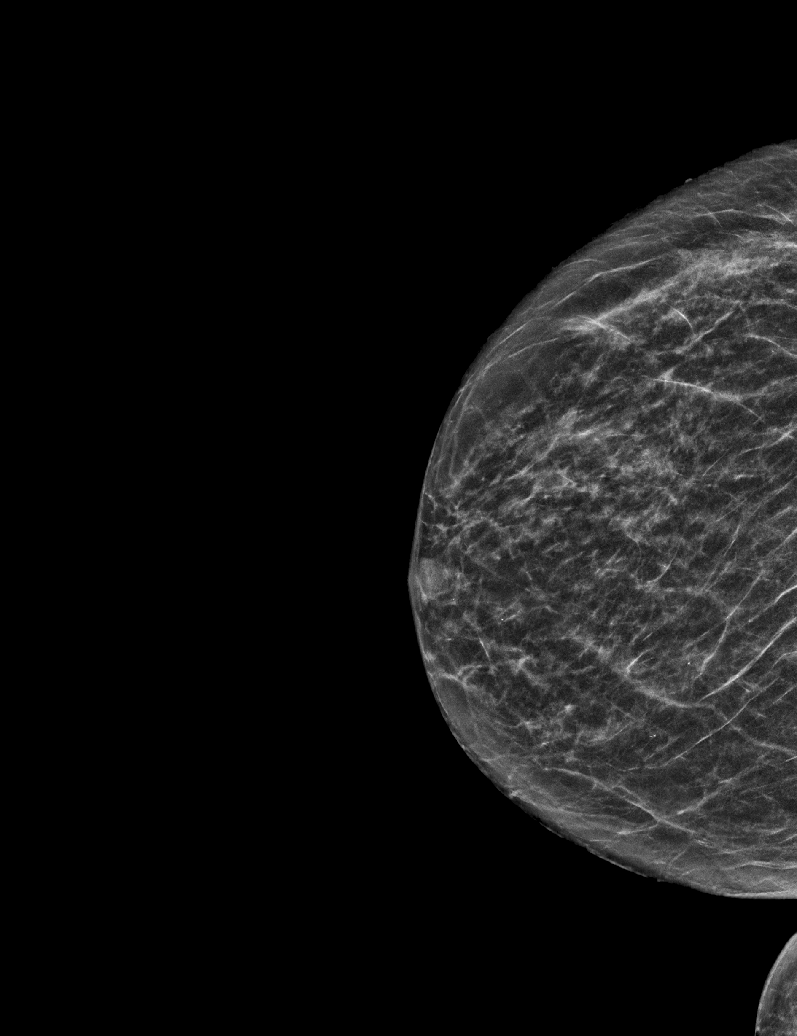

[L CC synth-2D]
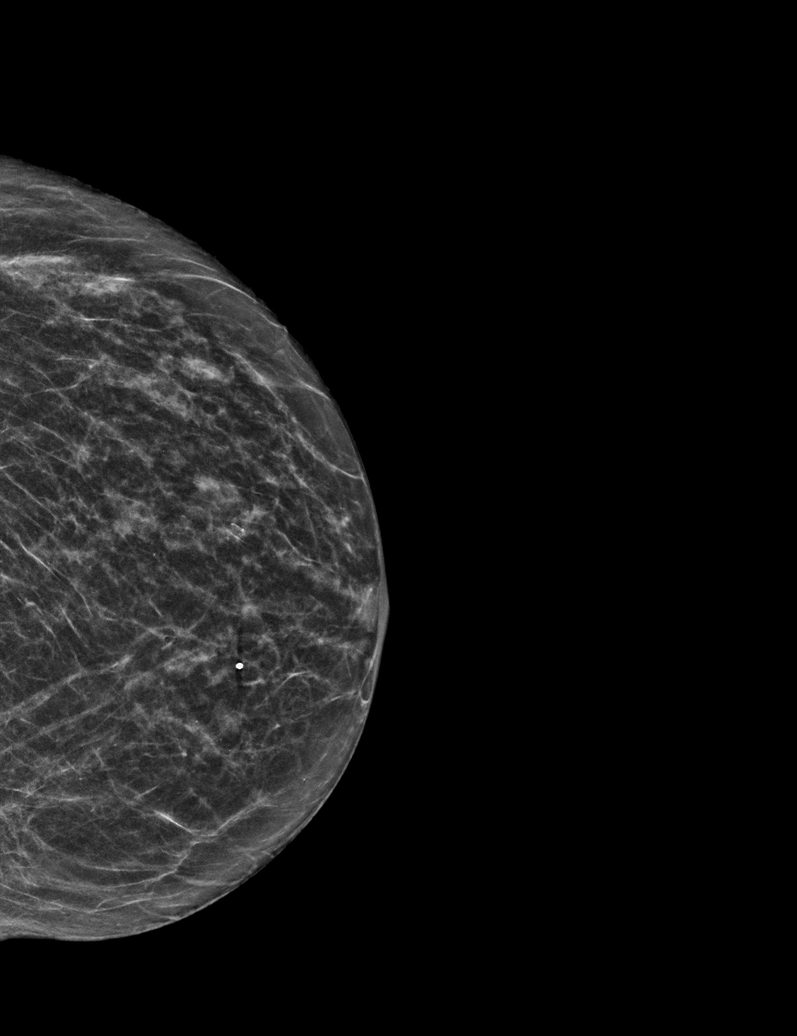

[R MLO synth-2D]
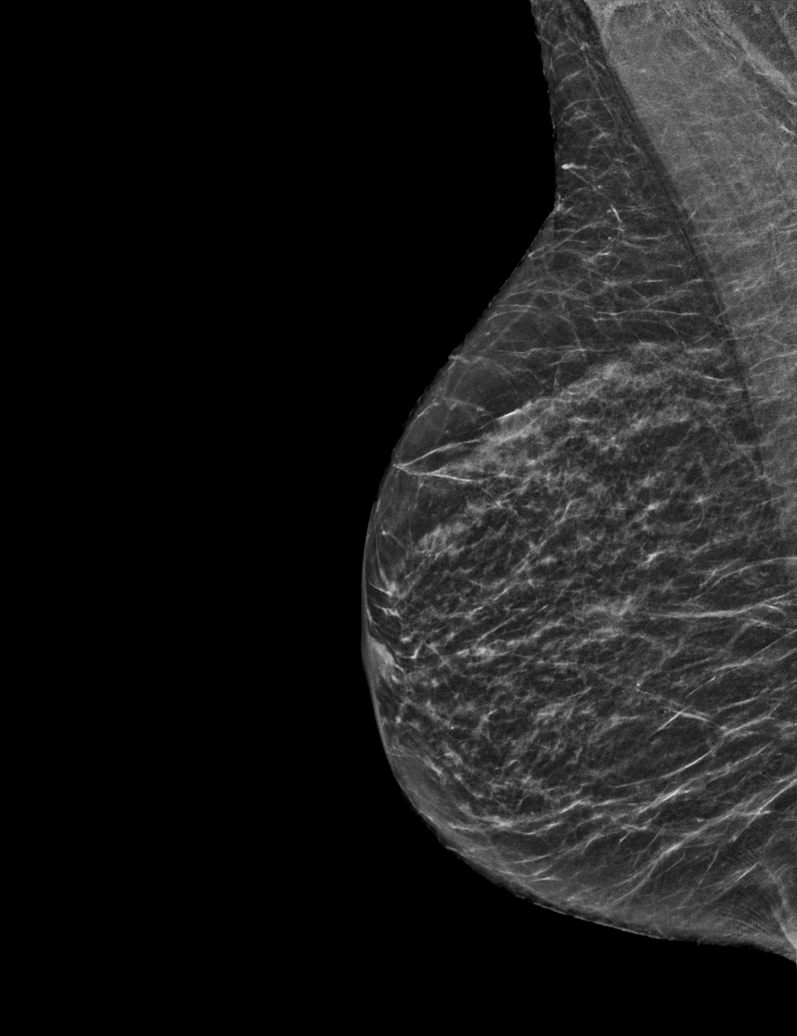

[R CC tomo · tomo slice 23/44.0]
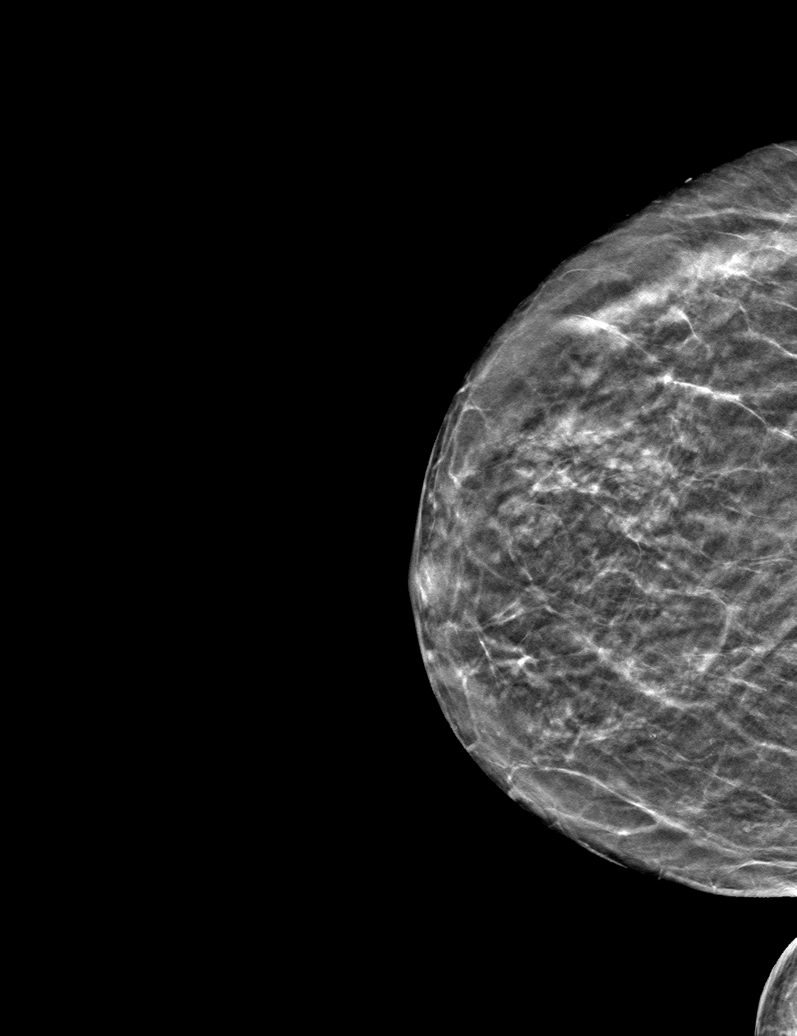

[6 of 30 positions shown; findings below may reference images not displayed]

ACR Breast Density Category b: There are scattered areas of
fibroglandular density.
FINDINGS: There are no findings suspicious for malignancy.
IMPRESSION: No mammographic evidence of malignancy. A result letter of this
screening mammogram will be mailed directly to the patient.

RECOMMENDATION:
Screening mammogram in one year. (Code:51-O-LD2)

BI-RADS CATEGORY  1: Negative.

## 2022-12-26 ENCOUNTER — Encounter: Payer: Self-pay | Admitting: Nurse Practitioner

## 2022-12-26 ENCOUNTER — Ambulatory Visit (INDEPENDENT_AMBULATORY_CARE_PROVIDER_SITE_OTHER): Payer: Medicare Other | Admitting: Nurse Practitioner

## 2022-12-26 VITALS — BP 148/70 | HR 71 | Temp 98.1°F | Resp 16 | Ht 63.5 in | Wt 129.4 lb

## 2022-12-26 DIAGNOSIS — G4483 Primary cough headache: Secondary | ICD-10-CM

## 2022-12-26 DIAGNOSIS — R051 Acute cough: Secondary | ICD-10-CM | POA: Diagnosis not present

## 2022-12-26 LAB — POC COVID19 BINAXNOW: SARS Coronavirus 2 Ag: NEGATIVE

## 2022-12-26 MED ORDER — FLUTICASONE PROPIONATE 50 MCG/ACT NA SUSP: 2.0000 | Freq: Every day | NASAL | 0 refills | Status: DC

## 2022-12-26 MED ORDER — BENZONATATE 100 MG PO CAPS: 100.0000 mg | ORAL_CAPSULE | Freq: Three times a day (TID) | ORAL | 0 refills | Status: DC | PRN

## 2022-12-26 MED ORDER — GUAIFENESIN-CODEINE 100-10 MG/5ML PO SOLN: 5.0000 mL | Freq: Three times a day (TID) | ORAL | 0 refills | Status: DC | PRN

## 2022-12-26 NOTE — Assessment & Plan Note (Signed)
Post tussive headache.

## 2022-12-26 NOTE — Assessment & Plan Note (Signed)
COVID test negative in office.  Likely viral in nature.  Will treat patient with guaifenesin-codeine 5 mL 3 times daily as needed sedation precautions reviewed.  Also add on Tessalon Perles 100 mg 3 times daily as needed through the day if patient cannot take the codeine cough syrup.  Also add some fluticasone nasal spray.  Patient reach out to me by Thursday if she is not improving we will consider using an antibiotic such as doxycycline for an atypical bronchitis

## 2022-12-26 NOTE — Patient Instructions (Signed)
Nice to see you today I have sent in cough medication and a nasal spray Let me know on Thursday if you are not improving. You may not be all the way better but should be improving

## 2022-12-26 NOTE — Progress Notes (Signed)
Acute Office Visit  Subjective:     Patient ID: Alexis Bentley, female    DOB: Aug 09, 1946, 77 y.o.   MRN: [id number]  Chief Complaint  Patient presents with   Cough    Coughing X 3 days     Patient is in today for coughing with a history of Atrial Fib, HTN, HLD, and anxiety   Symptoms started on Friday Covid vaccine: utd Flu vaccine: utd States that her husband was sick and went to the doctor on Tuesday and was dx with a viral infection   States that she has tried mucinex and robitussien and ricola and does not give great relief. States that she coughs all days. States that when she lays down she coughs worse.  Review of Systems  Constitutional:  Positive for malaise/fatigue. Negative for chills and fever.       Appetite is normal States that fluid is normal for her  HENT:  Positive for congestion. Negative for ear discharge, ear pain, sinus pain and sore throat.   Respiratory:  Positive for cough and sputum production (little green coming out). Negative for shortness of breath.   Cardiovascular:  Positive for chest pain.  Gastrointestinal:  Negative for abdominal pain, constipation, diarrhea, nausea and vomiting.  Musculoskeletal:  Negative for joint pain and myalgias.  Neurological:  Positive for headaches.        Objective:    BP (!) 148/70   Pulse 71   Temp 98.1 F (36.7 C)   Resp 16   Ht 5' 3.5" (1.613 m)   Wt 129 lb 6 oz (58.7 kg)   SpO2 99%   BMI 22.56 kg/m  BP Readings from Last 3 Encounters:  12/26/22 (!) 148/70  07/20/22 (!) 158/62  07/11/22 (!) 141/68   Wt Readings from Last 3 Encounters:  12/26/22 129 lb 6 oz (58.7 kg)  07/20/22 126 lb (57.2 kg)  07/11/22 124 lb (56.2 kg)      Physical Exam Vitals and nursing note reviewed.  Constitutional:      Appearance: Normal appearance.  HENT:     Right Ear: Tympanic membrane, ear canal and external ear normal.     Left Ear: Tympanic membrane, ear canal and external ear normal.     Mouth/Throat:      Mouth: Mucous membranes are moist.     Pharynx: Oropharynx is clear.  Cardiovascular:     Rate and Rhythm: Normal rate and regular rhythm.     Heart sounds: Normal heart sounds.  Pulmonary:     Effort: Pulmonary effort is normal.     Breath sounds: Normal breath sounds.  Lymphadenopathy:     Cervical: No cervical adenopathy.  Neurological:     Mental Status: She is alert.     Results for orders placed or performed in visit on 12/26/22  POC COVID-19 BinaxNow  Result Value Ref Range   SARS Coronavirus 2 Ag Negative Negative        Assessment & Plan:   Problem List Items Addressed This Visit       Other   Acute cough - Primary    COVID test negative in office.  Likely viral in nature.  Will treat patient with guaifenesin-codeine 5 mL 3 times daily as needed sedation precautions reviewed.  Also add on Tessalon Perles 100 mg 3 times daily as needed through the day if patient cannot take the codeine cough syrup.  Also add some fluticasone nasal spray.  Patient reach out to me by  Thursday if she is not improving we will consider using an antibiotic such as doxycycline for an atypical bronchitis      Relevant Medications   guaiFENesin-codeine 100-10 MG/5ML syrup   benzonatate (TESSALON) 100 MG capsule   fluticasone (FLONASE) 50 MCG/ACT nasal spray   Other Relevant Orders   POC COVID-19 BinaxNow (Completed)   Primary cough headache    Post tussive headache.       Meds ordered this encounter  Medications   guaiFENesin-codeine 100-10 MG/5ML syrup    Sig: Take 5 mLs by mouth 3 (three) times daily as needed for cough.    Dispense:  120 mL    Refill:  0    Order Specific Question:   Supervising Provider    Answer:   Loura Pardon A [[id number]]   benzonatate (TESSALON) 100 MG capsule    Sig: Take 1 capsule (100 mg total) by mouth 3 (three) times daily as needed for cough.    Dispense:  21 capsule    Refill:  0    Order Specific Question:   Supervising Provider    Answer:    Glori Bickers MARNE A [[id number]]   fluticasone (FLONASE) 50 MCG/ACT nasal spray    Sig: Place 2 sprays into both nostrils daily.    Dispense:  16 g    Refill:  0    Order Specific Question:   Supervising Provider    Answer:   TOWER, MARNE A [[id number]]    Return if symptoms worsen or fail to improve.  Romilda Garret, NP

## 2022-12-28 ENCOUNTER — Telehealth: Payer: Self-pay | Admitting: Family Medicine

## 2022-12-28 ENCOUNTER — Other Ambulatory Visit: Payer: Self-pay | Admitting: Student

## 2022-12-28 MED ORDER — DOXYCYCLINE HYCLATE 100 MG PO TABS: 100.0000 mg | ORAL_TABLET | Freq: Two times a day (BID) | ORAL | 0 refills | Status: AC

## 2022-12-28 NOTE — Addendum Note (Signed)
Addended by: Michela Pitcher on: 12/28/2022 12:39 PM   Modules accepted: Orders

## 2022-12-28 NOTE — Telephone Encounter (Signed)
Called and informed pt that medication has been sent in, and she stated that her husband has already picked up from the pharmacy.

## 2022-12-28 NOTE — Telephone Encounter (Signed)
Patient called in and stated that she seen Romilda Garret on Monday. She stated that he said if she wasn't feeling any better to call in and he would send in something for her. She stated that she isn't feeling any better and she is having chills. Please advise. Thank you!

## 2022-12-28 NOTE — Telephone Encounter (Signed)
Pt last saw Alexis Bentley, [place] on 06/03/22, last labs 04/27/22 Creat 1.09, age 77, weight 58.7kg, based on specified criteria pt is on appropriate dosage of Eliquis 5mg  BID for afib.  Will refill rx.

## 2022-12-28 NOTE — Telephone Encounter (Signed)
Antibiotic of doxycycline sent in

## 2023-01-11 ENCOUNTER — Telehealth: Payer: Self-pay | Admitting: Cardiology

## 2023-01-11 MED ORDER — APIXABAN 5 MG PO TABS: 5.0000 mg | ORAL_TABLET | Freq: Two times a day (BID) | ORAL | 5 refills | Status: DC

## 2023-01-11 NOTE — Telephone Encounter (Signed)
*  STAT* If patient is at the pharmacy, call can be transferred to refill team.   1. Which medications need to be refilled? (please list name of each medication and dose if known) ELIQUIS 5 MG TABS tablet   2. Which pharmacy/location (including street and city if local pharmacy) is medication to be sent to?  [id number] - [place], [place] - [address]    3. Do they need a 30 day or 90 day supply? 30 days   Pt states that this medication is out of refills.

## 2023-01-11 NOTE — Telephone Encounter (Signed)
Pt last saw Andy Tillery, [place] on 06/03/22, last labs 04/27/22 Creat 1.09, age 76, weight 58.7kg, based on specified criteria pt is on appropriate dosage of Eliquis 5mg BID for afib.  Will refill rx.  

## 2023-01-17 ENCOUNTER — Other Ambulatory Visit: Payer: Self-pay | Admitting: Nurse Practitioner

## 2023-01-17 DIAGNOSIS — R051 Acute cough: Secondary | ICD-10-CM

## 2023-02-01 ENCOUNTER — Ambulatory Visit (INDEPENDENT_AMBULATORY_CARE_PROVIDER_SITE_OTHER): Payer: Medicare Other | Admitting: Family Medicine

## 2023-02-01 ENCOUNTER — Ambulatory Visit: Payer: Medicare Other | Admitting: Family Medicine

## 2023-02-01 ENCOUNTER — Encounter: Payer: Self-pay | Admitting: Family Medicine

## 2023-02-01 VITALS — BP 132/66 | HR 70 | Temp 97.3°F | Ht 63.5 in | Wt 124.2 lb

## 2023-02-01 DIAGNOSIS — R5383 Other fatigue: Secondary | ICD-10-CM

## 2023-02-01 DIAGNOSIS — M810 Age-related osteoporosis without current pathological fracture: Secondary | ICD-10-CM

## 2023-02-01 DIAGNOSIS — E785 Hyperlipidemia, unspecified: Secondary | ICD-10-CM

## 2023-02-01 DIAGNOSIS — R5381 Other malaise: Secondary | ICD-10-CM | POA: Insufficient documentation

## 2023-02-01 DIAGNOSIS — M79645 Pain in left finger(s): Secondary | ICD-10-CM | POA: Diagnosis not present

## 2023-02-01 LAB — LIPID PANEL
Cholesterol: 200 mg/dL (ref 0–200)
HDL: 74 mg/dL (ref >39.00)
LDL Cholesterol: 99 mg/dL (ref 0–99)
NonHDL: 125.51
Total CHOL/HDL Ratio: 3
Triglycerides: 133 mg/dL (ref 0.0–149.0)
VLDL: 26.6 mg/dL (ref 0.0–40.0)

## 2023-02-01 LAB — CBC WITH DIFFERENTIAL/PLATELET
Basophils Absolute: 0.1 10*3/uL (ref 0.0–0.1)
Basophils Relative: 1.3 % (ref 0.0–3.0)
Eosinophils Absolute: 0.2 10*3/uL (ref 0.0–0.7)
Eosinophils Relative: 2.4 % (ref 0.0–5.0)
HCT: 37.7 % (ref 36.0–46.0)
Hemoglobin: 12.6 g/dL (ref 12.0–15.0)
Lymphocytes Relative: 32.5 % (ref 12.0–46.0)
Lymphs Abs: 2.4 10*3/uL (ref 0.7–4.0)
MCHC: 33.5 g/dL (ref 30.0–36.0)
MCV: 92.5 fl (ref 78.0–100.0)
Monocytes Absolute: 0.5 10*3/uL (ref 0.1–1.0)
Monocytes Relative: 6.8 % (ref 3.0–12.0)
Neutro Abs: 4.2 10*3/uL (ref 1.4–7.7)
Neutrophils Relative %: 57 % (ref 43.0–77.0)
Platelets: 254 10*3/uL (ref 150.0–400.0)
RBC: 4.08 Mil/uL (ref 3.87–5.11)
RDW: 13.8 % (ref 11.5–15.5)
WBC: 7.4 10*3/uL (ref 4.0–10.5)

## 2023-02-01 LAB — COMPREHENSIVE METABOLIC PANEL
ALT: 14 U/L (ref 0–35)
AST: 21 U/L (ref 0–37)
Albumin: 4.4 g/dL (ref 3.5–5.2)
Alkaline Phosphatase: 100 U/L (ref 39–117)
BUN: 23 mg/dL (ref 6–23)
CO2: 27 mEq/L (ref 19–32)
Calcium: 10.1 mg/dL (ref 8.4–10.5)
Chloride: 101 mEq/L (ref 96–112)
Creatinine, Ser: 0.96 mg/dL (ref 0.40–1.20)
GFR: 58.18 mL/min — ABNORMAL LOW (ref >60.00)
Glucose, Bld: 85 mg/dL (ref 70–99)
Potassium: 4.7 mEq/L (ref 3.5–5.1)
Sodium: 137 mEq/L (ref 135–145)
Total Bilirubin: 0.5 mg/dL (ref 0.2–1.2)
Total Protein: 7.5 g/dL (ref 6.0–8.3)

## 2023-02-01 LAB — TSH: TSH: 1.33 u[IU]/mL (ref 0.35–5.50)

## 2023-02-01 LAB — VITAMIN D 25 HYDROXY (VIT D DEFICIENCY, FRACTURES): VITD: 51.24 ng/mL (ref 30.00–100.00)

## 2023-02-01 LAB — VITAMIN B12: Vitamin B-12: 724 pg/mL (ref 211–911)

## 2023-02-01 NOTE — Assessment & Plan Note (Signed)
Update vit D. 

## 2023-02-01 NOTE — Assessment & Plan Note (Signed)
Update FLP as fasting.  

## 2023-02-01 NOTE — Assessment & Plan Note (Addendum)
Possible trigger thumb.  Continue topical rub such as diclofenac, sparing use Rec sports med eval if ongoing concern.

## 2023-02-01 NOTE — Patient Instructions (Addendum)
Labs today to check for causes of fatigue. I'm glad you're feeling better.  Good to see you today Let us know if any recurrent or worsening symptoms. May return to see Dr Patsy Lager sports medicine if worsening left thumb pain/triggering.

## 2023-02-01 NOTE — Assessment & Plan Note (Signed)
Prolonged fatigue, malaise after respiratory infection earlier in the year.  Symptoms have improved. Notes ongoing sweating however. Check labwork for reversible causes of fatigue.

## 2023-02-01 NOTE — Progress Notes (Signed)
Ph: [phone]       Fax: [phone]   Patient ID: Alexis Bentley, female    DOB: 16-Aug-1946, 77 y.o.   MRN: [id number]  This visit was conducted in person.  BP 132/66   Pulse 70   Temp (!) 97.3 F (36.3 C) (Temporal)   Ht 5' 3.5" (1.613 m)   Wt 124 lb 4 oz (56.4 kg)   SpO2 98%   BMI 21.66 kg/m    CC: fatigue Subjective:   HPI: Alexis Bentley is a 77 y.o. female presenting on 02/01/2023 for Fatigue (C/o fatigue- feeling better. Sxs started about 1.5 mos ago after a "sick spell".)   1.5 mo h/o fatigue after respiratory illness in March (see below). Tired, no energy, malaise and weakness that lasted up to 5 weeks to fully recover.   She's just recently started feeling better 1-2 weeks ago "almost 100% better" Notes ongoing mild exertional dyspnea - but also largely improved.  No more cough, no fever/chill, headache, chest pain, shortness of breath.  No unexpected weight loss.  No enlarged glands.  No urine or bowel  changes. No nausea/vomiting.  She notes occasional  trouble falling asleep, notes more frequently difficulty staying asleep.   She notes increased sweating both during day and night. Notes this over the past 1-2 months.  S/p total abd hysterectomy with BSO 2008. Didn't have hot flashes after this.  UTD mammogram, colon cancer screen.  Seen 12/26/2022 in office for acute cough, treated for respiratory infection/atypical bronchitis with doxycycline. COVID tested negative. Took her 2 weeks to start recovery.  She did have increased rhinorrhea, congestion, fever, cough.   She did have COVID infection 07/2022 treated with molnupiravir followed by azithromycin to cover bacterial infection. She did recover well after this.      Relevant past medical, surgical, family and social history reviewed and updated as indicated. Interim medical history since our last visit reviewed. Allergies and medications reviewed and updated. Outpatient Medications Prior to Visit   Medication Sig Dispense Refill   apixaban (ELIQUIS) 5 MG TABS tablet Take 1 tablet (5 mg total) by mouth 2 (two) times daily. 60 tablet 5   Calcium Carbonate-Vitamin D 600-400 MG-UNIT tablet Take 1 tablet by mouth 2 (two) times daily.     famotidine (PEPCID) 20 MG tablet Take 1 tablet (20 mg total) by mouth at bedtime as needed for heartburn or indigestion.     lisinopril (ZESTRIL) 20 MG tablet Take 1 tablet (20 mg total) by mouth daily. 90 tablet 3   metoprolol tartrate (LOPRESSOR) 25 MG tablet Take 1 tablet (25 mg total) by mouth 2 (two) times daily. 180 tablet 3   omeprazole (PRILOSEC) 40 MG capsule Take 1 capsule (40 mg total) by mouth daily. 90 capsule 3   vitamin B-12 (CYANOCOBALAMIN) 500 MCG tablet Take 1 tablet (500 mcg total) by mouth every Monday, Wednesday, and Friday.     azithromycin (ZITHROMAX) 250 MG tablet 2 tab po x 1 day then 1 tab po daily (Patient not taking: Reported on 12/26/2022) 6 tablet 0   benzonatate (TESSALON) 100 MG capsule Take 1 capsule (100 mg total) by mouth 3 (three) times daily as needed for cough. 21 capsule 0   fluticasone (FLONASE) 50 MCG/ACT nasal spray Place 2 sprays into both nostrils daily. 16 g 0   guaiFENesin-codeine 100-10 MG/5ML syrup Take 5 mLs by mouth 3 (three) times daily as needed for cough. 120 mL 0   No facility-administered medications prior  to visit.     Per HPI unless specifically indicated in ROS section below Review of Systems  Objective:  BP 132/66   Pulse 70   Temp (!) 97.3 F (36.3 C) (Temporal)   Ht 5' 3.5" (1.613 m)   Wt 124 lb 4 oz (56.4 kg)   SpO2 98%   BMI 21.66 kg/m   Wt Readings from Last 3 Encounters:  02/01/23 124 lb 4 oz (56.4 kg)  12/26/22 129 lb 6 oz (58.7 kg)  07/20/22 126 lb (57.2 kg)      Physical Exam Vitals and nursing note reviewed.  Constitutional:      Appearance: Normal appearance. She is not ill-appearing.  HENT:     Head: Normocephalic and atraumatic.     Right Ear: Tympanic membrane, ear  canal and external ear normal. There is no impacted cerumen.     Left Ear: Tympanic membrane, ear canal and external ear normal. There is no impacted cerumen.     Nose: Nose normal. No congestion or rhinorrhea.     Mouth/Throat:     Mouth: Mucous membranes are moist.     Pharynx: Oropharynx is clear. No oropharyngeal exudate or posterior oropharyngeal erythema.  Eyes:     Extraocular Movements: Extraocular movements intact.     Conjunctiva/sclera: Conjunctivae normal.     Pupils: Pupils are equal, round, and reactive to light.  Cardiovascular:     Rate and Rhythm: Normal rate and regular rhythm.     Pulses: Normal pulses.     Heart sounds: Normal heart sounds. No murmur heard. Pulmonary:     Effort: Pulmonary effort is normal. No respiratory distress.     Breath sounds: Normal breath sounds. No wheezing, rhonchi or rales.  Abdominal:     General: Abdomen is flat. Bowel sounds are normal. There is no distension.     Palpations: Abdomen is soft. There is no hepatomegaly, splenomegaly or mass.     Tenderness: There is no abdominal tenderness. There is no guarding or rebound. Negative signs include Murphy's sign.     Hernia: No hernia is present.  Musculoskeletal:     Right lower leg: No edema.     Left lower leg: No edema.     Comments: Left thumb triggering in DIP extension anterior to PIP joint, mild tenderness  Lymphadenopathy:     Head:     Right side of head: No submental, submandibular, tonsillar, preauricular or posterior auricular adenopathy.     Left side of head: No submental, submandibular, tonsillar, preauricular or posterior auricular adenopathy.     Cervical: No cervical adenopathy.     Right cervical: No superficial cervical adenopathy.    Left cervical: No superficial cervical adenopathy.     Upper Body:     Right upper body: No supraclavicular adenopathy.     Left upper body: No supraclavicular adenopathy.  Skin:    General: Skin is warm and dry.     Findings: No  rash.  Neurological:     Mental Status: She is alert.  Psychiatric:        Mood and Affect: Mood normal.        Behavior: Behavior normal.       Results for orders placed or performed in visit on 12/26/22  POC COVID-19 BinaxNow  Result Value Ref Range   SARS Coronavirus 2 Ag Negative Negative    Assessment & Plan:   Problem List Items Addressed This Visit     Osteoporosis  Update vit D      Relevant Orders   VITAMIN D 25 Hydroxy (Vit-D Deficiency, Fractures)   HLD (hyperlipidemia)    Update FLP as fasting.       Relevant Orders   Lipid panel   Comprehensive metabolic panel   Malaise and fatigue - Primary    Prolonged fatigue, malaise after respiratory infection earlier in the year.  Symptoms have improved. Notes ongoing sweating however. Check labwork for reversible causes of fatigue.       Relevant Orders   Comprehensive metabolic panel   TSH   CBC with Differential/Platelet   Vitamin B12   VITAMIN D 25 Hydroxy (Vit-D Deficiency, Fractures)   Thumb pain, left    Possible trigger thumb.  Continue topical rub such as diclofenac, sparing use Rec sports med eval if ongoing concern.         No orders of the defined types were placed in this encounter.   Orders Placed This Encounter  Procedures   Lipid panel   Comprehensive metabolic panel   TSH   CBC with Differential/Platelet   Vitamin B12   VITAMIN D 25 Hydroxy (Vit-D Deficiency, Fractures)    Patient Instructions  Labs today to check for causes of fatigue. I'm glad you're feeling better.  Good to see you today Let us know if any recurrent or worsening symptoms. May return to see Dr Patsy Lager sports medicine if worsening left thumb pain/triggering.  Follow up plan: Return if symptoms worsen or fail to improve.  Eustaquio Boyden, MD

## 2023-03-20 ENCOUNTER — Ambulatory Visit (INDEPENDENT_AMBULATORY_CARE_PROVIDER_SITE_OTHER): Payer: Medicare Other

## 2023-03-20 VITALS — Ht 65.0 in | Wt 124.0 lb

## 2023-03-20 DIAGNOSIS — Z0000 Encounter for general adult medical examination without abnormal findings: Secondary | ICD-10-CM

## 2023-03-20 DIAGNOSIS — Z1231 Encounter for screening mammogram for malignant neoplasm of breast: Secondary | ICD-10-CM

## 2023-03-20 NOTE — Patient Instructions (Signed)
Alexis Bentley , Thank you for taking time to come for your Medicare Wellness Visit. I appreciate your ongoing commitment to your health goals. Please review the following plan we discussed and let me know if I can assist you in the future.   These are the goals we discussed:  Goals      Increase physical activity     When schedule permits, I will resume exercising at least 60 minutes 3 days per week.      Patient Stated     11/15/2019, I will continue walking 3 miles a week.     Patient Stated     Maintain current health status.        This is a list of the screening recommended for you and due dates:  Health Maintenance  Topic Date Due   Zoster (Shingles) Vaccine (1 of 2) Never done   DTaP/Tdap/Td vaccine (3 - Tdap) 06/10/2018   COVID-19 Vaccine (5 - 2023-24 season) 10/14/2022   Mammogram  05/03/2023   Flu Shot  05/04/2023   Medicare Annual Wellness Visit  03/19/2024   Pneumonia Vaccine  Completed   DEXA scan (bone density measurement)  Completed   Hepatitis C Screening  Completed   HPV Vaccine  Aged Out   Colon Cancer Screening  Discontinued    Advanced directives: Please bring a copy of your health care power of attorney and living will to the office to be added to your chart at your convenience.   Conditions/risks identified: Aim for 30 minutes of exercise or brisk walking, 6-8 glasses of water, and 5 servings of fruits and vegetables each day.   Next appointment: Follow up in one year for your annual wellness visit 03/20/24 @ 9:45 telephone   Preventive Care 65 Years and Older, Female Preventive care refers to lifestyle choices and visits with your health care provider that can promote health and wellness. What does preventive care include? A yearly physical exam. This is also called an annual well check. Dental exams once or twice a year. Routine eye exams. Ask your health care provider how often you should have your eyes checked. Personal lifestyle choices,  including: Daily care of your teeth and gums. Regular physical activity. Eating a healthy diet. Avoiding tobacco and drug use. Limiting alcohol use. Practicing safe sex. Taking low-dose aspirin every day. Taking vitamin and mineral supplements as recommended by your health care provider. What happens during an annual well check? The services and screenings done by your health care provider during your annual well check will depend on your age, overall health, lifestyle risk factors, and family history of disease. Counseling  Your health care provider may ask you questions about your: Alcohol use. Tobacco use. Drug use. Emotional well-being. Home and relationship well-being. Sexual activity. Eating habits. History of falls. Memory and ability to understand (cognition). Work and work Astronomer. Reproductive health. Screening  You may have the following tests or measurements: Height, weight, and BMI. Blood pressure. Lipid and cholesterol levels. These may be checked every 5 years, or more frequently if you are over 12 years old. Skin check. Lung cancer screening. You may have this screening every year starting at age 46 if you have a 30-pack-year history of smoking and currently smoke or have quit within the past 15 years. Fecal occult blood test (FOBT) of the stool. You may have this test every year starting at age 64. Flexible sigmoidoscopy or colonoscopy. You may have a sigmoidoscopy every 5 years or a colonoscopy every  10 years starting at age 60. Hepatitis C blood test. Hepatitis B blood test. Sexually transmitted disease (STD) testing. Diabetes screening. This is done by checking your blood sugar (glucose) after you have not eaten for a while (fasting). You may have this done every 1-3 years. Bone density scan. This is done to screen for osteoporosis. You may have this done starting at age 57. Mammogram. This may be done every 1-2 years. Talk to your health care provider  about how often you should have regular mammograms. Talk with your health care provider about your test results, treatment options, and if necessary, the need for more tests. Vaccines  Your health care provider may recommend certain vaccines, such as: Influenza vaccine. This is recommended every year. Tetanus, diphtheria, and acellular pertussis (Tdap, Td) vaccine. You may need a Td booster every 10 years. Zoster vaccine. You may need this after age 23. Pneumococcal 13-valent conjugate (PCV13) vaccine. One dose is recommended after age 53. Pneumococcal polysaccharide (PPSV23) vaccine. One dose is recommended after age 60. Talk to your health care provider about which screenings and vaccines you need and how often you need them. This information is not intended to replace advice given to you by your health care provider. Make sure you discuss any questions you have with your health care provider. Document Released: 10/16/2015 Document Revised: 06/08/2016 Document Reviewed: 07/21/2015 Elsevier Interactive Patient Education  2017 [place] Prevention in the Home Falls can cause injuries. They can happen to people of all ages. There are many things you can do to make your home safe and to help prevent falls. What can I do on the outside of my home? Regularly fix the edges of walkways and driveways and fix any cracks. Remove anything that might make you trip as you walk through a door, such as a raised step or threshold. Trim any bushes or trees on the path to your home. Use bright outdoor lighting. Clear any walking paths of anything that might make someone trip, such as rocks or tools. Regularly check to see if handrails are loose or broken. Make sure that both sides of any steps have handrails. Any raised decks and porches should have guardrails on the edges. Have any leaves, snow, or ice cleared regularly. Use sand or salt on walking paths during winter. Clean up any spills in  your garage right away. This includes oil or grease spills. What can I do in the bathroom? Use night lights. Install grab bars by the toilet and in the tub and shower. Do not use towel bars as grab bars. Use non-skid mats or decals in the tub or shower. If you need to sit down in the shower, use a plastic, non-slip stool. Keep the floor dry. Clean up any water that spills on the floor as soon as it happens. Remove soap buildup in the tub or shower regularly. Attach bath mats securely with double-sided non-slip rug tape. Do not have throw rugs and other things on the floor that can make you trip. What can I do in the bedroom? Use night lights. Make sure that you have a light by your bed that is easy to reach. Do not use any sheets or blankets that are too big for your bed. They should not hang down onto the floor. Have a firm chair that has side arms. You can use this for support while you get dressed. Do not have throw rugs and other things on the floor that can make you  trip. What can I do in the kitchen? Clean up any spills right away. Avoid walking on wet floors. Keep items that you use a lot in easy-to-reach places. If you need to reach something above you, use a strong step stool that has a grab bar. Keep electrical cords out of the way. Do not use floor polish or wax that makes floors slippery. If you must use wax, use non-skid floor wax. Do not have throw rugs and other things on the floor that can make you trip. What can I do with my stairs? Do not leave any items on the stairs. Make sure that there are handrails on both sides of the stairs and use them. Fix handrails that are broken or loose. Make sure that handrails are as long as the stairways. Check any carpeting to make sure that it is firmly attached to the stairs. Fix any carpet that is loose or worn. Avoid having throw rugs at the top or bottom of the stairs. If you do have throw rugs, attach them to the floor with carpet  tape. Make sure that you have a light switch at the top of the stairs and the bottom of the stairs. If you do not have them, ask someone to add them for you. What else can I do to help prevent falls? Wear shoes that: Do not have high heels. Have rubber bottoms. Are comfortable and fit you well. Are closed at the toe. Do not wear sandals. If you use a stepladder: Make sure that it is fully opened. Do not climb a closed stepladder. Make sure that both sides of the stepladder are locked into place. Ask someone to hold it for you, if possible. Clearly mark and make sure that you can see: Any grab bars or handrails. First and last steps. Where the edge of each step is. Use tools that help you move around (mobility aids) if they are needed. These include: Canes. Walkers. Scooters. Crutches. Turn on the lights when you go into a dark area. Replace any light bulbs as soon as they burn out. Set up your furniture so you have a clear path. Avoid moving your furniture around. If any of your floors are uneven, fix them. If there are any pets around you, be aware of where they are. Review your medicines with your doctor. Some medicines can make you feel dizzy. This can increase your chance of falling. Ask your doctor what other things that you can do to help prevent falls. This information is not intended to replace advice given to you by your health care provider. Make sure you discuss any questions you have with your health care provider. Document Released: 07/16/2009 Document Revised: 02/25/2016 Document Reviewed: 10/24/2014 Elsevier Interactive Patient Education  2017 [employer].

## 2023-03-20 NOTE — Progress Notes (Signed)
I connected with  Rosemarie Beath on 03/20/23 by a audio enabled telemedicine application and verified that I am speaking with the correct person using two identifiers.  Patient Location: Home  Provider Location: Office/Clinic  I discussed the limitations of evaluation and management by telemedicine. The patient expressed understanding and agreed to proceed.  Subjective:   CURTISTINE POPPA is a 77 y.o. female who presents for Medicare Annual (Subsequent) preventive examination.  Review of Systems      Cardiac Risk Factors include: advanced age (>36men, >70 women);hypertension;sedentary lifestyle     Objective:    Today's Vitals   03/20/23 0942  Weight: 124 lb (56.2 kg)  Height: 5\' 5"  (1.651 m)   Body mass index is 20.63 kg/m.     03/20/2023    9:53 AM 03/15/2022   12:06 PM 11/15/2019    2:49 PM 11/13/2018   10:54 AM 11/08/2017   11:56 AM 07/08/2016   11:45 AM  Advanced Directives  Does Patient Have a Medical Advance Directive? Yes Yes Yes No No No  Type of Estate agent of [place];Living will Healthcare Power of [place];Living will Living will;Healthcare Power of Attorney     Copy of Healthcare Power of Attorney in Chart? No - copy requested No - copy requested No - copy requested     Would patient like information on creating a medical advance directive?    No - Patient declined No - Patient declined Yes - Educational materials given    Current Medications (verified) Outpatient Encounter Medications as of 03/20/2023  Medication Sig   apixaban (ELIQUIS) 5 MG TABS tablet Take 1 tablet (5 mg total) by mouth 2 (two) times daily.   Calcium Carbonate-Vitamin D 600-400 MG-UNIT tablet Take 1 tablet by mouth 2 (two) times daily.   famotidine (PEPCID) 20 MG tablet Take 1 tablet (20 mg total) by mouth at bedtime as needed for heartburn or indigestion.   lisinopril (ZESTRIL) 20 MG tablet Take 1 tablet (20 mg total) by mouth daily.   metoprolol tartrate (LOPRESSOR) 25  MG tablet Take 1 tablet (25 mg total) by mouth 2 (two) times daily.   omeprazole (PRILOSEC) 40 MG capsule Take 1 capsule (40 mg total) by mouth daily.   vitamin B-12 (CYANOCOBALAMIN) 500 MCG tablet Take 1 tablet (500 mcg total) by mouth every Monday, Wednesday, and Friday.   No facility-administered encounter medications on file as of 03/20/2023.    Allergies (verified) Patient has no known allergies.   History: Past Medical History:  Diagnosis Date   Chest pain    Erosive gastritis    Gallbladder polyp 10/2016   by [place] 4.76mm   GERD (gastroesophageal reflux disease)    Hemorrhoids    Hiatal hernia    Hypertension    Lumbar back pain    With radiculopathy   Osteoporosis 01/07/2015   T-2.5 hip   Paroxysmal atrial fibrillation (HCC) 2010   one episode   Peptic ulcer disease    PVD (posterior vitreous detachment), bilateral 10/13/2016   Followed by ophtho Dr Alben Spittle @ [facility]   Rectal prolapse    Syncope 03/2015   stable head CT, echo, carotids, labwork, EKG   Transaminitis 2015   normal iron, viral hep panel, stable abd [place] (benign gallbladder polyp)   Past Surgical History:  Procedure Laterality Date   cardiac catherization  08/07/2009   NML   CATARACT EXTRACTION Bilateral 2010   COLONOSCOPY  12/03/2006   Internal/ external hemorrhoids, no polyps   COLONOSCOPY  01/2018   WNL, no rpt recommended [facility])   DEXA  03/2015   T -2.5 hip   ESOPHAGOGASTRODUODENOSCOPY  04/27/2007   Erosive and ulcerative gastritis; small H.H.   ESOPHAGOGASTRODUODENOSCOPY  10/2016   WNL - sxs attributed to stress [facility])   HEMORRHOID BANDING  11/2021   Repeat March 2023   OOPHORECTOMY     PARTIAL HYSTERECTOMY  1981   Bleeding, cyst, bladder tack   RECTAL PROLAPSE REPAIR  2005   TOTAL ABDOMINAL HYSTERECTOMY  2008   With a bilateral salpingo- oophorectomy   Family History  Problem Relation Age of Onset   Hypertension Mother    COPD Mother        Prior smoker   Coronary artery disease  Mother        stents   Heart attack Father    Hypertension Father    Stroke Father        Ministrokes   Coronary artery disease Father        x4 stents, Pacer   Colon cancer Father    Alzheimer's disease Father    Heart disease Sister    Testicular cancer Brother    Prostate cancer Brother    Diabetes Mellitus I Son    Breast cancer Cousin    Stomach cancer Neg Hx    Rectal cancer Neg Hx    Esophageal cancer Neg Hx    Liver cancer Neg Hx    Social History   Socioeconomic History   Marital status: Married    Spouse name: Not on file   Number of children: 2   Years of education: Not on file   Highest education level: Not on file  Occupational History   Occupation: retired    Associate Professor: [employer]  Tobacco Use   Smoking status: Never   Smokeless tobacco: Never  Vaping Use   Vaping Use: Never used  Substance and Sexual Activity   Alcohol use: No    Alcohol/week: 0.0 standard drinks of alcohol   Drug use: No   Sexual activity: Yes    Birth control/protection: Surgical, Post-menopausal  Other Topics Concern   Not on file  Social History Narrative   Married   Lives with husband.  2 grown children   Daily caffeine use   Retired 2013 was [employer]   Activity: walking   Diet: good water intake, fruits/vegetables daily   Social Determinants of Health   Financial Resource Strain: Low Risk  (03/20/2023)   Overall Financial Resource Strain (CARDIA)    Difficulty of Paying Living Expenses: Not hard at all  Food Insecurity: No Food Insecurity (03/20/2023)   Hunger Vital Sign    Worried About Running Out of Food in the Last Year: Never true    Ran Out of Food in the Last Year: Never true  Transportation Needs: No Transportation Needs (03/20/2023)   PRAPARE - Administrator, Civil Service (Medical): No    Lack of Transportation (Non-Medical): No  Physical Activity: Insufficiently Active (03/20/2023)   Exercise Vital Sign    Days of  Exercise per Week: 1 day    Minutes of Exercise per Session: 40 min  Stress: No Stress Concern Present (03/20/2023)   [employer] of Occupational Health - Occupational Stress Questionnaire    Feeling of Stress : Not at all  Social Connections: Moderately Integrated (03/20/2023)   Social Connection and Isolation Panel [NHANES]    Frequency of Communication with Friends and Family: More than three  times a week    Frequency of Social Gatherings with Friends and Family: More than three times a week    Attends Religious Services: More than 4 times per year    Active Member of [employer] or Organizations: No    Attends Engineer, structural: Never    Marital Status: Married    Tobacco Counseling Counseling given: Not Answered   Clinical Intake:  Pre-visit preparation completed: Yes  Pain : No/denies pain     Nutritional Risks: None Diabetes: No  How often do you need to have someone help you when you read instructions, pamphlets, or other written materials from your doctor or pharmacy?: 1 - Never  Diabetic? no  Interpreter Needed?: No  Information entered by :: C.Terrall Bley LPN   Activities of Daily Living    03/20/2023    9:54 AM 03/19/2023   12:37 PM  In your present state of health, do you have any difficulty performing the following activities:  Hearing? 0 0  Vision? 0 0  Difficulty concentrating or making decisions? 0 0  Walking or climbing stairs? 0 0  Dressing or bathing? 0 0  Doing errands, shopping? 0 0  Preparing Food and eating ? N N  Using the Toilet? N N  In the past six months, have you accidently leaked urine? Y N  Comment occasionally   Do you have problems with loss of bowel control? N N  Managing your Medications? N N  Managing your Finances? N N  Housekeeping or managing your Housekeeping? N N    Patient Care Team: Eustaquio Boyden, MD as PCP - General (Family Medicine) Lanier Prude, MD as PCP - Electrophysiology  (Cardiology) Iva Boop, MD as Consulting Physician (Gastroenterology) Hillis Range, MD (Inactive) as Consulting Physician (Cardiology) Mateo Flow, MD as Consulting Physician (Ophthalmology)  Indicate any recent Medical Services you may have received from other than [facility] providers in the past year (date may be approximate).     Assessment:   This is a routine wellness examination for [facility].  Hearing/Vision screen Hearing Screening - Comments:: No hearing issues Vision Screening - Comments:: Glasses-[facility]  Dietary issues and exercise activities discussed: Current Exercise Habits: Home exercise routine, Time (Minutes): 45, Frequency (Times/Week): 1, Weekly Exercise (Minutes/Week): 45, Intensity: Mild, Exercise limited by: None identified   Goals Addressed             This Visit's Progress    Patient Stated       Maintain current health status.       Depression Screen    03/20/2023    9:52 AM 02/01/2023   11:43 AM 03/15/2022   12:04 PM 11/15/2019    2:52 PM 11/13/2018   10:36 AM 11/08/2017   11:35 AM 07/08/2016   11:47 AM  PHQ 2/9 Scores  PHQ - 2 Score 0 0 0 0 0 0 0  PHQ- 9 Score 0 2  0 0 0     Fall Risk    03/20/2023    9:54 AM 03/19/2023   12:37 PM 02/01/2023   11:43 AM 03/15/2022   12:06 PM 11/15/2019    2:50 PM  Fall Risk   Falls in the past year? 0 0 0 0 0  Number falls in past yr: 0   0 0  Injury with Fall? 0   0 0  Risk for fall due to : No Fall Risks   No Fall Risks No Fall Risks  Follow up Falls prevention discussed;Falls evaluation  completed   Falls prevention discussed Falls evaluation completed;Falls prevention discussed    FALL RISK PREVENTION PERTAINING TO THE HOME:  Any stairs in or around the home? No  If so, are there any without handrails? No  Home free of loose throw rugs in walkways, pet beds, electrical cords, etc? Yes  Adequate lighting in your home to reduce risk of falls? Yes   ASSISTIVE DEVICES UTILIZED TO PREVENT FALLS:  Life  alert? No  Use of a cane, walker or w/c? No  Grab bars in the bathroom? Yes  Shower chair or bench in shower? Yes  Elevated toilet seat or a handicapped toilet? Yes    Cognitive Function:    11/15/2019    2:57 PM 11/13/2018   10:36 AM 11/08/2017   11:35 AM 07/08/2016   11:51 AM  MMSE - Mini Mental State Exam  Orientation to time 5 5 5 5   Orientation to Place 5 5 5 5   Registration 3 3 3 3   Attention/ Calculation 5 0 0 0  Recall 3 3 3 3   Language- name 2 objects  0 0 0  Language- repeat 1 1 1 1   Language- follow 3 step command  3 3 3   Language- read & follow direction  0 0 0  Write a sentence  0 0 0  Copy design  0 0 0  Total score  20 20 20         03/20/2023    9:55 AM 03/15/2022   12:09 PM  6CIT Screen  What Year? 0 points 0 points  What month? 0 points 0 points  What time? 0 points 0 points  Count back from 20 0 points 0 points  Months in reverse 0 points 0 points  Repeat phrase 0 points 0 points  Total Score 0 points 0 points    Immunizations Immunization History  Administered Date(s) Administered   COVID-19, mRNA, vaccine(Comirnaty)12 years and older 08/19/2022   Fluad Quad(high Dose 65+) 07/24/2019, 07/21/2020, 08/09/2022   Influenza, High Dose Seasonal PF 07/04/2018, 07/14/2021   Influenza,inj,Quad PF,6+ Mos 07/10/2014, 07/02/2015, 07/08/2016, 07/13/2017   Influenza-Unspecified 07/03/2013   PFIZER(Purple Top)SARS-COV-2 Vaccination 11/16/2019, 12/11/2019, 09/19/2020   Pneumococcal Conjugate-13 05/19/2015   Pneumococcal Polysaccharide-23 07/08/2016   Td 10/03/1993, 06/10/2008    TDAP status: Up to date  Flu Vaccine status: Up to date  Pneumococcal vaccine status: Up to date  Covid-19 vaccine status: Information provided on how to obtain vaccines.   Qualifies for Shingles Vaccine? Yes   Zostavax completed No   Shingrix Completed?: No.    Education has been provided regarding the importance of this vaccine. Patient has been advised to call insurance  company to determine out of pocket expense if they have not yet received this vaccine. Advised may also receive vaccine at local pharmacy or Health Dept. Verbalized acceptance and understanding.  Screening Tests Health Maintenance  Topic Date Due   Zoster Vaccines- Shingrix (1 of 2) Never done   DTaP/Tdap/Td (3 - Tdap) 06/10/2018   COVID-19 Vaccine (5 - 2023-24 season) 10/14/2022   MAMMOGRAM  05/03/2023   INFLUENZA VACCINE  05/04/2023   Medicare Annual Wellness (AWV)  03/19/2024   Pneumonia Vaccine 52+ Years old  Completed   DEXA SCAN  Completed   Hepatitis C Screening  Completed   HPV VACCINES  Aged Out   Colonoscopy  Discontinued    Health Maintenance  Health Maintenance Due  Topic Date Due   Zoster Vaccines- Shingrix (1 of 2) Never done  DTaP/Tdap/Td (3 - Tdap) 06/10/2018   COVID-19 Vaccine (5 - 2023-24 season) 10/14/2022    Colorectal cancer screening: No longer required.   Mammogram status: Completed 05/02/2022. Repeat every year  Bone Density status: Completed 05/02/22. Results reflect: Bone density results: OSTEOPOROSIS. Repeat every 2 years.  Lung Cancer Screening: (Low Dose CT Chest recommended if Age 60-80 years, 30 pack-year currently smoking OR have quit w/in 15years.) does not qualify.   Lung Cancer Screening Referral:  no  Additional Screening:  Hepatitis C Screening: does qualify; Completed 03/18/14  Vision Screening: Recommended annual ophthalmology exams for early detection of glaucoma and other disorders of the eye. Is the patient up to date with their annual eye exam?  Yes  Who is the provider or what is the name of the office in which the patient attends annual eye exams? [facility] If pt is not established with a provider, would they like to be referred to a provider to establish care? Yes .   Dental Screening: Recommended annual dental exams for proper oral hygiene  Community Resource Referral / Chronic Care Management: CRR required this visit?  No    CCM required this visit?  No      Plan:     I have personally reviewed and noted the following in the patient's chart:   Medical and social history Use of alcohol, tobacco or illicit drugs  Current medications and supplements including opioid prescriptions. Patient is not currently taking opioid prescriptions. Functional ability and status Nutritional status Physical activity Advanced directives List of other physicians Hospitalizations, surgeries, and ER visits in previous 12 months Vitals Screenings to include cognitive, depression, and falls Referrals and appointments  In addition, I have reviewed and discussed with patient certain preventive protocols, quality metrics, and best practice recommendations. A written personalized care plan for preventive services as well as general preventive health recommendations were provided to patient.     Maryan Puls, LPN   1/61/0960   Nurse Notes: order placed for mammogram.

## 2023-05-13 ENCOUNTER — Other Ambulatory Visit: Payer: Self-pay | Admitting: Family Medicine

## 2023-05-13 DIAGNOSIS — K219 Gastro-esophageal reflux disease without esophagitis: Secondary | ICD-10-CM

## 2023-05-15 NOTE — Telephone Encounter (Signed)
E-scribed refill  Plz schedule CPE and fasting lab (no food/drink- except water and/or blk coffee 5 hrs prior) visits for additional refills.  

## 2023-05-16 ENCOUNTER — Ambulatory Visit
Admission: RE | Admit: 2023-05-16 | Discharge: 2023-05-16 | Disposition: A | Discharge status: Home / Self Care | Payer: Medicare Other | Source: Ambulatory Visit | Attending: Family Medicine | Admitting: Family Medicine

## 2023-05-16 DIAGNOSIS — Z1231 Encounter for screening mammogram for malignant neoplasm of breast: Secondary | ICD-10-CM | POA: Diagnosis not present

## 2023-05-16 NOTE — Telephone Encounter (Signed)
Noted  

## 2023-05-16 NOTE — Telephone Encounter (Signed)
LVM for patient to c/b and schedule.  

## 2023-05-16 NOTE — Telephone Encounter (Signed)
Patient scheduled.

## 2023-06-03 ENCOUNTER — Other Ambulatory Visit: Payer: Self-pay | Admitting: Student

## 2023-06-03 DIAGNOSIS — I480 Paroxysmal atrial fibrillation: Secondary | ICD-10-CM

## 2023-06-06 ENCOUNTER — Ambulatory Visit: Payer: Medicare Other | Attending: Student | Admitting: Student

## 2023-06-06 ENCOUNTER — Encounter: Payer: Self-pay | Admitting: Student

## 2023-06-06 VITALS — BP 132/68 | HR 61 | Ht 65.0 in | Wt 121.2 lb

## 2023-06-06 DIAGNOSIS — I480 Paroxysmal atrial fibrillation: Secondary | ICD-10-CM | POA: Diagnosis not present

## 2023-06-06 DIAGNOSIS — I10 Essential (primary) hypertension: Secondary | ICD-10-CM | POA: Diagnosis not present

## 2023-06-06 DIAGNOSIS — D6869 Other thrombophilia: Secondary | ICD-10-CM | POA: Insufficient documentation

## 2023-06-06 NOTE — Progress Notes (Signed)
  Electrophysiology Office Note:   Date:  06/06/2023  ID:  Alexis Bentley, DOB Sep 25, 1946, MRN [id number]  Primary Cardiologist: None Electrophysiologist: Lanier Prude, MD      History of Present Illness:   Alexis Bentley is a 77 y.o. female with h/o PAF and HTN seen today for routine electrophysiology followup.  Since last being seen in our clinic the patient reports doing very well. She has mild lightheadedness with rapid standing, especially from bending over. She has mild SOB with inclines. Otherwise,  she denies chest pain, palpitations, PND, orthopnea, nausea, vomiting, syncope, edema, weight gain, or early satiety.   Review of systems complete and found to be negative unless listed in HPI.   EP Information / Studies Reviewed:    EKG is ordered today. Personal review as below.  EKG Interpretation Date/Time:  Tuesday June 06 2023 09:49:17 EDT Ventricular Rate:  61 PR Interval:  150 QRS Duration:  72 QT Interval:  402 QTC Calculation: 404 R Axis:   74  Text Interpretation: Normal sinus rhythm Normal ECG Confirmed by Maxine Glenn [phone]) on 06/06/2023 9:56:17 AM    Physical Exam:   VS:  BP 132/68   Pulse 61   Ht 5\' 5"  (1.651 m)   Wt 121 lb 3.2 oz (55 kg)   SpO2 97%   BMI 20.17 kg/m    Wt Readings from Last 3 Encounters:  06/06/23 121 lb 3.2 oz (55 kg)  03/20/23 124 lb (56.2 kg)  02/01/23 124 lb 4 oz (56.4 kg)     GEN: Well nourished, well developed in no acute distress NECK: No JVD; No carotid bruits CARDIAC: Regular rate and rhythm, no murmurs, rubs, gallops RESPIRATORY:  Clear to auscultation without rales, wheezing or rhonchi  ABDOMEN: Soft, non-tender, non-distended EXTREMITIES:  No edema; No deformity   ASSESSMENT AND PLAN:    Paroxysmal atrial fibrillation EKG today shows NSR Continue Eliquis for CHA2DS2VASc  of at least 3 Recent Labs 02/2023 stable  HTN Stable on current regimen   Secondary hypercoagulable state Pt on Eliquis as above       Follow up with Dr. Lalla Brothers in 12 months  Signed, Graciella Freer, PA-C

## 2023-06-06 NOTE — Patient Instructions (Signed)

## 2023-06-14 ENCOUNTER — Other Ambulatory Visit: Payer: Self-pay | Admitting: Student

## 2023-06-14 DIAGNOSIS — I480 Paroxysmal atrial fibrillation: Secondary | ICD-10-CM

## 2023-07-07 DIAGNOSIS — Z23 Encounter for immunization: Secondary | ICD-10-CM | POA: Diagnosis not present

## 2023-07-17 ENCOUNTER — Telehealth: Payer: Self-pay | Admitting: Cardiology

## 2023-07-17 NOTE — Telephone Encounter (Signed)
*  STAT* If patient is at the pharmacy, call can be transferred to refill team.   1. Which medications need to be refilled? (please list name of each medication and dose if known)   apixaban (ELIQUIS) 5 MG TABS tablet    2. Which pharmacy/location (including street and city if local pharmacy) is medication to be sent to? [id number] - [place], [place] - [address]   3. Do they need a 30 day or 90 day supply? 90

## 2023-07-18 ENCOUNTER — Other Ambulatory Visit: Payer: Self-pay

## 2023-07-18 DIAGNOSIS — I480 Paroxysmal atrial fibrillation: Secondary | ICD-10-CM

## 2023-07-18 MED ORDER — APIXABAN 5 MG PO TABS: 5.0000 mg | ORAL_TABLET | Freq: Two times a day (BID) | ORAL | 5 refills | Status: DC

## 2023-07-18 NOTE — Telephone Encounter (Signed)
Prescription refill request for Eliquis received. Indication:Afib  Last office visit: 06/06/23 Lanna Poche)  Scr: 0.96 (02/01/23)  Age: 77 Weight: 55kg  Appropriate dose. Refill sent.

## 2023-08-11 ENCOUNTER — Other Ambulatory Visit: Payer: Self-pay | Admitting: Family Medicine

## 2023-08-11 DIAGNOSIS — K219 Gastro-esophageal reflux disease without esophagitis: Secondary | ICD-10-CM

## 2023-08-11 NOTE — Telephone Encounter (Signed)
Patient is already scheduled for her annual physical in December. Does she still need a follow up before then?

## 2023-08-11 NOTE — Telephone Encounter (Signed)
No, she can wait until that visit.

## 2023-08-11 NOTE — Telephone Encounter (Signed)
Pt needs routine follow-up with Dr Sharen Hones.

## 2023-09-07 ENCOUNTER — Other Ambulatory Visit: Payer: Self-pay | Admitting: Family Medicine

## 2023-09-07 DIAGNOSIS — E785 Hyperlipidemia, unspecified: Secondary | ICD-10-CM

## 2023-09-07 DIAGNOSIS — M810 Age-related osteoporosis without current pathological fracture: Secondary | ICD-10-CM

## 2023-09-07 DIAGNOSIS — E538 Deficiency of other specified B group vitamins: Secondary | ICD-10-CM

## 2023-09-07 DIAGNOSIS — I4891 Unspecified atrial fibrillation: Secondary | ICD-10-CM

## 2023-09-11 ENCOUNTER — Other Ambulatory Visit: Payer: Medicare Other

## 2023-09-18 ENCOUNTER — Ambulatory Visit (INDEPENDENT_AMBULATORY_CARE_PROVIDER_SITE_OTHER): Payer: Medicare Other | Admitting: Family Medicine

## 2023-09-18 ENCOUNTER — Encounter: Payer: Self-pay | Admitting: Family Medicine

## 2023-09-18 VITALS — BP 158/70 | HR 59 | Temp 97.8°F | Ht 63.5 in | Wt 122.4 lb

## 2023-09-18 DIAGNOSIS — E785 Hyperlipidemia, unspecified: Secondary | ICD-10-CM | POA: Diagnosis not present

## 2023-09-18 DIAGNOSIS — K219 Gastro-esophageal reflux disease without esophagitis: Secondary | ICD-10-CM | POA: Diagnosis not present

## 2023-09-18 DIAGNOSIS — M810 Age-related osteoporosis without current pathological fracture: Secondary | ICD-10-CM

## 2023-09-18 DIAGNOSIS — E538 Deficiency of other specified B group vitamins: Secondary | ICD-10-CM | POA: Diagnosis not present

## 2023-09-18 DIAGNOSIS — M7918 Myalgia, other site: Secondary | ICD-10-CM | POA: Insufficient documentation

## 2023-09-18 DIAGNOSIS — Z7189 Other specified counseling: Secondary | ICD-10-CM | POA: Diagnosis not present

## 2023-09-18 DIAGNOSIS — N1831 Chronic kidney disease, stage 3a: Secondary | ICD-10-CM | POA: Diagnosis not present

## 2023-09-18 DIAGNOSIS — E739 Lactose intolerance, unspecified: Secondary | ICD-10-CM

## 2023-09-18 DIAGNOSIS — N183 Chronic kidney disease, stage 3 unspecified: Secondary | ICD-10-CM | POA: Insufficient documentation

## 2023-09-18 DIAGNOSIS — I4891 Unspecified atrial fibrillation: Secondary | ICD-10-CM

## 2023-09-18 DIAGNOSIS — I10 Essential (primary) hypertension: Secondary | ICD-10-CM | POA: Diagnosis not present

## 2023-09-18 MED ORDER — OMEPRAZOLE 40 MG PO CPDR: 40.0000 mg | DELAYED_RELEASE_CAPSULE | Freq: Every day | ORAL | 4 refills | Status: DC

## 2023-09-18 NOTE — Assessment & Plan Note (Addendum)
Reviewed Cr trend over the past 4 years- GFR 50s.  Discussed CKD stage 3, encouraged good hydration status, good BP control, avoid NSAIDs, try to limit PPI as feasible.  Anticipate hypertensive nephropathy - will continue to monitor.

## 2023-09-18 NOTE — Assessment & Plan Note (Addendum)
Continue oral vit B12 MWF dosing.

## 2023-09-18 NOTE — Patient Instructions (Addendum)
Try alternating omeprazole with pepcid - to help kidney function.  If interested, check with pharmacy about new 2 shot shingles series (shingrix).  For R hip pain - try exercises provided today  BP was staying too high - continue monitoring at home and send me some readings to review (BP log sheet provided today).  Good to see you today Return as needed or in 1 year for next physical

## 2023-09-18 NOTE — Assessment & Plan Note (Addendum)
Chronic issue managed with daily PPI, with breakthrough symptoms if missed dose. Discussed PPI and CKD - encourage try tapering as able. Rec try every other day dosing to start. Will start pepcid 20mg  to use PRN, may use every other day alternating days. Update with effect.  H/o HH, PUD, erosive gastritis in the past.

## 2023-09-18 NOTE — Assessment & Plan Note (Signed)
H/o this followed by EP on eliquis and metoprolol

## 2023-09-18 NOTE — Assessment & Plan Note (Signed)
Chronic, overall stable readings off medication.  The 10-year ASCVD risk score (Arnett DK, et al., 2019) is: 30.5%   Values used to calculate the score:     Age: 77 years     Sex: Female     Is Non-Hispanic African American: No     Diabetic: No     Tobacco smoker: No     Systolic Blood Pressure: 158 mmHg     Is BP treated: Yes     HDL Cholesterol: 74 mg/dL     Total Cholesterol: 200 mg/dL

## 2023-09-18 NOTE — Assessment & Plan Note (Signed)
Advanced directive: completed at home, asked to bring a copy. Would want husband then youngest son (Keith Bottoms) to be HCPOA.  

## 2023-09-18 NOTE — Progress Notes (Signed)
Ph: [phone] Fax: [phone]   Patient ID: Alexis Bentley, female    DOB: 29-Feb-1946, 77 y.o.   MRN: [id number]  This visit was conducted in person.  BP (!) 158/70   Pulse (!) 59   Temp 97.8 F (36.6 C) (Oral)   Ht 5' 3.5" (1.613 m)   Wt 122 lb 6 oz (55.5 kg)   SpO2 99%   BMI 21.34 kg/m   164/70 on retesting   CC: AMW f/u visit  Subjective:   HPI: Alexis Bentley is a 77 y.o. female presenting on 09/18/2023 for Annual Exam [facility] prt 2 [AWV- 03/20/23]. )   Saw health advisor 03/2023 for medicare wellness visit. Note reviewed.   No results found.  Flowsheet Row Clinical Support from 03/20/2023 in [facility] [place]  PHQ-2 Total Score 0          03/20/2023    9:54 AM 03/19/2023   12:37 PM 02/01/2023   11:43 AM 03/15/2022   12:06 PM 11/15/2019    2:50 PM  Fall Risk   Falls in the past year? 0 0  0 0 0  Number falls in past yr: 0   0 0  Injury with Fall? 0   0 0  Risk for fall due to : No Fall Risks   No Fall Risks No Fall Risks  Follow up Falls prevention discussed;Falls evaluation completed   Falls prevention discussed Falls evaluation completed;Falls prevention discussed     Patient-reported    She notes fatigue symptoms are improved.   HTN - continues lisinopril 20mg  daily. Home BP readings run 130s/70-80.   GERD - on daily chronic PPI since 2013. S/p EGD 2018. Breakthrough symptoms if dose missed. Lactose intolerance - now using lactaid milk.   Notes some R lateral hip pain for about a month.    Preventative: COLONOSCOPY 01/2018 - WNL, no follow up recommended [facility]) Well woman exam - with OBGYN last with Dr. Haskel Khan 08/2015 [facility]). He retired. S/p complete hysterectomy, ovaries removed as well. S/p rectal prolapse surgery 2005.  Mammo 05/2023 - Birads1 @ [place] DEXA T score -2.8 R femur (12/2017).  DEXA 04/2022 T -3.0 R femur, -3.6 L forearm.   Reviewed cal/vit D intake, encouraged regular walking. Limited milk, does  eat cheese. Not interested in bone strengthening medicine.  Lung cancer screen - not eligible  Flu shot yearly  Pfizer covid vaccine 11/2019, 12/2019, booster 09/2020 Prevnar-13 2016, pneumovax 2017  Td 2009 Shingrix - discussed. To consider.  Advanced directive: completed at home, asked to bring a copy. Would want husband then youngest son Alexis Bentley) to be HCPOA.  Seat belt use discussed Sunscreen use discussed. No changing moles on skin. Sees [place] derm yearly. Non smoker Alcohol - none Dentist q6 mo  Eye exam yearly  Bowel - no constipation Bladder - occasional leaks, wears liner - urge > stress incontinence, not currently bothersome   Married Lives with husband.  2 grown children Daily caffeine use - 3 cups/day in cold weather Retired 2013 from [employer]  Activity: walking as well as water therapy with husband  Diet: good water intake, fruits/vegetables daily       Relevant past medical, surgical, family and social history reviewed and updated as indicated. Interim medical history since our last visit reviewed. Allergies and medications reviewed and updated. Outpatient Medications Prior to Visit  Medication Sig Dispense Refill   apixaban (ELIQUIS) 5 MG TABS tablet Take 1 tablet (  5 mg total) by mouth 2 (two) times daily. 60 tablet 5   Calcium Carbonate-Vitamin D 600-400 MG-UNIT tablet Take 1 tablet by mouth 2 (two) times daily.     famotidine (PEPCID) 20 MG tablet Take 1 tablet (20 mg total) by mouth at bedtime as needed for heartburn or indigestion.     lisinopril (ZESTRIL) 20 MG tablet TAKE 1 TABLET BY MOUTH EVERY DAY 90 tablet 3   metoprolol tartrate (LOPRESSOR) 25 MG tablet TAKE 1 TABLET BY MOUTH TWICE A DAY 180 tablet 3   vitamin B-12 (CYANOCOBALAMIN) 500 MCG tablet Take 1 tablet (500 mcg total) by mouth every Monday, Wednesday, and Friday.     omeprazole (PRILOSEC) 40 MG capsule TAKE 1 CAPSULE (40 MG TOTAL) BY MOUTH DAILY. 90 capsule 0   No  facility-administered medications prior to visit.     Per HPI unless specifically indicated in ROS section below Review of Systems  Constitutional:  Negative for activity change, appetite change, chills, fatigue, fever and unexpected weight change.  HENT:  Negative for hearing loss.   Eyes:  Negative for visual disturbance.  Respiratory:  Negative for cough, chest tightness, shortness of breath and wheezing.   Cardiovascular:  Negative for chest pain, palpitations and leg swelling.  Gastrointestinal:  Negative for abdominal distention, abdominal pain, blood in stool, constipation, diarrhea, nausea and vomiting.  Genitourinary:  Negative for difficulty urinating and hematuria.  Musculoskeletal:  Negative for arthralgias, myalgias and neck pain.  Skin:  Negative for rash.  Neurological:  Negative for dizziness, seizures, syncope and headaches.  Hematological:  Negative for adenopathy. Does not bruise/bleed easily.  Psychiatric/Behavioral:  Negative for dysphoric mood. The patient is not nervous/anxious.     Objective:  BP (!) 158/70   Pulse (!) 59   Temp 97.8 F (36.6 C) (Oral)   Ht 5' 3.5" (1.613 m)   Wt 122 lb 6 oz (55.5 kg)   SpO2 99%   BMI 21.34 kg/m   Wt Readings from Last 3 Encounters:  09/18/23 122 lb 6 oz (55.5 kg)  06/06/23 121 lb 3.2 oz (55 kg)  03/20/23 124 lb (56.2 kg)      Physical Exam Vitals and nursing note reviewed.  Constitutional:      Appearance: Normal appearance. She is not ill-appearing.  HENT:     Head: Normocephalic and atraumatic.     Right Ear: Tympanic membrane, ear canal and external ear normal. There is no impacted cerumen.     Left Ear: Tympanic membrane, ear canal and external ear normal. There is no impacted cerumen.     Mouth/Throat:     Mouth: Mucous membranes are moist.     Pharynx: Oropharynx is clear. No oropharyngeal exudate or posterior oropharyngeal erythema.  Eyes:     General:        Right eye: No discharge.        Left eye: No  discharge.     Extraocular Movements: Extraocular movements intact.     Conjunctiva/sclera: Conjunctivae normal.     Pupils: Pupils are equal, round, and reactive to light.  Neck:     Thyroid: No thyroid mass or thyromegaly.     Vascular: No carotid bruit.  Cardiovascular:     Rate and Rhythm: Normal rate and regular rhythm.     Pulses: Normal pulses.     Heart sounds: Normal heart sounds. No murmur heard. Pulmonary:     Effort: Pulmonary effort is normal. No respiratory distress.     Breath sounds:  Normal breath sounds. No wheezing, rhonchi or rales.  Abdominal:     General: Bowel sounds are normal. There is no distension.     Palpations: Abdomen is soft. There is no mass.     Tenderness: There is no abdominal tenderness. There is no guarding or rebound.     Hernia: No hernia is present.  Musculoskeletal:     Cervical back: Normal range of motion and neck supple. No rigidity.     Right lower leg: No edema.     Left lower leg: No edema.     Comments:  Discomfort with seated SLR on right. No pain with int/ext rotation at hip. No significant pain at SIJ, GTB bilaterally.   Lymphadenopathy:     Cervical: No cervical adenopathy.  Skin:    General: Skin is warm and dry.     Findings: No rash.  Neurological:     General: No focal deficit present.     Mental Status: She is alert. Mental status is at baseline.  Psychiatric:        Mood and Affect: Mood normal.        Behavior: Behavior normal.       Results for orders placed or performed in visit on 02/01/23  Lipid panel   Collection Time: 02/01/23 12:10 PM  Result Value Ref Range   Cholesterol 200 0 - 200 mg/dL   Triglycerides 782.9 0.0 - 149.0 mg/dL   HDL 56.21 >30.86 mg/dL   VLDL 57.8 0.0 - 46.9 mg/dL   LDL Cholesterol 99 0 - 99 mg/dL   Total CHOL/HDL Ratio 3    NonHDL 125.51   Comprehensive metabolic panel   Collection Time: 02/01/23 12:10 PM  Result Value Ref Range   Sodium 137 135 - 145 mEq/L   Potassium 4.7 3.5  - 5.1 mEq/L   Chloride 101 96 - 112 mEq/L   CO2 27 19 - 32 mEq/L   Glucose, Bld 85 70 - 99 mg/dL   BUN 23 6 - 23 mg/dL   Creatinine, Ser 6.29 0.40 - 1.20 mg/dL   Total Bilirubin 0.5 0.2 - 1.2 mg/dL   Alkaline Phosphatase 100 39 - 117 U/L   AST 21 0 - 37 U/L   ALT 14 0 - 35 U/L   Total Protein 7.5 6.0 - 8.3 g/dL   Albumin 4.4 3.5 - 5.2 g/dL   GFR 52.84 (L) >13.24 mL/min   Calcium 10.1 8.4 - 10.5 mg/dL  TSH   Collection Time: 02/01/23 12:10 PM  Result Value Ref Range   TSH 1.33 0.35 - 5.50 uIU/mL  CBC with Differential/Platelet   Collection Time: 02/01/23 12:10 PM  Result Value Ref Range   WBC 7.4 4.0 - 10.5 K/uL   RBC 4.08 3.87 - 5.11 Mil/uL   Hemoglobin 12.6 12.0 - 15.0 g/dL   HCT 40.1 02.7 - 25.3 %   MCV 92.5 78.0 - 100.0 fl   MCHC 33.5 30.0 - 36.0 g/dL   RDW 66.4 40.3 - 47.4 %   Platelets 254.0 150.0 - 400.0 K/uL   Neutrophils Relative % 57.0 43.0 - 77.0 %   Lymphocytes Relative 32.5 12.0 - 46.0 %   Monocytes Relative 6.8 3.0 - 12.0 %   Eosinophils Relative 2.4 0.0 - 5.0 %   Basophils Relative 1.3 0.0 - 3.0 %   Neutro Abs 4.2 1.4 - 7.7 K/uL   Lymphs Abs 2.4 0.7 - 4.0 K/uL   Monocytes Absolute 0.5 0.1 - 1.0 K/uL   Eosinophils  Absolute 0.2 0.0 - 0.7 K/uL   Basophils Absolute 0.1 0.0 - 0.1 K/uL  Vitamin B12   Collection Time: 02/01/23 12:10 PM  Result Value Ref Range   Vitamin B-12 724 211 - 911 pg/mL  VITAMIN D 25 Hydroxy (Vit-D Deficiency, Fractures)   Collection Time: 02/01/23 12:10 PM  Result Value Ref Range   VITD 51.24 30.00 - 100.00 ng/mL    Assessment & Plan:   Problem List Items Addressed This Visit     Advanced directives, counseling/discussion - Primary (Chronic)   Advanced directive: completed at home, asked to bring a copy. Would want husband then youngest son Alexis Bentley) to be HCPOA.       Essential hypertension   Chronic, BP above goal despite 2 drug regimen. She notes home readings largely well controlled. I asked her to start monitoring  BP at home (log sheet provided) and drop off readings to review, further titrate based on results      GERD   Chronic issue managed with daily PPI, with breakthrough symptoms if missed dose. Discussed PPI and CKD - encourage try tapering as able. Rec try every other day dosing to start. Will start pepcid 20mg  to use PRN, may use every other day alternating days. Update with effect.  H/o HH, PUD, erosive gastritis in the past.       Relevant Medications   omeprazole (PRILOSEC) 40 MG capsule   Osteoporosis   Discussed calcium and vitamin D dosing.  Encouraged regular weight bearing exercise.       HLD (hyperlipidemia)   Chronic, overall stable readings off medication.  The 10-year ASCVD risk score (Arnett DK, et al., 2019) is: 30.5%   Values used to calculate the score:     Age: 52 years     Sex: Female     Is Non-Hispanic African American: No     Diabetic: No     Tobacco smoker: No     Systolic Blood Pressure: 158 mmHg     Is BP treated: Yes     HDL Cholesterol: 74 mg/dL     Total Cholesterol: 200 mg/dL       Low serum vitamin B12   Continue oral vit B12 MWF dosing.       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  Update if not improving with this.       CKD (chronic kidney disease) stage 3, GFR 30-59 ml/min (HCC)   Reviewed Cr trend over the past 4 years- GFR 50s.  Discussed CKD stage 3, encouraged good hydration status, good BP control, avoid NSAIDs, try to limit PPI as feasible.  Anticipate hypertensive nephropathy - will continue to monitor.         Meds ordered this encounter  Medications   omeprazole (PRILOSEC) 40 MG capsule    Sig: Take 1 capsule (40 mg total) by mouth daily.    Dispense:  90 capsule    Refill:  4    No orders of the defined types were placed in this  encounter.   Patient Instructions  Try alternating omeprazole with pepcid - to help kidney function.  If interested, check with pharmacy about new 2 shot shingles series (shingrix).  For R hip pain - try exercises provided today  BP was staying too high - continue monitoring  at home and send me some readings to review (BP log sheet provided today).  Good to see you today Return as needed or in 1 year for next physical   Follow up plan: Return in about 1 year (around 09/17/2024) for annual exam, prior fasting for blood work.  Eustaquio Boyden, MD

## 2023-09-18 NOTE — Assessment & Plan Note (Addendum)
Discussed calcium and vitamin D dosing.  Encouraged regular weight bearing exercise.

## 2023-09-18 NOTE — Assessment & Plan Note (Addendum)
Exam suspicious for sciatica - provided with piriformis syndrome exercises.  Update if not improving with this.

## 2023-09-18 NOTE — Assessment & Plan Note (Signed)
Chronic, BP above goal despite 2 drug regimen. She notes home readings largely well controlled. I asked her to start monitoring BP at home (log sheet provided) and drop off readings to review, further titrate based on results

## 2023-10-19 ENCOUNTER — Ambulatory Visit (INDEPENDENT_AMBULATORY_CARE_PROVIDER_SITE_OTHER): Payer: Medicare Other | Admitting: Family Medicine

## 2023-10-19 ENCOUNTER — Encounter: Payer: Self-pay | Admitting: Family Medicine

## 2023-10-19 VITALS — BP 150/70 | HR 72 | Temp 98.1°F | Ht 63.5 in | Wt 123.1 lb

## 2023-10-19 DIAGNOSIS — I10 Essential (primary) hypertension: Secondary | ICD-10-CM

## 2023-10-19 DIAGNOSIS — R051 Acute cough: Secondary | ICD-10-CM | POA: Diagnosis not present

## 2023-10-19 LAB — POC INFLUENZA A&B (BINAX/QUICKVUE)
Influenza A, POC: NEGATIVE
Influenza B, POC: NEGATIVE

## 2023-10-19 LAB — POC COVID19 BINAXNOW: SARS Coronavirus 2 Ag: NEGATIVE

## 2023-10-19 MED ORDER — GUAIFENESIN-CODEINE 100-10 MG/5ML PO SOLN: 5.0000 mL | Freq: Every day | ORAL | 0 refills | Status: DC

## 2023-10-19 MED ORDER — PREDNISONE 20 MG PO TABS: ORAL_TABLET | ORAL | 0 refills | Status: DC

## 2023-10-19 MED ORDER — AZITHROMYCIN 250 MG PO TABS: ORAL_TABLET | ORAL | 0 refills | Status: DC

## 2023-10-19 NOTE — Assessment & Plan Note (Signed)
Chronic.Marland Kitchen elevated BP noted in office today. Pt will avoid decongestants and follow  at home.. if remaining elevated, follow up with PCP.

## 2023-10-19 NOTE — Progress Notes (Signed)
Patient ID: Alexis Bentley, female    DOB: [birth year], 78 y.o.   MRN: [id number]  This visit was conducted in person.  BP (!) 150/70 (BP Location: Left Arm, Patient Position: Sitting, Cuff Size: Normal)   Pulse 72   Temp 98.1 F (36.7 C) (Temporal)   Ht 5' 3.5" (1.613 m)   Wt 123 lb 2 oz (55.8 kg)   SpO2 98%   BMI 21.47 kg/m    CC:  Chief Complaint  Patient presents with   Cough    Started on Monday No Covid Test   Facial Pain    Across above eyes and cheek bones    Subjective:   HPI: Alexis Bentley is a 78 y.o. female patient of Dr. Sharen Hones patient of Dr. Sharen Hones with history of hypertension, chronic kidney disease presenting on 10/19/2023 for   cough  Date of onset: 4 days ago most significant symptoms, 2 days prior did start feeling mildly sick, fatigue Initial symptoms included   cough, productive of green mucus. Symptoms progressed to face pain above eyes and on cheek bones  Headache. Bilateral ear fullness.  No  fever, no chills.  No body aches.  No SOB, occ wheeze and looses breath with  coughing fits.  Cough keeping her up at night.  Sick contacts: none COVID testing:   none    She has tried to treat with  robitussin cough, tylneol.    No history of chronic lung disease such as asthma or COPD. Non-smoker.       Relevant past medical, surgical, family and social history reviewed and updated as indicated. Interim medical history since our last visit reviewed. Allergies and medications reviewed and updated. Outpatient Medications Prior to Visit  Medication Sig Dispense Refill   apixaban (ELIQUIS) 5 MG TABS tablet Take 1 tablet (5 mg total) by mouth 2 (two) times daily. 60 tablet 5   Calcium Carbonate-Vitamin D 600-400 MG-UNIT tablet Take 1 tablet by mouth 2 (two) times daily.     famotidine (PEPCID) 20 MG tablet Take 1 tablet (20 mg total) by mouth at bedtime as needed for heartburn or indigestion.     lisinopril (ZESTRIL) 20 MG tablet TAKE 1 TABLET  BY MOUTH EVERY DAY 90 tablet 3   metoprolol tartrate (LOPRESSOR) 25 MG tablet TAKE 1 TABLET BY MOUTH TWICE A DAY 180 tablet 3   omeprazole (PRILOSEC) 40 MG capsule Take 1 capsule (40 mg total) by mouth daily. 90 capsule 4   vitamin B-12 (CYANOCOBALAMIN) 500 MCG tablet Take 1 tablet (500 mcg total) by mouth every Monday, Wednesday, and Friday.     No facility-administered medications prior to visit.     Per HPI unless specifically indicated in ROS section below Review of Systems  Constitutional:  Positive for fatigue. Negative for fever.  HENT:  Positive for congestion, sinus pressure and sinus pain.   Eyes:  Negative for pain.  Respiratory:  Positive for cough and wheezing. Negative for shortness of breath.   Cardiovascular:  Negative for chest pain, palpitations and leg swelling.  Gastrointestinal:  Negative for abdominal pain.  Genitourinary:  Negative for dysuria and vaginal bleeding.  Musculoskeletal:  Negative for back pain.  Neurological:  Negative for syncope, light-headedness and headaches.  Psychiatric/Behavioral:  Negative for dysphoric mood.    Objective:  BP (!) 150/70 (BP Location: Left Arm, Patient Position: Sitting, Cuff Size: Normal)   Pulse 72   Temp 98.1 F (36.7 C) (Temporal)   Ht 5' 3.5" (  1.613 m)   Wt 123 lb 2 oz (55.8 kg)   SpO2 98%   BMI 21.47 kg/m   Wt Readings from Last 3 Encounters:  10/19/23 123 lb 2 oz (55.8 kg)  09/18/23 122 lb 6 oz (55.5 kg)  06/06/23 121 lb 3.2 oz (55 kg)      Physical Exam Constitutional:      General: She is not in acute distress.    Appearance: Normal appearance. She is well-developed. She is not ill-appearing or toxic-appearing.  HENT:     Head: Normocephalic.     Right Ear: Hearing, ear canal and external ear normal. A middle ear effusion is present. Tympanic membrane is not erythematous, retracted or bulging.     Left Ear: Hearing, ear canal and external ear normal. A middle ear effusion is present. Tympanic membrane is  not erythematous, retracted or bulging.     Nose: No mucosal edema or rhinorrhea.     Right Sinus: Maxillary sinus tenderness present. No frontal sinus tenderness.     Left Sinus: Maxillary sinus tenderness present. No frontal sinus tenderness.     Mouth/Throat:     Pharynx: Uvula midline.  Eyes:     General: Lids are normal. Lids are everted, no foreign bodies appreciated.     Conjunctiva/sclera: Conjunctivae normal.     Pupils: Pupils are equal, round, and reactive to light.  Neck:     Thyroid: No thyroid mass or thyromegaly.     Vascular: No carotid bruit.     Trachea: Trachea normal.  Cardiovascular:     Rate and Rhythm: Normal rate and regular rhythm.     Pulses: Normal pulses.     Heart sounds: Normal heart sounds, S1 normal and S2 normal. No murmur heard.    No friction rub. No gallop.  Pulmonary:     Effort: Pulmonary effort is normal. No tachypnea or respiratory distress.     Breath sounds: Wheezing and rhonchi present. No decreased breath sounds or rales.     Comments:  Scatter whee/occ rhonci, non focal Abdominal:     General: Bowel sounds are normal.     Palpations: Abdomen is soft.     Tenderness: There is no abdominal tenderness.  Musculoskeletal:     Cervical back: Normal range of motion and neck supple.  Skin:    General: Skin is warm and dry.     Findings: No rash.  Neurological:     Mental Status: She is alert.  Psychiatric:        Mood and Affect: Mood is not anxious or depressed.        Speech: Speech normal.        Behavior: Behavior normal. Behavior is cooperative.        Thought Content: Thought content normal.        Judgment: Judgment normal.       Results for orders placed or performed in visit on 10/19/23  POC COVID-19   Collection Time: 10/19/23 10:46 AM  Result Value Ref Range   SARS Coronavirus 2 Ag Negative Negative  POC Influenza A&B (Binax test)   Collection Time: 10/19/23 10:46 AM  Result Value Ref Range   Influenza A, POC Negative  Negative   Influenza B, POC Negative Negative    Assessment and Plan  Acute cough Assessment & Plan: Acute, symptoms appear to possibly be going on closer to 6 to 7 days of illness.  COVID and flu testing negative in office today.  Concern for  viral infection now with bacterial superinfection. Will treat with azithromycin 5-day course.  Some evidence of bronchitis so will treat with prednisone taper both for wheeze as well for sinus pressure. Cough suppressant at bedtime to help with rest. Push fluids and symptomatic care.  Return and ER precautions provided.  Orders: -     POC COVID-19 BinaxNow -     POC Influenza A&B(BINAX/QUICKVUE)  Essential hypertension Assessment & Plan: Chronic.Marland Kitchen elevated BP noted in office today. Pt will avoid decongestants and follow  at home.. if remaining elevated, follow up with PCP.   Other orders -     predniSONE; 3 tabs by mouth daily x 3 days, then 2 tabs by mouth daily x 2 days then 1 tab by mouth daily x 2 days  Dispense: 15 tablet; Refill: 0 -     guaiFENesin-Codeine; Take 5-10 mLs by mouth at bedtime.  Dispense: 100 mL; Refill: 0 -     Azithromycin; 2 tab po x 1 day then 1 tab po daily  Dispense: 6 tablet; Refill: 0    No follow-ups on file.   Kerby Nora, MD

## 2023-10-19 NOTE — Assessment & Plan Note (Signed)
Acute, symptoms appear to possibly be going on closer to 6 to 7 days of illness.  COVID and flu testing negative in office today.  Concern for viral infection now with bacterial superinfection. Will treat with azithromycin 5-day course.  Some evidence of bronchitis so will treat with prednisone taper both for wheeze as well for sinus pressure. Cough suppressant at bedtime to help with rest. Push fluids and symptomatic care.  Return and ER precautions provided.

## 2023-10-19 NOTE — Patient Instructions (Signed)
Elevated BP noted in office today. Pt will avoid decongestants and follow  at home.. if remaining elevated, follow up with PCP.

## 2023-10-30 DIAGNOSIS — H04123 Dry eye syndrome of bilateral lacrimal glands: Secondary | ICD-10-CM | POA: Diagnosis not present

## 2023-10-30 DIAGNOSIS — D3132 Benign neoplasm of left choroid: Secondary | ICD-10-CM | POA: Diagnosis not present

## 2023-10-30 DIAGNOSIS — Z961 Presence of intraocular lens: Secondary | ICD-10-CM | POA: Diagnosis not present

## 2023-10-30 DIAGNOSIS — H31021 Solar retinopathy, right eye: Secondary | ICD-10-CM | POA: Diagnosis not present

## 2024-01-11 ENCOUNTER — Other Ambulatory Visit: Payer: Self-pay | Admitting: Cardiology

## 2024-01-11 DIAGNOSIS — I480 Paroxysmal atrial fibrillation: Secondary | ICD-10-CM

## 2024-01-11 NOTE — Telephone Encounter (Signed)
 Prescription refill request for Eliquis received. Indication: aib  Last office visit: [place] 06/06/2023 Scr: 0.96, 02/01/2023 Age: 78 yo  Weight: 55.8 kg   Refill sent.

## 2024-03-20 ENCOUNTER — Ambulatory Visit (INDEPENDENT_AMBULATORY_CARE_PROVIDER_SITE_OTHER): Payer: Medicare Other

## 2024-03-20 VITALS — Ht 63.5 in | Wt 123.0 lb

## 2024-03-20 DIAGNOSIS — Z0000 Encounter for general adult medical examination without abnormal findings: Secondary | ICD-10-CM | POA: Diagnosis not present

## 2024-03-20 NOTE — Progress Notes (Signed)
 Subjective:   Alexis Bentley is a 78 y.o. who presents for a Medicare Wellness preventive visit.  As a reminder, Annual Wellness Visits don't include a physical exam, and some assessments may be limited, especially if this visit is performed virtually. We may recommend an in-person follow-up visit with your provider if needed.  Visit Complete: Virtual I connected with  Alexis Bentley on 03/20/24 by a audio enabled telemedicine application and verified that I am speaking with the correct person using two identifiers.  Patient Location: Home  Provider Location: Office/Clinic  I discussed the limitations of evaluation and management by telemedicine. The patient expressed understanding and agreed to proceed.  Vital Signs: Because this visit was a virtual/telehealth visit, some criteria may be missing or patient reported. Any vitals not documented were not able to be obtained and vitals that have been documented are patient reported.  VideoDeclined- This patient declined Librarian, academic. Therefore the visit was completed with audio only.  Persons Participating in Visit: Patient.  AWV Questionnaire: No: Patient Medicare AWV questionnaire was not completed prior to this visit.  Cardiac Risk Factors include: advanced age (>66men, >23 women);dyslipidemia;hypertension     Objective:    Today's Vitals   03/20/24 0933  Weight: 123 lb (55.8 kg)  Height: 5' 3.5 (1.613 m)   Body mass index is 21.45 kg/m.     03/20/2024    9:45 AM 03/20/2023    9:53 AM 03/15/2022   12:06 PM 11/15/2019    2:49 PM 11/13/2018   10:54 AM 11/08/2017   11:56 AM 07/08/2016   11:45 AM  Advanced Directives  Does Patient Have a Medical Advance Directive? Yes Yes Yes Yes No  No  No   Type of Estate agent of [place];Living will Healthcare Power of [place];Living will Healthcare Power of [place];Living will Living will;Healthcare Power of Attorney     Copy of  Healthcare Power of Attorney in Chart? No - copy requested No - copy requested No - copy requested No - copy requested     Would patient like information on creating a medical advance directive?     No - Patient declined  No - Patient declined  Yes - Educational materials given      Data saved with a previous flowsheet row definition    Current Medications (verified) Outpatient Encounter Medications as of 03/20/2024  Medication Sig   Calcium Carbonate-Vitamin D  600-400 MG-UNIT tablet Take 1 tablet by mouth 2 (two) times daily.   ELIQUIS  5 MG TABS tablet TAKE 1 TABLET BY MOUTH TWICE A DAY   famotidine  (PEPCID ) 20 MG tablet Take 1 tablet (20 mg total) by mouth at bedtime as needed for heartburn or indigestion.   lisinopril  (ZESTRIL ) 20 MG tablet TAKE 1 TABLET BY MOUTH EVERY DAY   metoprolol  tartrate (LOPRESSOR ) 25 MG tablet TAKE 1 TABLET BY MOUTH TWICE A DAY   omeprazole  (PRILOSEC) 40 MG capsule Take 1 capsule (40 mg total) by mouth daily.   vitamin B-12 (CYANOCOBALAMIN ) 500 MCG tablet Take 1 tablet (500 mcg total) by mouth every Monday, Wednesday, and Friday.   azithromycin  (ZITHROMAX ) 250 MG tablet 2 tab po x 1 day then 1 tab po daily (Patient not taking: Reported on 03/20/2024)   guaiFENesin -codeine  100-10 MG/5ML syrup Take 5-10 mLs by mouth at bedtime. (Patient not taking: Reported on 03/20/2024)   predniSONE  (DELTASONE ) 20 MG tablet 3 tabs by mouth daily x 3 days, then 2 tabs by mouth daily x  2 days then 1 tab by mouth daily x 2 days (Patient not taking: Reported on 03/20/2024)   No facility-administered encounter medications on file as of 03/20/2024.    Allergies (verified) Patient has no known allergies.   History: Past Medical History:  Diagnosis Date   Chest pain    COVID-19 07/11/2022   Erosive gastritis    Gallbladder polyp 10/2016   by US  4.46mm   GERD (gastroesophageal reflux disease)    Hemorrhoids    Hiatal hernia    Hypertension    Lumbar back pain    With radiculopathy    Osteoporosis 01/07/2015   T-2.5 hip   Paroxysmal atrial fibrillation (HCC) 2010   one episode   Peptic ulcer disease    PVD (posterior vitreous detachment), bilateral 10/13/2016   Followed by ophtho Dr Reyne Cave @ [facility]   Rectal prolapse    Syncope 03/2015   stable head CT, echo, carotids, labwork, EKG   Transaminitis 2015   normal iron, viral hep panel, stable abd us  (benign gallbladder polyp)   Past Surgical History:  Procedure Laterality Date   cardiac catherization  08/07/2009   NML   CATARACT EXTRACTION Bilateral 2010   COLONOSCOPY  12/03/2006   Internal/ external hemorrhoids, no polyps   COLONOSCOPY  01/2018   WNL, no rpt recommended Willy Harvest)   DEXA  03/2015   T -2.5 hip   ESOPHAGOGASTRODUODENOSCOPY  04/27/2007   Erosive and ulcerative gastritis; small H.H.   ESOPHAGOGASTRODUODENOSCOPY  10/2016   WNL - sxs attributed to stress Willy Harvest)   HEMORRHOID BANDING  11/2021   Repeat March 2023   OOPHORECTOMY     PARTIAL HYSTERECTOMY  1981   Bleeding, cyst, bladder tack   RECTAL PROLAPSE REPAIR  2005   TOTAL ABDOMINAL HYSTERECTOMY  2008   With a bilateral salpingo- oophorectomy   Family History  Problem Relation Age of Onset   Hypertension Mother    COPD Mother        Prior smoker   Coronary artery disease Mother        stents   Heart attack Father    Hypertension Father    Stroke Father        Ministrokes   Coronary artery disease Father        x4 stents, Pacer   Colon cancer Father    Alzheimer's disease Father    Heart disease Sister    Testicular cancer Brother    Prostate cancer Brother    Diabetes Mellitus I Son    Breast cancer Cousin    Stomach cancer Neg Hx    Rectal cancer Neg Hx    Esophageal cancer Neg Hx    Liver cancer Neg Hx    Social History   Socioeconomic History   Marital status: Married    Spouse name: Not on file   Number of children: 2   Years of education: Not on file   Highest education level: Not on file  Occupational  History   Occupation: retired    Associate Professor: [employer]  Tobacco Use   Smoking status: Never   Smokeless tobacco: Never  Vaping Use   Vaping status: Never Used  Substance and Sexual Activity   Alcohol use: No    Alcohol/week: 0.0 standard drinks of alcohol   Drug use: No   Sexual activity: Yes    Birth control/protection: Surgical, Post-menopausal  Other Topics Concern   Not on file  Social History Narrative   Married   Lives with  husband.  2 grown children   Daily caffeine use   Retired 2013 was [employer]   Activity: walking   Diet: good water intake, fruits/vegetables daily   Social Drivers of Corporate investment banker Strain: Low Risk  (03/20/2024)   Overall Financial Resource Strain (CARDIA)    Difficulty of Paying Living Expenses: Not hard at all  Food Insecurity: No Food Insecurity (03/20/2024)   Hunger Vital Sign    Worried About Running Out of Food in the Last Year: Never true    Ran Out of Food in the Last Year: Never true  Transportation Needs: No Transportation Needs (03/20/2024)   PRAPARE - Administrator, Civil Service (Medical): No    Lack of Transportation (Non-Medical): No  Physical Activity: Insufficiently Active (03/20/2024)   Exercise Vital Sign    Days of Exercise per Week: 4 days    Minutes of Exercise per Session: 30 min  Stress: No Stress Concern Present (03/20/2024)   [employer] of Occupational Health - Occupational Stress Questionnaire    Feeling of Stress: Only a little  Social Connections: Moderately Integrated (03/20/2024)   Social Connection and Isolation Panel    Frequency of Communication with Friends and Family: More than three times a week    Frequency of Social Gatherings with Friends and Family: More than three times a week    Attends Religious Services: More than 4 times per year    Active Member of [employer] or Organizations: No    Attends Engineer, structural: Never    Marital Status:  Married    Tobacco Counseling Counseling given: Not Answered    Clinical Intake:  Pre-visit preparation completed: Yes  Pain : No/denies pain     BMI - recorded: 21.45 Nutritional Status: BMI of 19-24  Normal Nutritional Risks: None Diabetes: No  No results found for: HGBA1C   How often do you need to have someone help you when you read instructions, pamphlets, or other written materials from your doctor or pharmacy?: 1 - Never  Interpreter Needed?: No  Comments: lives with husband Information entered by :: B.Arieon Scalzo,LPN   Activities of Daily Living     03/20/2024    9:46 AM  In your present state of health, do you have any difficulty performing the following activities:  Hearing? 0  Vision? 0  Difficulty concentrating or making decisions? 0  Walking or climbing stairs? 0  Dressing or bathing? 0  Doing errands, shopping? 0  Preparing Food and eating ? N  Using the Toilet? N  In the past six months, have you accidently leaked urine? N  Do you have problems with loss of bowel control? N  Managing your Medications? N  Managing your Finances? N  Housekeeping or managing your Housekeeping? N    Patient Care Team: Claire Crick, MD as PCP - General (Family Medicine) Boyce Byes, MD as PCP - Electrophysiology (Cardiology) Kenney Peacemaker, MD as Consulting Physician (Gastroenterology) Jolly Needle, MD (Inactive) as Consulting Physician (Cardiology) Amedeo Jupiter, MD as Consulting Physician (Ophthalmology)  I have updated your Care Teams any recent Medical Services you may have received from other providers in the past year.     Assessment:   This is a routine wellness examination for [facility].  Hearing/Vision screen Hearing Screening - Comments:: Pt says her hearing is alright Vision Screening - Comments:: Pt says her vision is good;readers only Dr Vester Gouty    Goals Addressed  This Visit's Progress    COMPLETED: Increase  physical activity   Not on track    When schedule permits, I will resume exercising at least 60 minutes 3 days per week.      Patient Stated   On track    03/20/24-Maintain current health status.     Patient Stated       03/20/24- Stay healthy and active       Depression Screen     03/20/2024    9:41 AM 03/20/2023    9:52 AM 02/01/2023   11:43 AM 03/15/2022   12:04 PM 11/15/2019    2:52 PM 11/13/2018   10:36 AM 11/08/2017   11:35 AM  PHQ 2/9 Scores  PHQ - 2 Score 0 0 0 0 0 0 0  PHQ- 9 Score  0 2  0 0 0    Fall Risk     03/20/2024    9:36 AM 03/20/2023    9:54 AM 03/19/2023   12:37 PM 02/01/2023   11:43 AM 03/15/2022   12:06 PM  Fall Risk   Falls in the past year? 0 0 0 0 0  Number falls in past yr: 0 0   0  Injury with Fall? 0 0   0  Risk for fall due to : No Fall Risks No Fall Risks   No Fall Risks  Follow up Falls prevention discussed;Education provided Falls prevention discussed;Falls evaluation completed   Falls prevention discussed      Data saved with a previous flowsheet row definition    MEDICARE RISK AT HOME:  Medicare Risk at Home Any stairs in or around the home?: No If so, are there any without handrails?: Yes Home free of loose throw rugs in walkways, pet beds, electrical cords, etc?: Yes Adequate lighting in your home to reduce risk of falls?: Yes Life alert?: No Use of a cane, walker or w/c?: No Grab bars in the bathroom?: Yes Shower chair or bench in shower?: Yes Elevated toilet seat or a handicapped toilet?: Yes  TIMED UP AND GO:  Was the test performed?  No  Cognitive Function: 6CIT completed    11/15/2019    2:57 PM 11/13/2018   10:36 AM 11/08/2017   11:35 AM 07/08/2016   11:51 AM  MMSE - Mini Mental State Exam  Orientation to time 5 5 5  5    Orientation to Place 5 5 5  5    Registration 3 3 3  3    Attention/ Calculation 5 0 0  0   Recall 3 3 3  3    Language- name 2 objects  0 0  0   Language- repeat 1 1 1 1   Language- follow 3 step command  3 3   3    Language- read & follow direction  0 0  0   Write a sentence  0 0  0   Copy design  0 0  0   Total score  20 20  20       Data saved with a previous flowsheet row definition        03/20/2024    9:47 AM 03/20/2023    9:55 AM 03/15/2022   12:09 PM  6CIT Screen  What Year? 0 points 0 points 0 points  What month? 0 points 0 points 0 points  What time? 0 points 0 points 0 points  Count back from 20 0 points 0 points 0 points  Months in reverse 0 points 0 points 0  points  Repeat phrase 0 points 0 points 0 points  Total Score 0 points 0 points 0 points    Immunizations Immunization History  Administered Date(s) Administered   Fluad Quad(high Dose 65+) 07/24/2019, 07/21/2020, 08/09/2022   Influenza, High Dose Seasonal PF 07/04/2018, 07/14/2021   Influenza,inj,Quad PF,6+ Mos 07/10/2014, 07/02/2015, 07/08/2016, 07/13/2017   Influenza-Unspecified 07/03/2013, 07/07/2023   PFIZER(Purple Top)SARS-COV-2 Vaccination 11/16/2019, 12/11/2019, 09/19/2020   Pfizer(Comirnaty)Fall Seasonal Vaccine 12 years and older 08/19/2022   Pneumococcal Conjugate-13 05/19/2015   Pneumococcal Polysaccharide-23 07/08/2016   Td 10/03/1993, 06/10/2008    Screening Tests Health Maintenance  Topic Date Due   Zoster Vaccines- Shingrix (1 of 2) Never done   DTaP/Tdap/Td (3 - Tdap) 06/10/2018   COVID-19 Vaccine (5 - 2024-25 season) 06/04/2023   INFLUENZA VACCINE  05/03/2024   MAMMOGRAM  05/15/2024   Medicare Annual Wellness (AWV)  03/20/2025   Pneumococcal Vaccine: 50+ Years  Completed   DEXA SCAN  Completed   Hepatitis C Screening  Completed   HPV VACCINES  Aged Out   Meningococcal B Vaccine  Aged Out   Colonoscopy  Discontinued    Health Maintenance  Health Maintenance Due  Topic Date Due   Zoster Vaccines- Shingrix (1 of 2) Never done   DTaP/Tdap/Td (3 - Tdap) 06/10/2018   COVID-19 Vaccine (5 - 2024-25 season) 06/04/2023   Health Maintenance Items Addressed: None needed at this time. Pt will  let PCP know if she wants a MMG in the future. Pt will also go to her pharmacy for Shingles vaccine if she decides to take it.  Additional Screening:  Vision Screening: Recommended annual ophthalmology exams for early detection of glaucoma and other disorders of the eye. Would you like a referral to an eye doctor? No    Dental Screening: Recommended annual dental exams for proper oral hygiene  Community Resource Referral / Chronic Care Management: CRR required this visit?  No   CCM required this visit?  No   Plan:    I have personally reviewed and noted the following in the patient's chart:   Medical and social history Use of alcohol, tobacco or illicit drugs  Current medications and supplements including opioid prescriptions. Patient is not currently taking opioid prescriptions. Functional ability and status Nutritional status Physical activity Advanced directives List of other physicians Hospitalizations, surgeries, and ER visits in previous 12 months Vitals Screenings to include cognitive, depression, and falls Referrals and appointments  In addition, I have reviewed and discussed with patient certain preventive protocols, quality metrics, and best practice recommendations. A written personalized care plan for preventive services as well as general preventive health recommendations were provided to patient.   Nerissa Bannister, LPN   1/61/0960   After Visit Summary: (MyChart) Due to this being a telephonic visit, the after visit summary with patients personalized plan was offered to patient via MyChart   Notes: Nothing significant to report at this time.

## 2024-03-20 NOTE — Patient Instructions (Signed)
 Alexis Bentley , Thank you for taking time out of your busy schedule to complete your Annual Wellness Visit with me. I enjoyed our conversation and look forward to speaking with you again next year. I, as well as your care team,  appreciate your ongoing commitment to your health goals. Please review the following plan we discussed and let me know if I can assist you in the future. Your Game plan/ To Do List    Follow up Visits: Next Medicare AWV with our clinical staff: 03/21/25 @ 10:10am televisit   Have you seen your provider in the last 6 months (3 months if uncontrolled diabetes)? No Next Office Visit with your provider: 09/18/24  Clinician Recommendations:  Aim for 30 minutes of exercise or brisk walking, 6-8 glasses of water, and 5 servings of fruits and vegetables each day.       This is a list of the screening recommended for you and due dates:  Health Maintenance  Topic Date Due   Zoster (Shingles) Vaccine (1 of 2) Never done   DTaP/Tdap/Td vaccine (3 - Tdap) 06/10/2018   COVID-19 Vaccine (5 - 2024-25 season) 06/04/2023   Flu Shot  05/03/2024   Mammogram  05/15/2024   Medicare Annual Wellness Visit  03/20/2025   Pneumococcal Vaccine for age over 11  Completed   DEXA scan (bone density measurement)  Completed   Hepatitis C Screening  Completed   HPV Vaccine  Aged Out   Meningitis B Vaccine  Aged Out   Colon Cancer Screening  Discontinued    Advanced directives: (Copy Requested) Please bring a copy of your health care power of attorney and living will to the office to be added to your chart at your convenience. You can mail to [facility] [address] [place], [place] [address] or email to [email or web address] Advance Care Planning is important because it:  [x]  Makes sure you receive the medical care that is consistent with your values, goals, and preferences  [x]  It provides guidance to your family and loved ones and reduces their decisional burden  about whether or not they are making the right decisions based on your wishes.  Follow the link provided in your after visit summary or read over the paperwork we have mailed to you to help you started getting your Advance Directives in place. If you need assistance in completing these, please reach out to us  so that we can help you!

## 2024-04-15 ENCOUNTER — Other Ambulatory Visit: Payer: Self-pay | Admitting: Family Medicine

## 2024-04-15 DIAGNOSIS — Z1231 Encounter for screening mammogram for malignant neoplasm of breast: Secondary | ICD-10-CM

## 2024-05-18 ENCOUNTER — Other Ambulatory Visit: Payer: Self-pay | Admitting: Cardiology

## 2024-05-18 DIAGNOSIS — I480 Paroxysmal atrial fibrillation: Secondary | ICD-10-CM

## 2024-05-20 ENCOUNTER — Ambulatory Visit
Admission: RE | Admit: 2024-05-20 | Discharge: 2024-05-20 | Disposition: A | Discharge status: Home / Self Care | Source: Ambulatory Visit | Attending: Family Medicine | Admitting: Family Medicine

## 2024-05-20 DIAGNOSIS — Z1231 Encounter for screening mammogram for malignant neoplasm of breast: Secondary | ICD-10-CM | POA: Insufficient documentation

## 2024-05-22 ENCOUNTER — Ambulatory Visit: Payer: Self-pay | Admitting: Family Medicine

## 2024-06-05 ENCOUNTER — Other Ambulatory Visit: Payer: Self-pay | Admitting: Cardiology

## 2024-06-05 DIAGNOSIS — I480 Paroxysmal atrial fibrillation: Secondary | ICD-10-CM

## 2024-06-14 ENCOUNTER — Other Ambulatory Visit: Payer: Self-pay | Admitting: Cardiology

## 2024-06-14 DIAGNOSIS — I480 Paroxysmal atrial fibrillation: Secondary | ICD-10-CM

## 2024-06-30 ENCOUNTER — Other Ambulatory Visit: Payer: Self-pay | Admitting: Cardiology

## 2024-06-30 DIAGNOSIS — I480 Paroxysmal atrial fibrillation: Secondary | ICD-10-CM

## 2024-07-01 ENCOUNTER — Other Ambulatory Visit: Payer: Self-pay | Admitting: Cardiology

## 2024-07-01 DIAGNOSIS — Z23 Encounter for immunization: Secondary | ICD-10-CM | POA: Diagnosis not present

## 2024-07-01 DIAGNOSIS — I480 Paroxysmal atrial fibrillation: Secondary | ICD-10-CM

## 2024-07-01 NOTE — Telephone Encounter (Signed)
 Prescription refill request for Eliquis  received. Indication: PAF Last office visit: 06/06/23  CHRISTELLA Passey PA-C Scr: 0.96 on 02/01/23   Epic Age: 78 Weight: 55kg  Based on above findings Eliquis  5mg  twice daily is the appropriate dose. Pt is apst due for appt with MD and Lab work.  Message sent to schedulers.   Refill approved x 2.

## 2024-07-08 ENCOUNTER — Other Ambulatory Visit: Payer: Self-pay | Admitting: Cardiology

## 2024-07-08 DIAGNOSIS — I480 Paroxysmal atrial fibrillation: Secondary | ICD-10-CM

## 2024-07-10 ENCOUNTER — Other Ambulatory Visit: Payer: Self-pay | Admitting: Cardiology

## 2024-07-10 DIAGNOSIS — I480 Paroxysmal atrial fibrillation: Secondary | ICD-10-CM

## 2024-07-18 ENCOUNTER — Other Ambulatory Visit: Payer: Self-pay | Admitting: Cardiology

## 2024-07-18 DIAGNOSIS — I480 Paroxysmal atrial fibrillation: Secondary | ICD-10-CM

## 2024-07-29 ENCOUNTER — Telehealth: Payer: Self-pay | Admitting: Cardiology

## 2024-07-29 DIAGNOSIS — I480 Paroxysmal atrial fibrillation: Secondary | ICD-10-CM

## 2024-07-29 MED ORDER — METOPROLOL TARTRATE 25 MG PO TABS: 25.0000 mg | ORAL_TABLET | Freq: Two times a day (BID) | ORAL | 0 refills | Status: DC

## 2024-07-29 NOTE — Telephone Encounter (Signed)
*  STAT* If patient is at the pharmacy, call can be transferred to refill team.   1. Which medications need to be refilled? (please list name of each medication and dose if known) metoprolol  tartrate (LOPRESSOR ) 25 MG tablet    2. Would you like to learn more about the convenience, safety, & potential cost savings by using the [facility]? no   3. Are you open to using the [facility] (Type [facility].  ).no   4. Which pharmacy/location (including street and city if local pharmacy) is medication to be sent to?[id number] - [place], [place] - [address]    5. Do they need a 30 day or 90 day supply? 30 day

## 2024-07-29 NOTE — Telephone Encounter (Signed)
 Called patient made her an appointment to see Jodie Passey PA.

## 2024-08-05 NOTE — Progress Notes (Unsigned)
  Electrophysiology Office Note:   Date:  08/06/2024  ID:  DAQUANA PADDOCK, DOB 22-Aug-1946, MRN [id number]  Primary Cardiologist: None Electrophysiologist: OLE ONEIDA HOLTS, MD   Electrophysiologist:  OLE ONEIDA HOLTS, MD      History of Present Illness:   Alexis Bentley is a 78 y.o. female with h/o PAF and HTN seen today for routine electrophysiology followup.   Since last being seen in our clinic the patient reports doing very well. No breakthrough arrhythmia of which she is aware. Mild SOB walking up a hill or lots of stairs. Otherwise, she denies chest pain, palpitations, dyspnea, PND, orthopnea, nausea, vomiting, dizziness, syncope, edema, weight gain, or early satiety.   Review of systems complete and found to be negative unless listed in HPI.   EP Information / Studies Reviewed:    EKG is ordered today. Personal review as below.  EKG Interpretation Date/Time:  Tuesday August 06 2024 08:22:49 EST Ventricular Rate:  56 PR Interval:  164 QRS Duration:  76 QT Interval:  432 QTC Calculation: 416 R Axis:   61  Text Interpretation: Sinus bradycardia When compared with ECG of 06-Jun-2023 09:49, No significant change was found Confirmed by Lesia Sharper [phone]) on 08/06/2024 8:23:26 AM    Arrhythmia/Device History No specialty comments available.   Physical Exam:   VS:  BP (!) 140/72 (BP Location: Left Arm, Patient Position: Sitting, Cuff Size: Normal)   Pulse 64   Resp 16   Ht 5' 3 (1.6 m)   Wt 129 lb (58.5 kg)   SpO2 97%   BMI 22.85 kg/m    Wt Readings from Last 3 Encounters:  08/06/24 129 lb (58.5 kg)  03/20/24 123 lb (55.8 kg)  10/19/23 123 lb 2 oz (55.8 kg)     GEN: No acute distress NECK: No JVD; No carotid bruits CARDIAC: Regular rate and rhythm, no murmurs, rubs, gallops RESPIRATORY:  Clear to auscultation without rales, wheezing or rhonchi  ABDOMEN: Soft, non-tender, non-distended EXTREMITIES:  No edema; No deformity   ASSESSMENT AND PLAN:     Paroxysmal Atrial fibrillation EKG today shows sinus bradycardia Continue eliquis  5 mg BID for CHA2DS2VASc of at least 3 Labs today  HTN Stable on current regimen   Secondary hypercoagulable state Pt on Eliquis  as above    Follow up with EP Team in 12 months  Signed, Sharper Prentice Lesia, PA-C

## 2024-08-06 ENCOUNTER — Other Ambulatory Visit: Payer: Self-pay | Admitting: Cardiology

## 2024-08-06 ENCOUNTER — Encounter: Payer: Self-pay | Admitting: Student

## 2024-08-06 ENCOUNTER — Ambulatory Visit: Attending: Student | Admitting: Student

## 2024-08-06 VITALS — BP 140/72 | HR 64 | Resp 16 | Ht 63.0 in | Wt 129.0 lb

## 2024-08-06 DIAGNOSIS — I10 Essential (primary) hypertension: Secondary | ICD-10-CM | POA: Diagnosis not present

## 2024-08-06 DIAGNOSIS — I480 Paroxysmal atrial fibrillation: Secondary | ICD-10-CM | POA: Insufficient documentation

## 2024-08-06 DIAGNOSIS — D6869 Other thrombophilia: Secondary | ICD-10-CM | POA: Diagnosis not present

## 2024-08-06 LAB — CBC

## 2024-08-06 NOTE — Patient Instructions (Signed)
 Medication Instructions:  No medication changes today. *If you need a refill on your cardiac medications before your next appointment, please call your pharmacy*  Lab Work: BMET and CBC today If you have labs (blood work) drawn today and your tests are completely normal, you will receive your results only by: MyChart Message (if you have MyChart) OR A paper copy in the mail If you have any lab test that is abnormal or we need to change your treatment, we will call you to review the results.  Testing/Procedures: No testing ordered today  Follow-Up: At [facility], you and your health needs are our priority.  As part of our continuing mission to provide you with exceptional heart care, our providers are all part of one team.  This team includes your primary Cardiologist (physician) and Advanced Practice Providers or APPs (Physician Assistants and Nurse Practitioners) who all work together to provide you with the care you need, when you need it.  Your next appointment:   12 month(s)  Provider:   You may see Boyce Byes, MD or one of the following Advanced Practice Providers on your designated Care Team:   Mertha Abrahams, [place] Bambi Lever "Jonelle Neri" [place], PA-C Suzann Riddle, NP Creighton Doffing, NP    We recommend signing up for the patient portal called "MyChart".  Sign up information is provided on this After Visit Summary.  MyChart is used to connect with patients for Virtual Visits (Telemedicine).  Patients are able to view lab/test results, encounter notes, upcoming appointments, etc.  Non-urgent messages can be sent to your provider as well.   To learn more about what you can do with MyChart, go to [email or web address].

## 2024-08-07 ENCOUNTER — Other Ambulatory Visit: Payer: Self-pay | Admitting: *Deleted

## 2024-08-07 ENCOUNTER — Ambulatory Visit: Payer: Self-pay | Admitting: Student

## 2024-08-07 DIAGNOSIS — Z79899 Other long term (current) drug therapy: Secondary | ICD-10-CM

## 2024-08-07 DIAGNOSIS — I480 Paroxysmal atrial fibrillation: Secondary | ICD-10-CM

## 2024-08-07 LAB — CBC
Hematocrit: 35.5 % (ref 34.0–46.6)
Hemoglobin: 11.5 g/dL (ref 11.1–15.9)
MCH: 30.7 pg (ref 26.6–33.0)
MCHC: 32.4 g/dL (ref 31.5–35.7)
MCV: 95 fL (ref 79–97)
Platelets: 279 x10E3/uL (ref 150–450)
RBC: 3.75 x10E6/uL — AB (ref 3.77–5.28)
RDW: 12.3 % (ref 11.7–15.4)
WBC: 6.8 x10E3/uL (ref 3.4–10.8)

## 2024-08-07 LAB — BASIC METABOLIC PANEL WITH GFR
BUN/Creatinine Ratio: 28 (ref 12–28)
BUN: 33 mg/dL — ABNORMAL HIGH (ref 8–27)
CO2: 22 mmol/L (ref 20–29)
Calcium: 10.1 mg/dL (ref 8.7–10.3)
Chloride: 101 mmol/L (ref 96–106)
Creatinine, Ser: 1.18 mg/dL — ABNORMAL HIGH (ref 0.57–1.00)
Glucose: 79 mg/dL (ref 70–99)
Potassium: 5.4 mmol/L — ABNORMAL HIGH (ref 3.5–5.2)
Sodium: 140 mmol/L (ref 134–144)
eGFR: 48 mL/min/1.73 — ABNORMAL LOW (ref >59)

## 2024-08-14 LAB — BASIC METABOLIC PANEL WITH GFR
BUN/Creatinine Ratio: 23 (ref 12–28)
BUN: 24 mg/dL (ref 8–27)
CO2: 24 mmol/L (ref 20–29)
Calcium: 9.9 mg/dL (ref 8.7–10.3)
Chloride: 99 mmol/L (ref 96–106)
Creatinine, Ser: 1.05 mg/dL — ABNORMAL HIGH (ref 0.57–1.00)
Glucose: 84 mg/dL (ref 70–99)
Potassium: 4.8 mmol/L (ref 3.5–5.2)
Sodium: 138 mmol/L (ref 134–144)
eGFR: 55 mL/min/1.73 — ABNORMAL LOW (ref >59)

## 2024-08-15 ENCOUNTER — Ambulatory Visit: Payer: Self-pay | Admitting: Student

## 2024-08-27 ENCOUNTER — Other Ambulatory Visit: Payer: Self-pay | Admitting: Cardiology

## 2024-08-27 DIAGNOSIS — I480 Paroxysmal atrial fibrillation: Secondary | ICD-10-CM

## 2024-08-27 NOTE — Telephone Encounter (Signed)
 Prescription refill request for Eliquis  received. Indication:afib Last office visit:11/25 Scr: 1.05  11/25 Age:78 Weight:58.5  kg  Prescription refilled

## 2024-09-08 ENCOUNTER — Other Ambulatory Visit: Payer: Self-pay | Admitting: Family Medicine

## 2024-09-08 DIAGNOSIS — E538 Deficiency of other specified B group vitamins: Secondary | ICD-10-CM

## 2024-09-08 DIAGNOSIS — N1831 Chronic kidney disease, stage 3a: Secondary | ICD-10-CM

## 2024-09-08 DIAGNOSIS — E785 Hyperlipidemia, unspecified: Secondary | ICD-10-CM

## 2024-09-08 DIAGNOSIS — M810 Age-related osteoporosis without current pathological fracture: Secondary | ICD-10-CM

## 2024-09-11 ENCOUNTER — Other Ambulatory Visit: Payer: Medicare Other

## 2024-09-11 DIAGNOSIS — E785 Hyperlipidemia, unspecified: Secondary | ICD-10-CM | POA: Diagnosis not present

## 2024-09-11 DIAGNOSIS — M810 Age-related osteoporosis without current pathological fracture: Secondary | ICD-10-CM | POA: Diagnosis not present

## 2024-09-11 DIAGNOSIS — N1831 Chronic kidney disease, stage 3a: Secondary | ICD-10-CM | POA: Diagnosis not present

## 2024-09-11 DIAGNOSIS — E538 Deficiency of other specified B group vitamins: Secondary | ICD-10-CM

## 2024-09-11 LAB — CBC WITH DIFFERENTIAL/PLATELET
Basophils Absolute: 0.1 K/uL (ref 0.0–0.1)
Basophils Relative: 1.5 % (ref 0.0–3.0)
Eosinophils Absolute: 0.2 K/uL (ref 0.0–0.7)
Eosinophils Relative: 4.7 % (ref 0.0–5.0)
HCT: 34.4 % — ABNORMAL LOW (ref 36.0–46.0)
Hemoglobin: 11.8 g/dL — ABNORMAL LOW (ref 12.0–15.0)
Lymphocytes Relative: 41.7 % (ref 12.0–46.0)
Lymphs Abs: 1.8 K/uL (ref 0.7–4.0)
MCHC: 34.2 g/dL (ref 30.0–36.0)
MCV: 91.8 fl (ref 78.0–100.0)
Monocytes Absolute: 0.4 K/uL (ref 0.1–1.0)
Monocytes Relative: 8.1 % (ref 3.0–12.0)
Neutro Abs: 1.9 K/uL (ref 1.4–7.7)
Neutrophils Relative %: 44 % (ref 43.0–77.0)
Platelets: 236 K/uL (ref 150.0–400.0)
RBC: 3.75 Mil/uL — ABNORMAL LOW (ref 3.87–5.11)
RDW: 12.8 % (ref 11.5–15.5)
WBC: 4.4 K/uL (ref 4.0–10.5)

## 2024-09-11 LAB — LIPID PANEL
Cholesterol: 245 mg/dL — ABNORMAL HIGH (ref 0–200)
HDL: 62.1 mg/dL (ref >39.00)
LDL Cholesterol: 139 mg/dL — ABNORMAL HIGH (ref 0–99)
NonHDL: 182.75
Total CHOL/HDL Ratio: 4
Triglycerides: 221 mg/dL — ABNORMAL HIGH (ref 0.0–149.0)
VLDL: 44.2 mg/dL — ABNORMAL HIGH (ref 0.0–40.0)

## 2024-09-11 LAB — COMPREHENSIVE METABOLIC PANEL WITH GFR
ALT: 14 U/L (ref 0–35)
AST: 19 U/L (ref 0–37)
Albumin: 4.3 g/dL (ref 3.5–5.2)
Alkaline Phosphatase: 97 U/L (ref 39–117)
BUN: 37 mg/dL — ABNORMAL HIGH (ref 6–23)
CO2: 30 meq/L (ref 19–32)
Calcium: 9.6 mg/dL (ref 8.4–10.5)
Chloride: 103 meq/L (ref 96–112)
Creatinine, Ser: 1.2 mg/dL (ref 0.40–1.20)
GFR: 44.01 mL/min — ABNORMAL LOW (ref >60.00)
Glucose, Bld: 84 mg/dL (ref 70–99)
Potassium: 4.5 meq/L (ref 3.5–5.1)
Sodium: 139 meq/L (ref 135–145)
Total Bilirubin: 0.3 mg/dL (ref 0.2–1.2)
Total Protein: 6.7 g/dL (ref 6.0–8.3)

## 2024-09-11 LAB — PHOSPHORUS: Phosphorus: 4.5 mg/dL (ref 2.3–4.6)

## 2024-09-11 LAB — VITAMIN B12: Vitamin B-12: 692 pg/mL (ref 211–911)

## 2024-09-11 LAB — MICROALBUMIN / CREATININE URINE RATIO
Creatinine,U: 53.3 mg/dL
Microalb Creat Ratio: UNDETERMINED mg/g (ref 0.0–30.0)
Microalb, Ur: <0.7 mg/dL

## 2024-09-11 LAB — VITAMIN D 25 HYDROXY (VIT D DEFICIENCY, FRACTURES): VITD: 48.97 ng/mL (ref 30.00–100.00)

## 2024-09-12 ENCOUNTER — Ambulatory Visit: Payer: Self-pay | Admitting: Family Medicine

## 2024-09-12 LAB — PARATHYROID HORMONE, INTACT (NO CA): PTH: 26 pg/mL (ref 16–77)

## 2024-09-18 ENCOUNTER — Ambulatory Visit: Payer: Medicare Other | Admitting: Family Medicine

## 2024-09-18 ENCOUNTER — Encounter: Payer: Self-pay | Admitting: Family Medicine

## 2024-09-18 VITALS — BP 142/82 | HR 57 | Temp 98.0°F | Ht 63.39 in | Wt 128.4 lb

## 2024-09-18 DIAGNOSIS — E785 Hyperlipidemia, unspecified: Secondary | ICD-10-CM

## 2024-09-18 DIAGNOSIS — Z23 Encounter for immunization: Secondary | ICD-10-CM | POA: Diagnosis not present

## 2024-09-18 DIAGNOSIS — E739 Lactose intolerance, unspecified: Secondary | ICD-10-CM

## 2024-09-18 DIAGNOSIS — K219 Gastro-esophageal reflux disease without esophagitis: Secondary | ICD-10-CM

## 2024-09-18 DIAGNOSIS — I480 Paroxysmal atrial fibrillation: Secondary | ICD-10-CM

## 2024-09-18 DIAGNOSIS — M810 Age-related osteoporosis without current pathological fracture: Secondary | ICD-10-CM

## 2024-09-18 DIAGNOSIS — Z7189 Other specified counseling: Secondary | ICD-10-CM | POA: Diagnosis not present

## 2024-09-18 DIAGNOSIS — I10 Essential (primary) hypertension: Secondary | ICD-10-CM | POA: Diagnosis not present

## 2024-09-18 DIAGNOSIS — N1831 Chronic kidney disease, stage 3a: Secondary | ICD-10-CM | POA: Diagnosis not present

## 2024-09-18 MED ORDER — AMLODIPINE BESYLATE 2.5 MG PO TABS: 2.5000 mg | ORAL_TABLET | Freq: Every day | ORAL | 3 refills | Status: AC

## 2024-09-18 MED ORDER — OMEPRAZOLE 40 MG PO CPDR: 40.0000 mg | DELAYED_RELEASE_CAPSULE | Freq: Every day | ORAL | 3 refills | Status: AC

## 2024-09-18 NOTE — Assessment & Plan Note (Addendum)
 Chronic, mildly elevated today and at home.  In CKD hx, continue lisinopril , add amlodipine  2.5mg  daily.

## 2024-09-18 NOTE — Assessment & Plan Note (Addendum)
 Has this at home - asked to bring us  a copy.

## 2024-09-18 NOTE — Patient Instructions (Addendum)
 Call to to schedule bone density scan at your convenience: [facility] at [facility] [phone] Prevnar-20 today (pneumonia shot) Ask pharmacy for dates of shingles shot to update your chart.  Increase water intake for kidney health. Kidney disease handout provided today  Start amlodipine  2.5mg  daily for better blood pressure control.  Bring us  a copy of your advanced directives Return as needed or in 1 year for follow up visit

## 2024-09-18 NOTE — Assessment & Plan Note (Addendum)
 Chronic issue, latest EGD 2018 WNL Continues omeprazole  40mg  daily with prn pepcid  Offered return to GI with episodes of vomiting - she will consider.

## 2024-09-18 NOTE — Assessment & Plan Note (Signed)
 Reviewed CKD with patient, CKD handout provided Rec tighter BP control - goal <130/80 if able to tolerate this. Will start amlodipine  2.5mg  daily, update if any trouble tolerating.

## 2024-09-18 NOTE — Assessment & Plan Note (Addendum)
 Continue regular calcium/vit D intake and regular weight bearing exercises.  Declines medication at this time but agrees to repeat DEXA scan as would consider further treatment if worsening osteoporosis noted.  Provided with osteoporosis diet handout Would avoid oral bisphosphonate in h/o PUD and GERD.

## 2024-09-18 NOTE — Assessment & Plan Note (Signed)
 Chronic, off medication. Deteriorated control noted. Reviewed diet choices to improve cholesterol control.  The 10-year ASCVD risk score (Arnett DK, et al., 2019) is: 28.3%   Values used to calculate the score:     Age: 78 years     Clinically relevant sex: Female     Is Non-Hispanic African American: No     Diabetic: No     Tobacco smoker: No     Systolic Blood Pressure: 142 mmHg     Is BP treated: Yes     HDL Cholesterol: 62.1 mg/dL     Total Cholesterol: 245 mg/dL

## 2024-09-18 NOTE — Assessment & Plan Note (Addendum)
 Sees EP, on eliquis  and BB

## 2024-09-18 NOTE — Progress Notes (Signed)
 Ph: [phone] Fax: [phone]   Patient ID: Alexis Bentley, female    DOB: 04-04-1946, 78 y.o.   MRN: [id number]  This visit was conducted in person.  BP (!) 142/82 (BP Location: Right Arm, Cuff Size: Normal)   Pulse (!) 57   Temp 98 F (36.7 C) (Oral)   Ht 5' 3.39 (1.61 m)   Wt 128 lb 6.4 oz (58.2 kg)   SpO2 100%   BMI 22.47 kg/m   BP Readings from Last 3 Encounters:  09/18/24 (!) 142/82  08/06/24 (!) 140/72  10/19/23 (!) 150/70   CC: CPE Subjective:   HPI: Alexis Bentley is a 78 y.o. female presenting on 09/18/2024 for Annual Exam (Wants to go over blood work)   [employer] 03/2024 for medicare wellness visit. Note reviewed.   No results found.  Flowsheet Row Office Visit from 09/18/2024 in [facility] [place]  PHQ-2 Total Score 0       09/18/2024   11:40 AM 03/20/2024    9:36 AM 03/20/2023    9:54 AM 03/19/2023   12:37 PM 02/01/2023   11:43 AM  Fall Risk   Falls in the past year? 0 0 0 0  0  Number falls in past yr: 0 0 0    Injury with Fall? 0 0  0     Risk for fall due to :  No Fall Risks No Fall Risks    Follow up Falls evaluation completed Falls prevention discussed;Education provided Falls prevention discussed;Falls evaluation completed       Manually entered by patient   Data saved with a previous flowsheet row definition    Afib on eliquis  and BB.  Notes exertional dyspnea when going uphill.   GERD - on daily chronic PPI since 2013, occ pepcid  at bedtime. S/p EGD 2018. Breakthrough symptoms if dose missed. Lactose intolerance - now using lactaid milk. Notes about once a month she develops abdominal pain followed by nausea/vomiting. Tries to avoid Mexican food due to this.    Preventative: COLONOSCOPY 01/2018 - WNL, no follow up recommended Ollen) Well woman exam - with OBGYN last with Dr. VanDalen 08/2015 [facility]). He retired. S/p complete hysterectomy, ovaries removed as well. S/p rectal prolapse  surgery 2005.  Mammo 05/2024 - Birads1 @ [place] DEXA T score -2.8 R femur (12/2017).  DEXA 04/2022 T -3.0 R femur, -3.6 L forearm.  Reviewed cal/vit D intake, encouraged regular walking. Limited milk, does eat cheese. Not interested in bone strengthening medicine.  Lung cancer screen - not eligible  Flu shot yearly  Pfizer covid vaccine 11/2019, 12/2019, booster 09/2020 Prevnar-13 2016, pneumovax 2017, prevnar-20 today  Td 2009 Shingrix - states pharmacy told her she already completed this Advanced directive: completed at home, asked to bring a copy. Would want husband then youngest son [place]) to be HCPOA.  Seat belt use discussed Sunscreen use discussed. No changing moles on skin. Sees [place] derm yearly. Non smoker Alcohol - none Dentist q6 mo  Eye exam yearly  Bowel - no constipation  Bladder - occasional leaks, wears liner - urge > stress incontinence, not currently bothersome   Married Lives with husband.  2 grown children Daily caffeine use - 3 cups/day in cold weather Retired 2013 from the [employer]  Activity: walking as well as water therapy with husband  Diet: good water intake, fruits/vegetables daily       Relevant past medical, surgical, family and social history reviewed  and updated as indicated. Interim medical history since our last visit reviewed. Allergies and medications reviewed and updated. Outpatient Medications Prior to Visit  Medication Sig Dispense Refill   apixaban  (ELIQUIS ) 5 MG TABS tablet TAKE 1 TABLET BY MOUTH TWICE A DAY 60 tablet 5   Calcium Carbonate-Vitamin D  600-400 MG-UNIT tablet Take 1 tablet by mouth 2 (two) times daily.     famotidine  (PEPCID ) 20 MG tablet Take 1 tablet (20 mg total) by mouth at bedtime as needed for heartburn or indigestion.     lisinopril  (ZESTRIL ) 20 MG tablet TAKE 1 TABLET BY MOUTH EVERY DAY 90 tablet 3   metoprolol  tartrate (LOPRESSOR ) 25 MG tablet Take 1 tablet (25 mg total) by mouth 2 (two) times daily.  180 tablet 0   vitamin B-12 (CYANOCOBALAMIN ) 500 MCG tablet Take 1 tablet (500 mcg total) by mouth every Monday, Wednesday, and Friday.     omeprazole  (PRILOSEC) 40 MG capsule Take 1 capsule (40 mg total) by mouth daily. 90 capsule 4   No facility-administered medications prior to visit.     Per HPI unless specifically indicated in ROS section below Review of Systems  Constitutional:  Negative for activity change, appetite change, chills, fatigue, fever and unexpected Bentley change.  HENT:  Negative for hearing loss.   Eyes:  Negative for visual disturbance.  Respiratory:  Positive for shortness of breath (exertional - up an incline). Negative for cough, chest tightness and wheezing.   Cardiovascular:  Negative for chest pain, palpitations and leg swelling.  Gastrointestinal:  Positive for nausea (occ) and vomiting (every 1-2 months). Negative for abdominal distention, abdominal pain, blood in stool, constipation and diarrhea.        No melena or coffee ground emesis No early satiety or dysphagia or unexpected Bentley loss  No epigastric or back pain   Genitourinary:  Negative for difficulty urinating and hematuria.  Musculoskeletal:  Negative for arthralgias, myalgias and neck pain.  Skin:  Negative for rash.  Neurological:  Negative for dizziness, seizures, syncope and headaches.  Hematological:  Negative for adenopathy. Does not bruise/bleed easily.  Psychiatric/Behavioral:  Negative for dysphoric mood. The patient is not nervous/anxious.     Objective:  BP (!) 142/82 (BP Location: Right Arm, Cuff Size: Normal)   Pulse (!) 57   Temp 98 F (36.7 C) (Oral)   Ht 5' 3.39 (1.61 m)   Wt 128 lb 6.4 oz (58.2 kg)   SpO2 100%   BMI 22.47 kg/m   Wt Readings from Last 3 Encounters:  09/18/24 128 lb 6.4 oz (58.2 kg)  08/06/24 129 lb (58.5 kg)  03/20/24 123 lb (55.8 kg)      Physical Exam Vitals and nursing note reviewed.  Constitutional:      Appearance: Normal appearance. She is  not ill-appearing.  HENT:     Head: Normocephalic and atraumatic.     Right Ear: Tympanic membrane, ear canal and external ear normal. There is no impacted cerumen.     Left Ear: Tympanic membrane, ear canal and external ear normal. There is no impacted cerumen.     Mouth/Throat:     Mouth: Mucous membranes are moist.     Pharynx: Oropharynx is clear. No oropharyngeal exudate or posterior oropharyngeal erythema.  Eyes:     General:        Right eye: No discharge.        Left eye: No discharge.     Extraocular Movements: Extraocular movements intact.     Conjunctiva/sclera:  Conjunctivae normal.     Pupils: Pupils are equal, round, and reactive to light.  Neck:     Thyroid : No thyroid  mass or thyromegaly.     Vascular: No carotid bruit.  Cardiovascular:     Rate and Rhythm: Normal rate and regular rhythm.     Pulses: Normal pulses.     Heart sounds: Normal heart sounds. No murmur heard. Pulmonary:     Effort: Pulmonary effort is normal. No respiratory distress.     Breath sounds: Normal breath sounds. No wheezing, rhonchi or rales.  Abdominal:     General: Bowel sounds are normal. There is no distension.     Palpations: Abdomen is soft. There is no mass.     Tenderness: There is no abdominal tenderness. There is no guarding or rebound.     Hernia: No hernia is present.  Musculoskeletal:     Cervical back: Normal range of motion and neck supple. No rigidity.     Right lower leg: No edema.     Left lower leg: No edema.  Lymphadenopathy:     Cervical: No cervical adenopathy.  Skin:    General: Skin is warm and dry.     Findings: No rash.  Neurological:     General: No focal deficit present.     Mental Status: She is alert. Mental status is at baseline.  Psychiatric:        Mood and Affect: Mood normal.        Behavior: Behavior normal.       Results for orders placed or performed in visit on 09/11/24  Vitamin B12   Collection Time: 09/11/24  7:51 AM  Result Value Ref  Range   Vitamin B-12 692 211 - 911 pg/mL  CBC with Differential/Platelet   Collection Time: 09/11/24  7:51 AM  Result Value Ref Range   WBC 4.4 4.0 - 10.5 K/uL   RBC 3.75 (L) 3.87 - 5.11 Mil/uL   Hemoglobin 11.8 (L) 12.0 - 15.0 g/dL   HCT 65.5 (L) 63.9 - 53.9 %   MCV 91.8 78.0 - 100.0 fl   MCHC 34.2 30.0 - 36.0 g/dL   RDW 87.1 88.4 - 84.4 %   Platelets 236.0 150.0 - 400.0 K/uL   Neutrophils Relative % 44.0 43.0 - 77.0 %   Lymphocytes Relative 41.7 12.0 - 46.0 %   Monocytes Relative 8.1 3.0 - 12.0 %   Eosinophils Relative 4.7 0.0 - 5.0 %   Basophils Relative 1.5 0.0 - 3.0 %   Neutro Abs 1.9 1.4 - 7.7 K/uL   Lymphs Abs 1.8 0.7 - 4.0 K/uL   Monocytes Absolute 0.4 0.1 - 1.0 K/uL   Eosinophils Absolute 0.2 0.0 - 0.7 K/uL   Basophils Absolute 0.1 0.0 - 0.1 K/uL  Parathyroid hormone, intact (no Ca)   Collection Time: 09/11/24  7:51 AM  Result Value Ref Range   PTH 26 16 - 77 pg/mL  Microalbumin / creatinine urine ratio   Collection Time: 09/11/24  7:51 AM  Result Value Ref Range   Microalb, Ur <0.7 mg/dL   Creatinine,U 46.6 mg/dL   Microalb Creat Ratio Unable to calculate 0.0 - 30.0 mg/g  VITAMIN D  25 Hydroxy (Vit-D Deficiency, Fractures)   Collection Time: 09/11/24  7:51 AM  Result Value Ref Range   VITD 48.97 30.00 - 100.00 ng/mL  Phosphorus   Collection Time: 09/11/24  7:51 AM  Result Value Ref Range   Phosphorus 4.5 2.3 - 4.6 mg/dL  Comprehensive metabolic  panel with GFR   Collection Time: 09/11/24  7:51 AM  Result Value Ref Range   Sodium 139 135 - 145 mEq/L   Potassium 4.5 3.5 - 5.1 mEq/L   Chloride 103 96 - 112 mEq/L   CO2 30 19 - 32 mEq/L   Glucose, Bld 84 70 - 99 mg/dL   BUN 37 (H) 6 - 23 mg/dL   Creatinine, Ser 8.79 0.40 - 1.20 mg/dL   Total Bilirubin 0.3 0.2 - 1.2 mg/dL   Alkaline Phosphatase 97 39 - 117 U/L   AST 19 0 - 37 U/L   ALT 14 0 - 35 U/L   Total Protein 6.7 6.0 - 8.3 g/dL   Albumin 4.3 3.5 - 5.2 g/dL   GFR 55.98 (L) >39.99 mL/min   Calcium 9.6  8.4 - 10.5 mg/dL  Lipid panel   Collection Time: 09/11/24  7:51 AM  Result Value Ref Range   Cholesterol 245 (H) 0 - 200 mg/dL   Triglycerides 778.9 (H) 0.0 - 149.0 mg/dL   HDL 37.89 >60.99 mg/dL   VLDL 55.7 (H) 0.0 - 59.9 mg/dL   LDL Cholesterol 860 (H) 0 - 99 mg/dL   Total CHOL/HDL Ratio 4    NonHDL 182.75     Assessment & Plan:   Problem List Items Addressed This Visit     Advanced directives, counseling/discussion - Primary (Chronic)   Has this at home - asked to bring us  a copy       Essential hypertension   Chronic, mildly elevated today and at home.  In CKD hx, continue lisinopril , add amlodipine  2.5mg  daily.       Relevant Medications   amLODipine  (NORVASC ) 2.5 MG tablet   GERD   Chronic issue, latest EGD 2018 WNL Continues omeprazole  40mg  daily with prn pepcid  Offered return to GI with episodes of vomiting - she will consider.      Relevant Medications   omeprazole  (PRILOSEC) 40 MG capsule   Osteoporosis   Continue regular calcium/vit D intake and regular Bentley bearing exercises.  Declines medication at this time but agrees to repeat DEXA scan as would consider further treatment if worsening osteoporosis noted.  Provided with osteoporosis diet handout Would avoid oral bisphosphonate in h/o PUD and GERD.       Relevant Orders   DG Bone Density   HLD (hyperlipidemia)   Chronic, off medication. Deteriorated control noted. Reviewed diet choices to improve cholesterol control.  The 10-year ASCVD risk score (Arnett DK, et al., 2019) is: 28.3%   Values used to calculate the score:     Age: 62 years     Clinically relevant sex: Female     Is Non-Hispanic African American: No     Diabetic: No     Tobacco smoker: No     Systolic Blood Pressure: 142 mmHg     Is BP treated: Yes     HDL Cholesterol: 62.1 mg/dL     Total Cholesterol: 245 mg/dL       Relevant Medications   amLODipine  (NORVASC ) 2.5 MG tablet   Paroxysmal atrial fibrillation (HCC)   Sees EP,  on eliquis  and BB       Relevant Medications   amLODipine  (NORVASC ) 2.5 MG tablet   Lactose intolerance   CKD (chronic kidney disease) stage 3, GFR 30-59 ml/min (HCC)   Reviewed CKD with patient, CKD handout provided Rec tighter BP control - goal <130/80 if able to tolerate this. Will start amlodipine  2.5mg  daily, update if any  trouble tolerating.       Other Visit Diagnoses       Need for vaccination against Streptococcus pneumoniae       Relevant Orders   Pneumococcal conjugate vaccine 20-valent (Prevnar 20) (Completed)        Meds ordered this encounter  Medications   omeprazole  (PRILOSEC) 40 MG capsule    Sig: Take 1 capsule (40 mg total) by mouth daily.    Dispense:  90 capsule    Refill:  3   amLODipine  (NORVASC ) 2.5 MG tablet    Sig: Take 1 tablet (2.5 mg total) by mouth daily.    Dispense:  90 tablet    Refill:  3    New BP medicine in addition to other regimen    Orders Placed This Encounter  Procedures   DG Bone Density    Standing Status:   Future    Expiration Date:   09/18/2025    Reason for Exam (SYMPTOM  OR DIAGNOSIS REQUIRED):   osteoporosis    Preferred imaging location?:   [facility]   Pneumococcal conjugate vaccine 20-valent (Prevnar 20)    Patient Instructions  Call to to schedule bone density scan at your convenience: [facility] [phone] Prevnar-20 today (pneumonia shot) Ask pharmacy for dates of shingles shot to update your chart.  Increase water intake for kidney health. Kidney disease handout provided today  Start amlodipine  2.5mg  daily for better blood pressure control.  Bring us  a copy of your advanced directives Return as needed or in 1 year for follow up visit   Follow up plan: Return in about 1 year (around 09/18/2025) for medicare wellness visit.  Anton Blas, MD

## 2024-10-04 ENCOUNTER — Ambulatory Visit
Admission: RE | Admit: 2024-10-04 | Discharge: 2024-10-04 | Disposition: A | Discharge status: Home / Self Care | Source: Ambulatory Visit | Attending: Family Medicine | Admitting: Family Medicine

## 2024-10-04 DIAGNOSIS — M810 Age-related osteoporosis without current pathological fracture: Secondary | ICD-10-CM | POA: Diagnosis present

## 2024-10-08 DIAGNOSIS — M810 Age-related osteoporosis without current pathological fracture: Secondary | ICD-10-CM

## 2024-10-25 ENCOUNTER — Other Ambulatory Visit: Payer: Self-pay | Admitting: Student

## 2024-10-25 DIAGNOSIS — I480 Paroxysmal atrial fibrillation: Secondary | ICD-10-CM

## 2025-03-21 ENCOUNTER — Ambulatory Visit
